# Patient Record
Sex: Male | Born: 1955 | Race: White | Hispanic: No | Smoking: Former smoker
Health system: Southern US, Community
[De-identification: ages and names within clinical notes are randomized; demographics above are authoritative.]

## PROBLEM LIST (undated history)

## (undated) DIAGNOSIS — I10 Essential (primary) hypertension: Secondary | ICD-10-CM

## (undated) DIAGNOSIS — E785 Hyperlipidemia, unspecified: Secondary | ICD-10-CM

---

## 2006-07-19 ENCOUNTER — Encounter: Payer: Self-pay | Admitting: General Practice

## 2006-07-20 ENCOUNTER — Encounter: Payer: Self-pay | Admitting: General Practice

## 2006-08-20 ENCOUNTER — Encounter: Payer: Self-pay | Admitting: General Practice

## 2006-09-19 ENCOUNTER — Encounter: Payer: Self-pay | Admitting: General Practice

## 2019-02-18 ENCOUNTER — Other Ambulatory Visit: Payer: Self-pay

## 2019-02-18 DIAGNOSIS — Z20822 Contact with and (suspected) exposure to covid-19: Secondary | ICD-10-CM

## 2019-02-21 LAB — NOVEL CORONAVIRUS, NAA: SARS-CoV-2, NAA: DETECTED — AB

## 2019-02-22 ENCOUNTER — Encounter: Payer: Self-pay | Admitting: Emergency Medicine

## 2019-02-22 ENCOUNTER — Emergency Department
Admission: EM | Admit: 2019-02-22 | Discharge: 2019-02-22 | Disposition: A | Discharge status: Other Acute Inpatient Hospital | Payer: Medicaid Other | Attending: Emergency Medicine | Admitting: Emergency Medicine

## 2019-02-22 ENCOUNTER — Emergency Department: Payer: Medicaid Other

## 2019-02-22 ENCOUNTER — Other Ambulatory Visit: Payer: Self-pay

## 2019-02-22 ENCOUNTER — Encounter (HOSPITAL_COMMUNITY): Payer: Self-pay | Admitting: Family Medicine

## 2019-02-22 ENCOUNTER — Inpatient Hospital Stay (HOSPITAL_COMMUNITY)
Admission: AD | Admit: 2019-02-22 | Discharge: 2019-03-22 | DRG: 207 | Disposition: E | Payer: Medicaid Other | Source: Other Acute Inpatient Hospital | Attending: Pulmonary Disease | Admitting: Pulmonary Disease

## 2019-02-22 DIAGNOSIS — R042 Hemoptysis: Secondary | ICD-10-CM | POA: Diagnosis not present

## 2019-02-22 DIAGNOSIS — Z6830 Body mass index (BMI) 30.0-30.9, adult: Secondary | ICD-10-CM

## 2019-02-22 DIAGNOSIS — Z23 Encounter for immunization: Secondary | ICD-10-CM

## 2019-02-22 DIAGNOSIS — D696 Thrombocytopenia, unspecified: Secondary | ICD-10-CM | POA: Diagnosis not present

## 2019-02-22 DIAGNOSIS — J159 Unspecified bacterial pneumonia: Secondary | ICD-10-CM | POA: Diagnosis not present

## 2019-02-22 DIAGNOSIS — D649 Anemia, unspecified: Secondary | ICD-10-CM | POA: Diagnosis present

## 2019-02-22 DIAGNOSIS — Z01818 Encounter for other preprocedural examination: Secondary | ICD-10-CM

## 2019-02-22 DIAGNOSIS — Z66 Do not resuscitate: Secondary | ICD-10-CM | POA: Diagnosis not present

## 2019-02-22 DIAGNOSIS — J1281 Pneumonia due to SARS-associated coronavirus: Secondary | ICD-10-CM | POA: Diagnosis not present

## 2019-02-22 DIAGNOSIS — J1289 Other viral pneumonia: Secondary | ICD-10-CM | POA: Diagnosis present

## 2019-02-22 DIAGNOSIS — I10 Essential (primary) hypertension: Secondary | ICD-10-CM | POA: Diagnosis present

## 2019-02-22 DIAGNOSIS — I493 Ventricular premature depolarization: Secondary | ICD-10-CM | POA: Diagnosis not present

## 2019-02-22 DIAGNOSIS — R040 Epistaxis: Secondary | ICD-10-CM | POA: Diagnosis not present

## 2019-02-22 DIAGNOSIS — Z515 Encounter for palliative care: Secondary | ICD-10-CM | POA: Diagnosis not present

## 2019-02-22 DIAGNOSIS — U071 COVID-19: Secondary | ICD-10-CM | POA: Insufficient documentation

## 2019-02-22 DIAGNOSIS — I82441 Acute embolism and thrombosis of right tibial vein: Secondary | ICD-10-CM | POA: Diagnosis present

## 2019-02-22 DIAGNOSIS — R0602 Shortness of breath: Secondary | ICD-10-CM | POA: Diagnosis present

## 2019-02-22 DIAGNOSIS — J9601 Acute respiratory failure with hypoxia: Secondary | ICD-10-CM | POA: Diagnosis present

## 2019-02-22 DIAGNOSIS — R7989 Other specified abnormal findings of blood chemistry: Secondary | ICD-10-CM | POA: Diagnosis not present

## 2019-02-22 DIAGNOSIS — J1282 Pneumonia due to coronavirus disease 2019: Secondary | ICD-10-CM | POA: Diagnosis present

## 2019-02-22 DIAGNOSIS — E669 Obesity, unspecified: Secondary | ICD-10-CM | POA: Diagnosis present

## 2019-02-22 DIAGNOSIS — E1165 Type 2 diabetes mellitus with hyperglycemia: Secondary | ICD-10-CM | POA: Diagnosis present

## 2019-02-22 DIAGNOSIS — R9431 Abnormal electrocardiogram [ECG] [EKG]: Secondary | ICD-10-CM | POA: Diagnosis not present

## 2019-02-22 DIAGNOSIS — E874 Mixed disorder of acid-base balance: Secondary | ICD-10-CM | POA: Diagnosis present

## 2019-02-22 DIAGNOSIS — Z79899 Other long term (current) drug therapy: Secondary | ICD-10-CM

## 2019-02-22 DIAGNOSIS — J80 Acute respiratory distress syndrome: Secondary | ICD-10-CM

## 2019-02-22 DIAGNOSIS — J982 Interstitial emphysema: Secondary | ICD-10-CM | POA: Diagnosis not present

## 2019-02-22 DIAGNOSIS — R34 Anuria and oliguria: Secondary | ICD-10-CM | POA: Diagnosis not present

## 2019-02-22 DIAGNOSIS — E785 Hyperlipidemia, unspecified: Secondary | ICD-10-CM | POA: Diagnosis present

## 2019-02-22 DIAGNOSIS — D751 Secondary polycythemia: Secondary | ICD-10-CM | POA: Diagnosis not present

## 2019-02-22 DIAGNOSIS — N179 Acute kidney failure, unspecified: Secondary | ICD-10-CM | POA: Diagnosis not present

## 2019-02-22 DIAGNOSIS — J939 Pneumothorax, unspecified: Secondary | ICD-10-CM

## 2019-02-22 DIAGNOSIS — E871 Hypo-osmolality and hyponatremia: Secondary | ICD-10-CM | POA: Diagnosis present

## 2019-02-22 DIAGNOSIS — Z452 Encounter for adjustment and management of vascular access device: Secondary | ICD-10-CM

## 2019-02-22 DIAGNOSIS — Z9689 Presence of other specified functional implants: Secondary | ICD-10-CM

## 2019-02-22 DIAGNOSIS — R579 Shock, unspecified: Secondary | ICD-10-CM | POA: Diagnosis not present

## 2019-02-22 DIAGNOSIS — R0902 Hypoxemia: Secondary | ICD-10-CM

## 2019-02-22 HISTORY — DX: Essential (primary) hypertension: I10

## 2019-02-22 HISTORY — DX: Hyperlipidemia, unspecified: E78.5

## 2019-02-22 LAB — C-REACTIVE PROTEIN: CRP: 14.7 mg/dL — ABNORMAL HIGH (ref <1.0)

## 2019-02-22 LAB — CBC WITH DIFFERENTIAL/PLATELET
Abs Immature Granulocytes: 0.13 10*3/uL — ABNORMAL HIGH (ref 0.00–0.07)
Basophils Absolute: 0 10*3/uL (ref 0.0–0.1)
Basophils Relative: 0 %
Eosinophils Absolute: 0 10*3/uL (ref 0.0–0.5)
Eosinophils Relative: 0 %
HCT: 45 % (ref 39.0–52.0)
Hemoglobin: 16.1 g/dL (ref 13.0–17.0)
Immature Granulocytes: 1 %
Lymphocytes Relative: 8 %
Lymphs Abs: 0.7 10*3/uL (ref 0.7–4.0)
MCH: 29.2 pg (ref 26.0–34.0)
MCHC: 35.8 g/dL (ref 30.0–36.0)
MCV: 81.5 fL (ref 80.0–100.0)
Monocytes Absolute: 0.2 10*3/uL (ref 0.1–1.0)
Monocytes Relative: 2 %
Neutro Abs: 8.2 10*3/uL — ABNORMAL HIGH (ref 1.7–7.7)
Neutrophils Relative %: 89 %
Platelets: 213 10*3/uL (ref 150–400)
RBC: 5.52 MIL/uL (ref 4.22–5.81)
RDW: 12.5 % (ref 11.5–15.5)
Smear Review: NORMAL
WBC: 9.2 10*3/uL (ref 4.0–10.5)
nRBC: 0 % (ref 0.0–0.2)

## 2019-02-22 LAB — URINALYSIS, COMPLETE (UACMP) WITH MICROSCOPIC
Bacteria, UA: NONE SEEN
Bilirubin Urine: NEGATIVE
Glucose, UA: NEGATIVE mg/dL
Hgb urine dipstick: NEGATIVE
Ketones, ur: 20 mg/dL — AB
Leukocytes,Ua: NEGATIVE
Nitrite: NEGATIVE
Protein, ur: 100 mg/dL — AB
Specific Gravity, Urine: 1.029 (ref 1.005–1.030)
Squamous Epithelial / HPF: NONE SEEN (ref 0–5)
pH: 5 (ref 5.0–8.0)

## 2019-02-22 LAB — COMPREHENSIVE METABOLIC PANEL
ALT: 39 U/L (ref 0–44)
AST: 74 U/L — ABNORMAL HIGH (ref 15–41)
Albumin: 2.9 g/dL — ABNORMAL LOW (ref 3.5–5.0)
Alkaline Phosphatase: 97 U/L (ref 38–126)
Anion gap: 16 — ABNORMAL HIGH (ref 5–15)
BUN: 19 mg/dL (ref 8–23)
CO2: 21 mmol/L — ABNORMAL LOW (ref 22–32)
Calcium: 8 mg/dL — ABNORMAL LOW (ref 8.9–10.3)
Chloride: 85 mmol/L — ABNORMAL LOW (ref 98–111)
Creatinine, Ser: 0.87 mg/dL (ref 0.61–1.24)
GFR calc Af Amer: >60 mL/min (ref >60)
GFR calc non Af Amer: >60 mL/min (ref >60)
Glucose, Bld: 140 mg/dL — ABNORMAL HIGH (ref 70–99)
Potassium: 4 mmol/L (ref 3.5–5.1)
Sodium: 122 mmol/L — ABNORMAL LOW (ref 135–145)
Total Bilirubin: 1.1 mg/dL (ref 0.3–1.2)
Total Protein: 7.3 g/dL (ref 6.5–8.1)

## 2019-02-22 LAB — OSMOLALITY, URINE: Osmolality, Ur: 877 mOsm/kg (ref 300–900)

## 2019-02-22 LAB — PROCALCITONIN: Procalcitonin: 0.28 ng/mL

## 2019-02-22 LAB — SODIUM, URINE, RANDOM: Sodium, Ur: <10 mmol/L

## 2019-02-22 LAB — FIBRIN DERIVATIVES D-DIMER (ARMC ONLY): Fibrin derivatives D-dimer (ARMC): 4705.69 ng/mL (FEU) — ABNORMAL HIGH (ref 0.00–499.00)

## 2019-02-22 LAB — LACTIC ACID, PLASMA: Lactic Acid, Venous: 1.9 mmol/L (ref 0.5–1.9)

## 2019-02-22 LAB — TROPONIN I (HIGH SENSITIVITY)
Troponin I (High Sensitivity): 18 ng/L — ABNORMAL HIGH (ref <18)
Troponin I (High Sensitivity): 21 ng/L — ABNORMAL HIGH (ref <18)

## 2019-02-22 LAB — BRAIN NATRIURETIC PEPTIDE: B Natriuretic Peptide: 53 pg/mL (ref 0.0–100.0)

## 2019-02-22 LAB — FERRITIN: Ferritin: 1419 ng/mL — ABNORMAL HIGH (ref 24–336)

## 2019-02-22 MED ORDER — SODIUM CHLORIDE 0.9% FLUSH
3.0000 mL | Freq: Two times a day (BID) | INTRAVENOUS | Status: DC
  Administered 2019-02-22 – 2019-03-16 (×43): 3 mL via INTRAVENOUS

## 2019-02-22 MED ORDER — SODIUM CHLORIDE 0.9 % IV SOLN: 200.0000 mg | Freq: Once | INTRAVENOUS | Status: DC

## 2019-02-22 MED ORDER — ALPRAZOLAM 0.5 MG PO TABS
0.5000 mg | ORAL_TABLET | Freq: Every evening | ORAL | Status: DC | PRN
  Administered 2019-02-23: 0.5 mg via ORAL
  Filled 2019-02-22: qty 1

## 2019-02-22 MED ORDER — DEXAMETHASONE SODIUM PHOSPHATE 10 MG/ML IJ SOLN
6.0000 mg | INTRAMUSCULAR | Status: DC
  Administered 2019-02-23 – 2019-03-05 (×11): 6 mg via INTRAVENOUS
  Filled 2019-02-22 (×10): qty 1

## 2019-02-22 MED ORDER — ENOXAPARIN SODIUM 40 MG/0.4ML ~~LOC~~ SOLN: 40.0000 mg | Freq: Two times a day (BID) | SUBCUTANEOUS | Status: DC

## 2019-02-22 MED ORDER — LABETALOL HCL 5 MG/ML IV SOLN: 10.0000 mg | INTRAVENOUS | Status: DC | PRN

## 2019-02-22 MED ORDER — ACETAMINOPHEN 325 MG PO TABS
650.0000 mg | ORAL_TABLET | Freq: Four times a day (QID) | ORAL | Status: DC | PRN
  Administered 2019-02-28 – 2019-03-06 (×5): 650 mg via ORAL
  Filled 2019-02-22 (×5): qty 2

## 2019-02-22 MED ORDER — SODIUM CHLORIDE 0.9 % IV SOLN
100.0000 mg | Freq: Every day | INTRAVENOUS | Status: AC
  Administered 2019-02-23 – 2019-02-26 (×4): 100 mg via INTRAVENOUS
  Filled 2019-02-22 (×5): qty 20

## 2019-02-22 MED ORDER — ONDANSETRON HCL 4 MG PO TABS: 4.0000 mg | ORAL_TABLET | Freq: Four times a day (QID) | ORAL | Status: DC | PRN

## 2019-02-22 MED ORDER — SODIUM CHLORIDE 0.9 % IV SOLN
100.0000 mg | Freq: Every day | INTRAVENOUS | Status: DC
  Filled 2019-02-22: qty 20

## 2019-02-22 MED ORDER — ROSUVASTATIN CALCIUM 20 MG PO TABS
20.0000 mg | ORAL_TABLET | Freq: Every day | ORAL | Status: DC
  Administered 2019-02-23 – 2019-03-07 (×13): 20 mg via ORAL
  Filled 2019-02-22: qty 1
  Filled 2019-02-22: qty 4
  Filled 2019-02-22: qty 1
  Filled 2019-02-22: qty 4
  Filled 2019-02-22: qty 1
  Filled 2019-02-22: qty 4
  Filled 2019-02-22 (×4): qty 1
  Filled 2019-02-22 (×5): qty 4

## 2019-02-22 MED ORDER — SODIUM CHLORIDE 0.9 % IV SOLN
2.0000 g | INTRAVENOUS | Status: AC
  Administered 2019-02-22 – 2019-02-26 (×5): 2 g via INTRAVENOUS
  Filled 2019-02-22 (×5): qty 20

## 2019-02-22 MED ORDER — ENOXAPARIN SODIUM 40 MG/0.4ML ~~LOC~~ SOLN: 40.0000 mg | SUBCUTANEOUS | Status: DC

## 2019-02-22 MED ORDER — AZITHROMYCIN 250 MG PO TABS
500.0000 mg | ORAL_TABLET | Freq: Every day | ORAL | Status: DC
  Administered 2019-02-22: 500 mg via ORAL
  Filled 2019-02-22: qty 2

## 2019-02-22 MED ORDER — SODIUM CHLORIDE 0.9% FLUSH
3.0000 mL | Freq: Two times a day (BID) | INTRAVENOUS | Status: DC
  Administered 2019-02-22 – 2019-02-23 (×2): 3 mL via INTRAVENOUS

## 2019-02-22 MED ORDER — ONDANSETRON HCL 4 MG/2ML IJ SOLN: 4.0000 mg | Freq: Four times a day (QID) | INTRAMUSCULAR | Status: DC | PRN

## 2019-02-22 MED ORDER — DEXAMETHASONE SODIUM PHOSPHATE 10 MG/ML IJ SOLN
10.0000 mg | Freq: Once | INTRAMUSCULAR | Status: AC
  Administered 2019-02-22: 10 mg via INTRAVENOUS
  Filled 2019-02-22: qty 1

## 2019-02-22 MED ORDER — ENOXAPARIN SODIUM 40 MG/0.4ML ~~LOC~~ SOLN
40.0000 mg | SUBCUTANEOUS | Status: DC
  Administered 2019-02-22: 40 mg via SUBCUTANEOUS
  Filled 2019-02-22: qty 0.4

## 2019-02-22 MED ORDER — DOXYCYCLINE HYCLATE 100 MG PO TABS
100.0000 mg | ORAL_TABLET | Freq: Two times a day (BID) | ORAL | Status: DC
  Administered 2019-02-23 (×2): 100 mg via ORAL
  Filled 2019-02-22 (×3): qty 1

## 2019-02-22 MED ORDER — SODIUM CHLORIDE 0.9 % IV SOLN: 250.0000 mL | INTRAVENOUS | Status: DC | PRN

## 2019-02-22 MED ORDER — SODIUM CHLORIDE 0.9 % IV SOLN: 100.0000 mg | Freq: Every day | INTRAVENOUS | Status: DC

## 2019-02-22 MED ORDER — SODIUM CHLORIDE 0.9 % IV SOLN
200.0000 mg | Freq: Once | INTRAVENOUS | Status: AC
  Administered 2019-02-22: 200 mg via INTRAVENOUS
  Filled 2019-02-22: qty 40

## 2019-02-22 MED ORDER — HYDROCODONE-ACETAMINOPHEN 5-325 MG PO TABS
1.0000 | ORAL_TABLET | ORAL | Status: DC | PRN
  Administered 2019-03-06: 1 via ORAL
  Filled 2019-02-22: qty 1

## 2019-02-22 MED ORDER — SODIUM CHLORIDE 0.9% FLUSH: 3.0000 mL | INTRAVENOUS | Status: DC | PRN

## 2019-02-22 NOTE — ED Notes (Addendum)
Pt placed back on non rebreather at 15L due to oxygen sat decreasing to 79% on high flow. Pt oxygen sat increased to 90% on non rebreather

## 2019-02-22 NOTE — Progress Notes (Signed)
Anticoagulation monitoring(Lovenox):  63yo  M ordered Lovenox 40 mg Q24h  Filed Weights   Mar 11, 2019 1224  Weight: 190 lb (86.2 kg)   BMI   Lab Results  Component Value Date   CREATININE 0.87 Mar 11, 2019   Estimated Creatinine Clearance: 86.6 mL/min (by C-G formula based on SCr of 0.87 mg/dL). Hemoglobin & Hematocrit     Component Value Date/Time   HGB 16.1 March 11, 2019 1234   HCT 45.0 03-11-2019 1234     PerCovid-19 Protocol for Patient with estCrcl > 30 ml/min and Fibrin D-Dimer>[address] [facility]) and discussion with ED MD, will transition to Lovenox 40 mg Q12h.     Chinita Greenland PharmD Clinical Pharmacist 11-Mar-2019

## 2019-02-22 NOTE — ED Triage Notes (Signed)
Pt in via POV, reports worsening shortness of breath, pt is Covid positive.  Room air saturation 67% in triage.  Pt placed on 6L nasal cannula, roomed at this time.  EDP, [place] notified and to bedside.

## 2019-02-22 NOTE — Treatment Plan (Addendum)
[facility] to [place] 63 yo with hx HTN and HLD who presented to ED with SOB.  COVID + as of 11/30 with subsequent worsening over the past few days.  Found to have O2 sats in the 60's on RA on arrival.  Per EDP, looks more comfortable on 6 L at this time.  Vitals notable for tachypnea to 24.  Normal HR.  Afebrile.  BP on the high side.  Labs notable for hyponatremia.  Mildly elevated AST.  CBC wnl.  Mild AGMA.  Procalcitonin pending.  D dimer elevated to 4705.69 (upper limit normal 499).  Troponin mildly elevated, delta pending.  S/p steroids from EDP.  Requested remdesivir and dose of prophylactic lovenox. If patient stable for CT PE protocol, requested this as well.  Regarding hyponatremia, pt appears euvolemic per EDP.  Will order urine studies and serum osm.  Also requested lactate. Can consider gentle bolus and repeat sodium based on this and exam with mild AGMA.  Accepted to [place].    Addendum: Pt apparently desatted to 39's on 6 L [place].  Placed on NRB and now apparently in the low 90's.  He's been accepted to [facility] progressive bed at [facility].  Given worsening status and unclear timeline for transfer, I've asked one of my colleagues at [facility] to see and evaluate Mr. Eberwein for additional therapies (convalescent plasma +/- actemra) while he awaits transfer.

## 2019-02-22 NOTE — ED Notes (Signed)
Pt placed on nonrebreather.  

## 2019-02-22 NOTE — ED Notes (Signed)
Pt placed on high flow by respiratory at this time

## 2019-02-22 NOTE — Progress Notes (Signed)
Remdesivir - Pharmacy Brief Note   O:  ALT: 39 CXR: Diffuse interstitial and airspace opacity throughout both lungs. SpO2: 67% on room air   A/P:  Remdesivir 200 mg IVPB once followed by 100 mg IVPB daily x 4 days.   .me 03/04/2019 2:19 PM

## 2019-02-22 NOTE — Plan of Care (Signed)
[facility] PROGRESS NOTE  Patient: Zariah Jost GUR:[id number]   PCP: Patient, No Pcp Per DOB: 03-20-1956   DOA: 02/21/2019   DOS: 02/21/2019    Subjective: Requested by Dr. Florene Glen to see the patient while waiting for transfer to [place]. Patient continues to have shortness of breath. Denies any chest pain. No nausea or vomiting.  Objective:  Vitals:   03/08/2019 1615 03/01/2019 1627  BP:    Pulse: 95 95  Resp:    Temp:    SpO2: 90% 91%    Bilateral crackles. S1-S2 present.  Assessment and plan: COVID-19 infection. Acute hypoxic respiratory failure. Patient is on remdesivir as well as received 10 mg of Decadron. Discussed with patient regarding potential future fitment option including Actemra as well as convalescent plasma. Provided patient the consent form as well. Patient currently wants to think about the medicines as well as plasma and unsure about his decision. Informed him that he should discuss with his family and next of kin regarding his preferences so that in the event of acute worsening appropriate decisions are made. All questions answered.  Author: Berle Mull, MD [facility] 03/07/2019 4:34 PM   If 7PM-7AM, please contact night-coverage at [email or web address]

## 2019-02-22 NOTE — H&P (Signed)
History and Physical    Alexander Erickson [id number] DOB: Dec 15, 1955 DOA: 02/21/2019  PCP: Patient, No Pcp Per   Patient coming from: Home   Chief Complaint: SOB   HPI: Alexander Erickson is a 63 y.o. male with medical history significant for hypertension and hyperlipidemia, presenting to the emergency department with several days of cough and progressive shortness of breath.  Patient reports he been in his usual state of health until approximately 1 week ago when he developed cough and some mild dyspnea.  He went on to develop intermittent fevers and chills, and has had progression in his shortness of breath that prompted presentation to the ED today.  Patient's cough has been productive of thick sputum.  He denies any chest pain and denies any swelling or tenderness in the lower extremities.     [facility] ED Course: Upon arrival to the ED, patient is found to be afebrile, saturating in the upper 60s on room air, tachypneic in the low 30s, and with stable blood pressure.  EKG features a sinus rhythm with PVCs and QTc interval of 536 ms.  Chemistry panel is notable for a sodium of 122 and albumin 2.9, as well as AST 74.  CBC is unremarkable.  Lactic acid is normal.  High-sensitivity troponin is slightly elevated and flat.  Urinalysis notable for proteinuria and ketonuria.  Procalcitonin was 0.28, COVID-19 positive, urine osm 877, and urine sodium undetectably low.  Patient was treated with Decadron and remdesivir in the emergency department and started on supplemental oxygen.  Review of Systems:  All other systems reviewed and apart from HPI, are negative.  Past Medical History:  Diagnosis Date  . Hyperlipidemia   . Hypertension     History reviewed. No pertinent surgical history.   has no history on file for tobacco, alcohol, and drug.  No Known Allergies  History reviewed. No pertinent family history.   Prior to Admission medications   Medication Sig Start Date End  Date Taking? Authorizing Provider  ALPRAZolam Prudy Feeler(XANAX) 0.5 MG tablet Take 0.5 mg by mouth at bedtime as needed for anxiety.    [provider]  rosuvastatin (CRESTOR) 20 MG tablet Take 20 mg by mouth daily.    [provider]    Physical Exam: Vitals:   02/28/2019 1906 03/20/2019 1945 02/19/2019 2000  BP: (!) 161/80 (!) 150/83 (!) 167/90  Pulse: 91 90 91  Resp: (!) 21 (!) 25 (!) 22  Temp: 99.6 F (37.6 C)  98.5 F (36.9 C)  TempSrc: Oral  Oral  SpO2: (!) 89% (!) 87% 91%  Weight:   85 kg  Height:   5' 5.5" (1.664 m)    Constitutional: NAD, calm  Eyes: PERTLA, lids and conjunctivae normal ENMT: Mucous membranes are moist. Posterior pharynx clear of any exudate or lesions.   Neck: normal, supple, no masses, no thyromegaly Respiratory: Tachypneic. Speaking full sentences. No pallor or cyanosis.  Cardiovascular: S1 & S2 heard, regular rate and rhythm. No extremity edema.   Abdomen: No distension, no tenderness, soft. Bowel sounds active.  Musculoskeletal: no clubbing / cyanosis. No joint deformity upper and lower extremities.    Skin: no significant rashes, lesions, ulcers. Warm, dry, well-perfused. Neurologic: No gross facial asymmetry. Sensation intact. Moving all extremities.  Psychiatric: Alert and oriented to person, place, and situation. Pleasant, cooperative.     Labs on Admission: I have personally reviewed following labs and imaging studies  CBC: Recent Labs  Lab 03/03/2019 1234  WBC 9.2  NEUTROABS 8.2*  HGB 16.1  HCT 45.0  MCV 81.5  PLT 213   Basic Metabolic Panel: Recent Labs  Lab 03/20/2019 1234  NA 122*  K 4.0  CL 85*  CO2 21*  GLUCOSE 140*  BUN 19  CREATININE 0.87  CALCIUM 8.0*   GFR: Estimated Creatinine Clearance: 86.9 mL/min (by C-G formula based on SCr of 0.87 mg/dL). Liver Function Tests: Recent Labs  Lab 02/21/2019 1234  AST 74*  ALT 39  ALKPHOS 97  BILITOT 1.1  PROT 7.3  ALBUMIN 2.9*   No results for input(s): LIPASE,  AMYLASE in the last 168 hours. No results for input(s): AMMONIA in the last 168 hours. Coagulation Profile: No results for input(s): INR, PROTIME in the last 168 hours. Cardiac Enzymes: No results for input(s): CKTOTAL, CKMB, CKMBINDEX, TROPONINI in the last 168 hours. BNP (last 3 results) No results for input(s): PROBNP in the last 8760 hours. HbA1C: No results for input(s): HGBA1C in the last 72 hours. CBG: No results for input(s): GLUCAP in the last 168 hours. Lipid Profile: No results for input(s): CHOL, HDL, LDLCALC, TRIG, CHOLHDL, LDLDIRECT in the last 72 hours. Thyroid Function Tests: No results for input(s): TSH, T4TOTAL, FREET4, T3FREE, THYROIDAB in the last 72 hours. Anemia Panel: Recent Labs    03/16/2019 1234  FERRITIN 1,419*   Urine analysis:    Component Value Date/Time   COLORURINE YELLOW (A) 03/16/2019 1436   APPEARANCEUR CLEAR (A) 03/01/2019 1436   LABSPEC 1.029 02/21/2019 1436   PHURINE 5.0 03/13/2019 1436   GLUCOSEU NEGATIVE 03/18/2019 1436   HGBUR NEGATIVE 03/21/2019 1436   BILIRUBINUR NEGATIVE 03/07/2019 1436   KETONESUR 20 (A) 03/14/2019 1436   PROTEINUR 100 (A) 03/02/2019 1436   NITRITE NEGATIVE 03/02/2019 1436   LEUKOCYTESUR NEGATIVE 03/04/2019 1436   Sepsis Labs: @LABRCNTIP (procalcitonin:4,lacticidven:4) ) Recent Results (from the past 240 hour(s))  Novel Coronavirus, NAA ([facility])     Status: Abnormal   Collection Time: 02/18/19  1:26 PM   Specimen: Nasopharyngeal(NP) swabs in vial transport medium   NASOPHARYNGE  TESTING  Result Value Ref Range Status   SARS-CoV-2, NAA Detected (A) Not Detected Final    Comment: This nucleic acid amplification test was developed and its performance characteristics determined by 02/20/19. Nucleic acid amplification tests include PCR and TMA. This test has not been FDA cleared or approved. This test has been authorized by FDA under an Emergency Use Authorization (EUA). This test is only authorized  for the duration of time the declaration that circumstances exist justifying the authorization of the emergency use of in vitro diagnostic tests for detection of SARS-CoV-2 virus and/or diagnosis of COVID-19 infection under section 564(b)(1) of the Act, 21 U.S.C. [employer]) (1), unless the authorization is terminated or revoked sooner. When diagnostic testing is negative, the possibility of a false negative result should be considered in the context of a patient's recent exposures and the presence of clinical signs and symptoms consistent with COVID-19. An individual without symptoms of COVID-19 and who is not shedding SARS-CoV-2 virus would  expect to have a negative (not detected) result in this assay.      Radiological Exams on Admission: Dg Chest Portable 1 View  Result Date: 02/23/2019 CLINICAL DATA:  Shortness of breath, COVID positive EXAM: PORTABLE CHEST 1 VIEW COMPARISON:  None. FINDINGS: Cardiomediastinal contours are normal. There is diffuse interstitial and airspace opacity throughout both the right and left chest. No signs of dense consolidation or evidence of pleural effusion. No acute bone finding.  IMPRESSION: Diffuse interstitial and airspace opacity throughout both lungs. Findings which could be seen in viral or atypical pneumonia, also with COVID-19 infection. Asymmetric pulmonary edema could have similar appearance. Electronically Signed   By: Zetta Bills M.D.   On: 03/07/2019 13:01    EKG: Independently reviewed. Sinus rhythm, PVC's, LAD, QTc 536 ms.   Assessment/Plan   1. COVID-19 pneumonia; acute hypoxic respiratory failure  - Presents with 1 week of cough and SOB, found to have O2 saturation 67% on rm air with diffuse opacities bilaterally on CXR, was started on supplemental O2 and treated with dexamethasone and remdesivir in ED  - Continue supplemental O2, continue Decadron and remdesivir, start antibiotics and trend procalcitonin given productive cough and  elevated pct   2. 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 Prolonged QT interval  - QTc is 536 ms on admission  - Continue cardiac monitoring, minimize QT-prolonging medications, replace potassium to 4 and mag to 2, repeat EKG in am    DVT prophylaxis: Lovenox  Code Status: Full, confirmed with patient  Family Communication: Discussed with patient  Consults called: None  Admission status: Inpatient     Vianne Bulls, MD [facility] Pager [phone]  If 7PM-7AM, please contact night-coverage [email or web address] Password [email or web address]  03/16/2019, 9:00 PM

## 2019-02-22 NOTE — ED Provider Notes (Signed)
[facility] Emergency Department Provider Note   ____________________________________________   First MD Initiated Contact with Patient 02-28-19 1227     (approximate)  I have reviewed the triage vital signs and the nursing notes.   HISTORY  Chief Complaint Respiratory Distress and Covid    HPI Alexander Erickson is a 63 y.o. male with past medical history of hypertension and hyperlipidemia who presents to the ED complaining of shortness of breath.  Patient states he initially had some cough and difficulty breathing about a week ago, subsequently tested positive for COVID-19 5 days prior.  He states he has been having intermittent fever since then and his breathing has seemed to gradually get worse.  He describes the shortness of breath as severe earlier today, prompting him to seek care in the ED.  He complains of a productive cough, but denies any chest pain.  He has not noticed any pain or swelling in his lower extremities.  He has not been taking any medications for COVID-19.        Past Medical History:  Diagnosis Date  . Hyperlipidemia   . Hypertension     There are no active problems to display for this patient.   History reviewed. No pertinent surgical history.  Prior to Admission medications   Medication Sig Start Date End Date Taking? Authorizing Provider  ALPRAZolam Duanne Moron) 0.5 MG tablet Take 0.5 mg by mouth at bedtime as needed for anxiety.   Yes [provider]  rosuvastatin (CRESTOR) 20 MG tablet Take 20 mg by mouth daily.   Yes [provider]    Allergies Patient has no known allergies.  No family history on file.  Social History Social History   Tobacco Use  . Smoking status: Not on file  Substance Use Topics  . Alcohol use: Not on file  . Drug use: Not on file    Review of Systems  Constitutional: Positive for fever/chills Eyes: No visual changes. ENT: No sore throat. Cardiovascular: Denies chest  pain. Respiratory: Positive for cough and shortness of breath. Gastrointestinal: No abdominal pain.  No nausea, no vomiting.  No diarrhea.  No constipation. Genitourinary: Negative for dysuria. Musculoskeletal: Negative for back pain. Skin: Negative for rash. Neurological: Negative for headaches, focal weakness or numbness.  ____________________________________________   PHYSICAL EXAM:  VITAL SIGNS: ED Triage Vitals  Enc Vitals Group     BP 2019-02-28 1214 (!) 148/64     Pulse Rate 02-28-2019 1214 100     Resp 02-28-2019 1214 (!) 28     Temp 28-Feb-2019 1214 98.3 F (36.8 C)     Temp Source 28-Feb-2019 1214 Oral     SpO2 2019/02/28 1214 (!) 68 %     Weight 2019/02/28 1224 190 lb (86.2 kg)     Height 02-28-2019 1224 5\' 5"  (1.651 m)     Head Circumference --      Peak Flow --      Pain Score 28-Feb-2019 1223 0     Pain Loc --      Pain Edu? --      Excl. in [place]? --     Constitutional: Alert and oriented. Eyes: Conjunctivae are normal. Head: Atraumatic. Nose: No congestion/rhinnorhea. Mouth/Throat: Mucous membranes are moist. Neck: Normal ROM Cardiovascular: Normal rate, regular rhythm. Grossly normal heart sounds. Respiratory: Tachypneic, moderate respiratory distress.  No retractions.  Diffuse crackles noted. Gastrointestinal: Soft and nontender. No distention. Genitourinary: deferred Musculoskeletal: No lower extremity tenderness nor edema. Neurologic:  Normal speech  and language. No gross focal neurologic deficits are appreciated. Skin:  Skin is warm, dry and intact. No rash noted. Psychiatric: Mood and affect are normal. Speech and behavior are normal.  ____________________________________________   LABS (all labs ordered are listed, but only abnormal results are displayed)  Labs Reviewed  CBC WITH DIFFERENTIAL/PLATELET - Abnormal; Notable for the following components:      Result Value   Neutro Abs 8.2 (*)    Abs Immature Granulocytes 0.13 (*)    All other components within  normal limits  COMPREHENSIVE METABOLIC PANEL - Abnormal; Notable for the following components:   Sodium 122 (*)    Chloride 85 (*)    CO2 21 (*)    Glucose, Bld 140 (*)    Calcium 8.0 (*)    Albumin 2.9 (*)    AST 74 (*)    Anion gap 16 (*)    All other components within normal limits  FIBRIN DERIVATIVES D-DIMER ([facility] ONLY) - Abnormal; Notable for the following components:   Fibrin derivatives D-dimer [facility]) 4,705.69 (*)    All other components within normal limits  FERRITIN - Abnormal; Notable for the following components:   Ferritin 1,419 (*)    All other components within normal limits  URINALYSIS, COMPLETE (UACMP) WITH MICROSCOPIC - Abnormal; Notable for the following components:   Color, Urine YELLOW (*)    APPearance CLEAR (*)    Ketones, ur 20 (*)    Protein, ur 100 (*)    All other components within normal limits  TROPONIN I (HIGH SENSITIVITY) - Abnormal; Notable for the following components:   Troponin I (High Sensitivity) 18 (*)    All other components within normal limits  BRAIN NATRIURETIC PEPTIDE  SODIUM, URINE, RANDOM  C-REACTIVE PROTEIN  OSMOLALITY, URINE  LACTIC ACID, PLASMA  OSMOLALITY  LACTIC ACID, PLASMA  PROCALCITONIN  TROPONIN I (HIGH SENSITIVITY)   ____________________________________________  EKG  ED ECG REPORT I, Chesley Noon, the attending physician, personally viewed and interpreted this ECG.   Date: 03/05/2019  EKG Time: 12:08  Rate: 100  Rhythm: sinus tachycardia  Axis: LAD  Intervals:Prolonged QTc  ST&T Change: PAC, No ST/T changes   PROCEDURES  Procedure(s) performed (including Critical Care):  .Critical Care Performed by: Chesley Noon, MD Authorized by: Chesley Noon, MD   Critical care provider statement:    Critical care time (minutes):  45   Critical care time was exclusive of:  Separately billable procedures and treating other patients and teaching time   Critical care was necessary to treat or prevent imminent  or life-threatening deterioration of the following conditions:  Respiratory failure   Critical care was time spent personally by me on the following activities:  Discussions with consultants, evaluation of patient's response to treatment, examination of patient, ordering and performing treatments and interventions, ordering and review of laboratory studies, ordering and review of radiographic studies, pulse oximetry, re-evaluation of patient's condition, obtaining history from patient or surrogate and review of old charts   I assumed direction of critical care for this patient from another provider in my specialty: no       ____________________________________________   INITIAL IMPRESSION / ASSESSMENT AND PLAN / ED COURSE       63 year old male with history of hypertension and hyperlipidemia presents to the ED with worsening shortness of breath in the setting of positive COVID-19 testing 5 days prior.  He arrives in some respiratory distress, placed on 6 L nasal cannula after room air sats noted to be  in the 60s.  Given time to rest on the stretcher, he had gradual improvement in his work of breathing and is maintaining O2 sats greater than 90% on 6 L nasal cannula.  EKG shows sinus tachycardia with PAC, no evidence of ischemia.  Chest x-ray appears consistent with COVID-19 pneumonia, low suspicion for bacterial illness but will screen procalcitonin.  [facility] of COVID-[facility], was given dose of Decadron given hypoxia.  Chest x-ray shows multifocal pneumonia consistent with COVID-19.  Labs otherwise significant for hyponatremia with no obvious cause, patient does appear euvolemic at this time.  He did have an increase in his oxygen requirement after standing up to urinate, noted to be hypoxic to 80% on 6 L nasal cannula.  He was transitioned to nonrebreather with eventual improvement in his O2 sats to 92%.  We will place him on high flow  nasal cannula for now but I suspect he will be stable for [facility].  Case was [facility] Low[facility]ell [facility] who [facility].  We considered performing CTA to assess for PE, however given his respiratory status we will hold off for now and start prophylactic dose Lovenox.      ____________________________________________   FINAL CLINICAL IMPRESSION(S) / ED DIAGNOSES  Final diagnoses:  Pneumonia due to COVID-19 virus  Acute respiratory failure with hypoxia [facility](HCC)     ED Discharge Orders    None       Note:  This document was prepared using Dragon voice recognition software and may include unintentional dictation errors.   Chesley NoonJessup, Rosalene Wardrop, MD 02/27/2019 [phone]

## 2019-02-22 NOTE — ED Notes (Signed)
Report given to [facility] 

## 2019-02-23 ENCOUNTER — Encounter (HOSPITAL_COMMUNITY): Payer: Self-pay

## 2019-02-23 LAB — PHOSPHORUS: Phosphorus: 3 mg/dL (ref 2.5–4.6)

## 2019-02-23 LAB — CBC WITH DIFFERENTIAL/PLATELET
Abs Immature Granulocytes: 0.12 10*3/uL — ABNORMAL HIGH (ref 0.00–0.07)
Basophils Absolute: 0 10*3/uL (ref 0.0–0.1)
Basophils Relative: 0 %
Eosinophils Absolute: 0 10*3/uL (ref 0.0–0.5)
Eosinophils Relative: 0 %
HCT: 42.4 % (ref 39.0–52.0)
Hemoglobin: 14.8 g/dL (ref 13.0–17.0)
Immature Granulocytes: 2 %
Lymphocytes Relative: 10 %
Lymphs Abs: 0.7 10*3/uL (ref 0.7–4.0)
MCH: 29.5 pg (ref 26.0–34.0)
MCHC: 34.9 g/dL (ref 30.0–36.0)
MCV: 84.6 fL (ref 80.0–100.0)
Monocytes Absolute: 0.2 10*3/uL (ref 0.1–1.0)
Monocytes Relative: 3 %
Neutro Abs: 6.5 10*3/uL (ref 1.7–7.7)
Neutrophils Relative %: 85 %
Platelets: 209 10*3/uL (ref 150–400)
RBC: 5.01 MIL/uL (ref 4.22–5.81)
RDW: 12.5 % (ref 11.5–15.5)
WBC: 7.5 10*3/uL (ref 4.0–10.5)
nRBC: 0 % (ref 0.0–0.2)

## 2019-02-23 LAB — EXPECTORATED SPUTUM ASSESSMENT W GRAM STAIN, RFLX TO RESP C

## 2019-02-23 LAB — COMPREHENSIVE METABOLIC PANEL
ALT: 41 U/L (ref 0–44)
AST: 66 U/L — ABNORMAL HIGH (ref 15–41)
Albumin: 2.4 g/dL — ABNORMAL LOW (ref 3.5–5.0)
Alkaline Phosphatase: 109 U/L (ref 38–126)
Anion gap: 16 — ABNORMAL HIGH (ref 5–15)
BUN: 20 mg/dL (ref 8–23)
CO2: 23 mmol/L (ref 22–32)
Calcium: 8.1 mg/dL — ABNORMAL LOW (ref 8.9–10.3)
Chloride: 86 mmol/L — ABNORMAL LOW (ref 98–111)
Creatinine, Ser: 0.77 mg/dL (ref 0.61–1.24)
GFR calc Af Amer: >60 mL/min (ref >60)
GFR calc non Af Amer: >60 mL/min (ref >60)
Glucose, Bld: 122 mg/dL — ABNORMAL HIGH (ref 70–99)
Potassium: 3.9 mmol/L (ref 3.5–5.1)
Sodium: 125 mmol/L — ABNORMAL LOW (ref 135–145)
Total Bilirubin: 0.8 mg/dL (ref 0.3–1.2)
Total Protein: 6.7 g/dL (ref 6.5–8.1)

## 2019-02-23 LAB — C-REACTIVE PROTEIN: CRP: 17 mg/dL — ABNORMAL HIGH (ref <1.0)

## 2019-02-23 LAB — CORTISOL: Cortisol, Plasma: 7.3 ug/dL

## 2019-02-23 LAB — FERRITIN: Ferritin: 951 ng/mL — ABNORMAL HIGH (ref 24–336)

## 2019-02-23 LAB — MAGNESIUM: Magnesium: 2.1 mg/dL (ref 1.7–2.4)

## 2019-02-23 LAB — PROCALCITONIN: Procalcitonin: 0.28 ng/mL

## 2019-02-23 LAB — ABO/RH: ABO/RH(D): O POS

## 2019-02-23 LAB — D-DIMER, QUANTITATIVE: D-Dimer, Quant: >20 ug/mL-FEU — ABNORMAL HIGH (ref 0.00–0.50)

## 2019-02-23 LAB — HIV ANTIBODY (ROUTINE TESTING W REFLEX): HIV Screen 4th Generation wRfx: NONREACTIVE

## 2019-02-23 LAB — TSH: TSH: 0.579 u[IU]/mL (ref 0.350–4.500)

## 2019-02-23 MED ORDER — INFLUENZA VAC SPLIT QUAD 0.5 ML IM SUSY
0.5000 mL | PREFILLED_SYRINGE | INTRAMUSCULAR | Status: DC | PRN
  Filled 2019-02-23: qty 0.5

## 2019-02-23 MED ORDER — SALINE SPRAY 0.65 % NA SOLN
1.0000 | NASAL | Status: DC | PRN
  Administered 2019-02-23 – 2019-03-07 (×2): 1 via NASAL
  Filled 2019-02-23 (×2): qty 44

## 2019-02-23 MED ORDER — FUROSEMIDE 10 MG/ML IJ SOLN
60.0000 mg | Freq: Once | INTRAMUSCULAR | Status: AC
  Administered 2019-02-23: 60 mg via INTRAVENOUS
  Filled 2019-02-23: qty 6

## 2019-02-23 MED ORDER — INFLUENZA VAC SPLIT QUAD 0.5 ML IM SUSY: 0.5000 mL | PREFILLED_SYRINGE | INTRAMUSCULAR | Status: DC

## 2019-02-23 MED ORDER — ENOXAPARIN SODIUM 100 MG/ML ~~LOC~~ SOLN
1.0000 mg/kg | Freq: Two times a day (BID) | SUBCUTANEOUS | Status: DC
  Administered 2019-02-23 – 2019-02-27 (×8): 85 mg via SUBCUTANEOUS
  Filled 2019-02-23 (×12): qty 1

## 2019-02-23 MED ORDER — METOPROLOL TARTRATE 5 MG/5ML IV SOLN
5.0000 mg | Freq: Once | INTRAVENOUS | Status: AC
  Administered 2019-02-23: 5 mg via INTRAVENOUS
  Filled 2019-02-23: qty 5

## 2019-02-23 MED ORDER — METOPROLOL TARTRATE 25 MG PO TABS
12.5000 mg | ORAL_TABLET | Freq: Two times a day (BID) | ORAL | Status: DC
  Administered 2019-02-23 – 2019-02-28 (×10): 12.5 mg via ORAL
  Administered 2019-02-28: 25 mg via ORAL
  Administered 2019-03-01 – 2019-03-10 (×17): 12.5 mg via ORAL
  Filled 2019-02-23 (×29): qty 1

## 2019-02-23 NOTE — Progress Notes (Signed)
ANTICOAGULATION CONSULT NOTE - Initial Consult  Pharmacy Consult for Lovenox Indication: Rule out VTE  Allergies  Allergen Reactions  . Sulfur   . Thimerosal     Patient Measurements: Height: 5' 5.5" (166.4 cm) Weight: 187 lb 6.3 oz (85 kg) IBW/kg (Calculated) : 62.65  Vital Signs: Temp: 99.1 F (37.3 C) (12/05 1253) Temp Source: Oral (12/05 1253) BP: 148/76 (12/05 1253) Pulse Rate: 97 (12/05 1253)  Labs: Recent Labs    03/06/2019 1234 03/09/2019 1437 02/23/19 0307  HGB 16.1  --  14.8  HCT 45.0  --  42.4  PLT 213  --  209  CREATININE 0.87  --  0.77  TROPONINIHS 18* 21*  --     Estimated Creatinine Clearance: 94.5 mL/min (by C-G formula based on SCr of 0.77 mg/dL).   Medical History: Past Medical History:  Diagnosis Date  . Hyperlipidemia   . Hypertension     Medications:  Scheduled:  . dexamethasone (DECADRON) injection  6 mg Intravenous Q24H  . enoxaparin (LOVENOX) injection  1 mg/kg Subcutaneous Q12H  . furosemide  60 mg Intravenous Once  . metoprolol tartrate  5 mg Intravenous Once  . metoprolol tartrate  12.5 mg Oral BID  . rosuvastatin  20 mg Oral q1800  . sodium chloride flush  3 mL Intravenous Q12H   Infusions:  . cefTRIAXone (ROCEPHIN)  IV Stopped (03/06/2019 2230)  . remdesivir 100 mg in NS 100 mL 100 mg (02/23/19 0940)   PRN: acetaminophen, ALPRAZolam, HYDROcodone-acetaminophen, influenza vac split quadrivalent PF, labetalol, sodium chloride  Assessment: 63 yo male with COVID-19 pneumonia.  He has been receiving Lovenox 40mg  q24h for prophylaxis.  Today, pharmacy is consulted to change to full-dose given clinically suspected PE/DVT w/ markedly elevated d-dimer and other concerning clinical signs.  Weight 85kg SCr 0.77, CrCl ~95 ml/min Hgb 14.8, Plts 209 No bleeding reported  Goal of Therapy:  Anti-Xa level 0.6-1 units/ml 4hrs after LMWH dose given Monitor platelets by anticoagulation protocol: Yes   Plan:  Lovenox 85mg  (1mg /kg) SQ  q12h Monitor CBC, signs/symtoms of bleeding  Peggyann Juba, PharmD, BCPS Pharmacy: [phone] 02/23/2019,5:01 PM

## 2019-02-23 NOTE — Progress Notes (Addendum)
Alexander Erickson  ZOX:[id number] DOB: 06/28/1955 DOA: 03/11/2019 PCP: Patient, No Pcp Per    Brief Narrative:  63 year old with a history of HTN and HLD who presented to the ED with a week of progressive cough and shortness of breath.  He lives in the [place] area with his brother who is also been symptomatic.  In the ED he was found to have saturations in the upper 60s on room air and was tachypneic.  EKG noted multiple PVCs.  His sodium was depressed at 122 and he was confirmed to be Covid positive.  A CXR noted diffuse dense interstitial infiltrates.  Significant Events: 11/30 COVID test + 12/4 admit to [facility] via [facility] ED  COVID-19 specific Treatment: Decadron 12/4 > Remdesivir 12/4 >  Subjective: The patient appears short of breath at the time of my exam but is not in extremis.  He is able to complete conversation without pausing.  He denies chest pain.  He does report some nausea and poor appetite with poor intake.  His primary complaint is his shortness of breath.  Assessment & Plan:  COVID Pneumonia - acute hypoxic resp failure  Continue Decadron and remdesivir -I have discussed the pros and cons of Actemra use with the patient and have encouraged him that I would like to use it at this time -the patient is concerned about potential side effects and wishes to avoid its use for now -he has likewise been counseled on convalescent plasma and is not yet willing to commit to this therapy  Recent Labs  Lab 03/01/2019 1234 03/08/2019 1437 02/23/19 0307  DDIMER  --   --  >20.00*  FERRITIN 1,419*  --  951*  CRP 14.7*  --  17.0*  ALT 39  --  41  PROCALCITON  --  0.28 0.28   Markedly elevated D-dimer Will empirically treat with full dose Lovenox for now - check venous duplex lower extremities -do not feel patient is clinically stable enough to obtain CT angio at this time  Hyponatremia Appears euvolemic on exam -urine sodium less than 10 is not consistent with SIADH -follow-up TSH  -diurese and follow  Frequent cardiac ectopy -PVCs Initiate beta-blocker -likely precipitated by respiratory distress -worrisome for possibility of PE  Prolonged QTC Check EKG in a.m.  DVT prophylaxis: Full dose Lovenox Code Status: FULL CODE Family Communication:  Disposition Plan:   Consultants:  none  Antimicrobials:  None  Objective: Blood pressure (!) 162/81, pulse 93, temperature 98.3 F (36.8 C), temperature source Oral, resp. rate 19, height 5' 5.5" (1.664 m), weight 85 kg, SpO2 (!) 88 %.  Intake/Output Summary (Last 24 hours) at 02/23/2019 1035 Last data filed at 02/23/2019 0500 Gross per 24 hour  Intake 460 ml  Output -  Net 460 ml   Filed Weights   03/16/2019 2000  Weight: 85 kg    Examination: General: Mild respiratory distress but not in extremis Lungs: Coarse crackles diffusely Cardiovascular: Regular rate and rhythm without murmur gallop or rub normal S1 and S2 Abdomen: Nontender, nondistended, soft, bowel sounds positive, no rebound, no ascites, no appreciable mass Extremities: No significant cyanosis, clubbing, or edema bilateral lower extremities  CBC: Recent Labs  Lab 02/27/2019 1234 02/23/19 0307  WBC 9.2 7.5  NEUTROABS 8.2* 6.5  HGB 16.1 14.8  HCT 45.0 42.4  MCV 81.5 84.6  PLT 213 811   Basic Metabolic Panel: Recent Labs  Lab 03/02/2019 1234 02/23/19 0307  NA 122* 125*  K 4.0 3.9  CL  85* 86*  CO2 21* 23  GLUCOSE 140* 122*  BUN 19 20  CREATININE 0.87 0.77  CALCIUM 8.0* 8.1*  MG  --  2.1  PHOS  --  3.0   GFR: Estimated Creatinine Clearance: 94.5 mL/min (by C-G formula based on SCr of 0.77 mg/dL).  Liver Function Tests: Recent Labs  Lab 03-03-2019 1234 02/23/19 0307  AST 74* 66*  ALT 39 41  ALKPHOS 97 109  BILITOT 1.1 0.8  PROT 7.3 6.7  ALBUMIN 2.9* 2.4*    Recent Results (from the past 240 hour(s))  Novel Coronavirus, NAA ([facility])     Status: Abnormal   Collection Time: 02/18/19  1:26 PM   Specimen: Nasopharyngeal(NP)  swabs in vial transport medium   NASOPHARYNGE  TESTING  Result Value Ref Range Status   SARS-CoV-2, NAA Detected (A) Not Detected Final    Comment: This nucleic acid amplification test was developed and its performance characteristics determined by [employer]. Nucleic acid amplification tests include PCR and TMA. This test has not been FDA cleared or approved. This test has been authorized by FDA under an Emergency Use Authorization (EUA). This test is only authorized for the duration of time the declaration that circumstances exist justifying the authorization of the emergency use of in vitro diagnostic tests for detection of SARS-CoV-2 virus and/or diagnosis of COVID-19 infection under section 564(b)(1) of the Act, 21 U.S.C. 315VVO-1(Y) (1), unless the authorization is terminated or revoked sooner. When diagnostic testing is negative, the possibility of a false negative result should be considered in the context of a patient's recent exposures and the presence of clinical signs and symptoms consistent with COVID-19. An individual without symptoms of COVID-19 and who is not shedding SARS-CoV-2 virus would  expect to have a negative (not detected) result in this assay.   Culture, sputum-assessment     Status: None   Collection Time: 02/23/19  5:00 AM   Specimen: Sputum  Result Value Ref Range Status   Specimen Description SPUTUM  Final   Special Requests NONE  Final   Sputum evaluation   Final    THIS SPECIMEN IS ACCEPTABLE FOR SPUTUM CULTURE Performed at [facility], [address], [place], [place] [address]    Report Status 02/23/2019 FINAL  Final     Scheduled Meds: . dexamethasone (DECADRON) injection  6 mg Intravenous Q24H  . doxycycline  100 mg Oral Q12H  . enoxaparin (LOVENOX) injection  40 mg Subcutaneous Q24H  . [START ON 02/24/2019] influenza vac split quadrivalent PF  0.5 mL Intramuscular Tomorrow-1000  . rosuvastatin  20 mg Oral q1800   . sodium chloride flush  3 mL Intravenous Q12H  . sodium chloride flush  3 mL Intravenous Q12H   Continuous Infusions: . sodium chloride    . cefTRIAXone (ROCEPHIN)  IV Stopped (03-Mar-2019 2230)  . remdesivir 100 mg in NS 100 mL 100 mg (02/23/19 0940)     LOS: 1 day   Lonia Blood, MD [facility] Office  [phone] Pager - Text Page per Amion  If 7PM-7AM, please contact night-coverage per Amion 02/23/2019, 10:35 AM

## 2019-02-24 ENCOUNTER — Inpatient Hospital Stay (HOSPITAL_COMMUNITY): Payer: Medicaid Other

## 2019-02-24 ENCOUNTER — Encounter (HOSPITAL_COMMUNITY): Payer: Self-pay

## 2019-02-24 DIAGNOSIS — R7989 Other specified abnormal findings of blood chemistry: Secondary | ICD-10-CM

## 2019-02-24 DIAGNOSIS — U071 COVID-19: Secondary | ICD-10-CM

## 2019-02-24 LAB — CBC WITH DIFFERENTIAL/PLATELET
Abs Immature Granulocytes: 0.12 10*3/uL — ABNORMAL HIGH (ref 0.00–0.07)
Basophils Absolute: 0 10*3/uL (ref 0.0–0.1)
Basophils Relative: 0 %
Eosinophils Absolute: 0 10*3/uL (ref 0.0–0.5)
Eosinophils Relative: 0 %
HCT: 46 % (ref 39.0–52.0)
Hemoglobin: 16 g/dL (ref 13.0–17.0)
Immature Granulocytes: 1 %
Lymphocytes Relative: 7 %
Lymphs Abs: 0.7 10*3/uL (ref 0.7–4.0)
MCH: 29.3 pg (ref 26.0–34.0)
MCHC: 34.8 g/dL (ref 30.0–36.0)
MCV: 84.2 fL (ref 80.0–100.0)
Monocytes Absolute: 0.2 10*3/uL (ref 0.1–1.0)
Monocytes Relative: 2 %
Neutro Abs: 8 10*3/uL — ABNORMAL HIGH (ref 1.7–7.7)
Neutrophils Relative %: 90 %
Platelets: 194 10*3/uL (ref 150–400)
RBC: 5.46 MIL/uL (ref 4.22–5.81)
RDW: 12.8 % (ref 11.5–15.5)
WBC: 9 10*3/uL (ref 4.0–10.5)
nRBC: 0 % (ref 0.0–0.2)

## 2019-02-24 LAB — PROCALCITONIN: Procalcitonin: 0.37 ng/mL

## 2019-02-24 LAB — MAGNESIUM: Magnesium: 2.2 mg/dL (ref 1.7–2.4)

## 2019-02-24 LAB — COMPREHENSIVE METABOLIC PANEL
ALT: 51 U/L — ABNORMAL HIGH (ref 0–44)
AST: 55 U/L — ABNORMAL HIGH (ref 15–41)
Albumin: 2.7 g/dL — ABNORMAL LOW (ref 3.5–5.0)
Alkaline Phosphatase: 154 U/L — ABNORMAL HIGH (ref 38–126)
Anion gap: 19 — ABNORMAL HIGH (ref 5–15)
BUN: 27 mg/dL — ABNORMAL HIGH (ref 8–23)
CO2: 25 mmol/L (ref 22–32)
Calcium: 8.4 mg/dL — ABNORMAL LOW (ref 8.9–10.3)
Chloride: 85 mmol/L — ABNORMAL LOW (ref 98–111)
Creatinine, Ser: 0.9 mg/dL (ref 0.61–1.24)
GFR calc Af Amer: >60 mL/min (ref >60)
GFR calc non Af Amer: >60 mL/min (ref >60)
Glucose, Bld: 122 mg/dL — ABNORMAL HIGH (ref 70–99)
Potassium: 4 mmol/L (ref 3.5–5.1)
Sodium: 129 mmol/L — ABNORMAL LOW (ref 135–145)
Total Bilirubin: 0.6 mg/dL (ref 0.3–1.2)
Total Protein: 7.2 g/dL (ref 6.5–8.1)

## 2019-02-24 LAB — FERRITIN: Ferritin: 1237 ng/mL — ABNORMAL HIGH (ref 24–336)

## 2019-02-24 LAB — PHOSPHORUS: Phosphorus: 3.3 mg/dL (ref 2.5–4.6)

## 2019-02-24 LAB — C-REACTIVE PROTEIN: CRP: 10.1 mg/dL — ABNORMAL HIGH (ref <1.0)

## 2019-02-24 LAB — D-DIMER, QUANTITATIVE: D-Dimer, Quant: >20 ug/mL-FEU — ABNORMAL HIGH (ref 0.00–0.50)

## 2019-02-24 MED ORDER — TOCILIZUMAB 400 MG/20ML IV SOLN
8.0000 mg/kg | Freq: Once | INTRAVENOUS | Status: AC
  Administered 2019-02-24: 680 mg via INTRAVENOUS
  Filled 2019-02-24: qty 34

## 2019-02-24 MED ORDER — ALPRAZOLAM 0.25 MG PO TABS
0.5000 mg | ORAL_TABLET | Freq: Three times a day (TID) | ORAL | Status: DC | PRN
  Administered 2019-02-24 – 2019-03-08 (×16): 0.5 mg via ORAL
  Filled 2019-02-24 (×17): qty 1

## 2019-02-24 MED ORDER — FUROSEMIDE 10 MG/ML IJ SOLN
60.0000 mg | Freq: Two times a day (BID) | INTRAMUSCULAR | Status: DC
  Administered 2019-02-24 – 2019-02-27 (×7): 60 mg via INTRAVENOUS
  Filled 2019-02-24 (×7): qty 6

## 2019-02-24 MED ORDER — PHENOL 1.4 % MT LIQD
1.0000 | OROMUCOSAL | Status: DC | PRN
  Administered 2019-02-24: 1 via OROMUCOSAL
  Filled 2019-02-24: qty 177

## 2019-02-24 NOTE — Progress Notes (Addendum)
Alexander Erickson  [id number]:[id number] DOB: January 10, 1956 DOA: 25-Feb-2019 PCP: Patient, No Pcp Per    Brief Narrative:  63 year old with a history of HTN and HLD who presented to the ED with a week of progressive cough and shortness of breath.  He lives in the [place] area with his brother who has also been symptomatic.  In the ED he was found to have saturations in the upper 60s on room air and was tachypneic.  EKG noted multiple PVCs.  His sodium was low at 122 and he was confirmed to be Covid positive.  A CXR noted diffuse dense interstitial infiltrates.  Significant Events: 11/30 COVID test + 12/4 admit to [facility] via [facility] ED  COVID-19 specific Treatment: Decadron 12/4 > Remdesivir 12/4 > Actemra 12/6  Subjective: The patient is now requiring high flow nasal cannula and nonrebreather mask to keep saturations in the upper 80s.  He is alert and oriented and also somewhat anxious.  He denies chest pain nausea or vomiting.  Assessment & Plan:  COVID Pneumonia - acute hypoxic resp failure  Continue Decadron and remdesivir -after lengthy discussion again today the patient has decided to proceed with Actemra -he remains at high risk for further decline in the next step will be transferring him to the ICU for heated high flow nasal cannula if he does not stabilize -we will continue to prone him as long as he can tolerate  Recent Labs  Lab 25-Feb-2019 1234 02/25/19 1437 02/23/19 0307 02/24/19 0219  DDIMER  --   --  >20.00* >20.00*  FERRITIN 1,419*  --  951* 1,237*  CRP 14.7*  --  17.0* 10.1*  ALT 39  --  41 51*  PROCALCITON  --  0.28 0.28 0.37   LE DVT - suspected PE  cont full dose Lovenox - venous duplex lower extremities confirmed DVT of the right leg - do not feel patient is clinically stable enough to obtain CT angio at this time  Possible bacterial pneumonia Procalcitonin is marginal -clinically the patient does not have features strongly suggestive of bacterial pneumonia -I am  continuing antibiotic therapy at the present time because he is severely ill and cannot afford to miss an opportunity to treat a bacterial infection however my index of suspicion is quite low and given the severity of his Covid induced respiratory distress I do not feel this should prohibit the use of Actemra in a desperate attempt to slow his decline  Hyponatremia urine sodium less than 10 is not consistent with SIADH -improving with diuresis -follow trend  Frequent cardiac ectopy -PVCs Initiated beta-blocker -likely precipitated by respiratory distress - worrisome for possibility of PE  Prolonged QTC  DVT prophylaxis: Full dose Lovenox Code Status: FULL CODE Family Communication:  Disposition Plan: Progressive care  Consultants:  none  Antimicrobials:  Azithromycin 12/4 Ceftriaxone 12/4 >  Objective: Blood pressure 139/61, pulse 90, temperature 97.9 F (36.6 C), temperature source Axillary, resp. rate 20, height 5' 5.5" (1.664 m), weight 85 kg, SpO2 (!) 88 %.  Intake/Output Summary (Last 24 hours) at 02/24/2019 0918 Last data filed at 02/24/2019 0600 Gross per 24 hour  Intake 340 ml  Output 900 ml  Net -560 ml   Filed Weights   2019-02-25 2000 02/24/19 0532  Weight: 85 kg 85 kg    Examination: General: Mild respiratory distress -pausing midsentence to breathe Lungs: Coarse crackles diffusely -no wheezing Cardiovascular: RRR with frequent ectopic beats Abdomen: NT/ND, soft, BS positive Extremities: No C/C/E B LE  CBC: Recent Labs  Lab 25-Mar-2018 1234 02/23/19 0307 02/24/19 0219  WBC 9.2 7.5 9.0  NEUTROABS 8.2* 6.5 8.0*  HGB 16.1 14.8 16.0  HCT 45.0 42.4 46.0  MCV 81.5 84.6 84.2  PLT 213 209 194   Basic Metabolic Panel: Recent Labs  Lab 25-Mar-2018 1234 02/23/19 0307 02/24/19 0219  NA 122* 125* 129*  K 4.0 3.9 4.0  CL 85* 86* 85*  CO2 21* 23 25  GLUCOSE 140* 122* 122*  BUN 19 20 27*  CREATININE 0.87 0.77 0.90  CALCIUM 8.0* 8.1* 8.4*  MG  --  2.1 2.2   PHOS  --  3.0 3.3   GFR: Estimated Creatinine Clearance: 84 mL/min (by C-G formula based on SCr of 0.9 mg/dL).  Liver Function Tests: Recent Labs  Lab 25-Mar-2018 1234 02/23/19 0307 02/24/19 0219  AST 74* 66* 55*  ALT 39 41 51*  ALKPHOS 97 109 154*  BILITOT 1.1 0.8 0.6  PROT 7.3 6.7 7.2  ALBUMIN 2.9* 2.4* 2.7*    Recent Results (from the past 240 hour(s))  Novel Coronavirus, NAA ([facility])     Status: Abnormal   Collection Time: 02/18/19  1:26 PM   Specimen: Nasopharyngeal(NP) swabs in vial transport medium   NASOPHARYNGE  TESTING  Result Value Ref Range Status   SARS-CoV-2, NAA Detected (A) Not Detected Final    Comment: This nucleic acid amplification test was developed and its performance characteristics determined by [facility] [employer] [facility]. Nucleic acid amplification tests include PCR and TMA. This test has not been FDA cleared or approved. This test has been authorized by FDA under an Emergency Use Authorization (EUA). This test is only authorized for the duration of time the declaration that circumstances exist justifying the authorization of the emergency use of in vitro diagnostic tests for detection of SARS-CoV-2 virus and/or diagnosis of COVID-19 infection under section 564(b)(1) of the Act, 21 U.S.C. 454UJW-1(X360bbb-3(b) (1), unless the authorization is terminated or revoked sooner. When diagnostic testing is negative, the possibility of a false negative result should be considered in the context of a patient's recent exposures and the presence of clinical signs and symptoms consistent with COVID-19. An individual without symptoms of COVID-19 and who is not shedding SARS-CoV-2 virus would  expect to have a negative (not detected) result in this assay.   Culture, sputum-assessment     Status: None   Collection Time: 02/23/19  5:00 AM   Specimen: Sputum  Result Value Ref Range Status   Specimen Description SPUTUM  Final   Special Requests NONE  Final   Sputum evaluation    Final    THIS SPECIMEN IS ACCEPTABLE FOR SPUTUM CULTURE Performed at [facility], [address] [place], [place] [address]    Report Status 02/23/2019 FINAL  Final  Culture, respiratory     Status: None (Preliminary result)   Collection Time: 02/23/19  5:00 AM   Specimen: SPU  Result Value Ref Range Status   Specimen Description   Final    SPUTUM Performed at [facility], [address] [place], [place] [address]    Special Requests   Final    NONE Reflexed from [id number] Performed at [facility], [address] [place], [place] [address]    Gram Stain   Final    RARE WBC PRESENT, PREDOMINANTLY PMN FEW GRAM POSITIVE COCCI IN CLUSTERS FEW GRAM NEGATIVE RODS FEW GRAM POSITIVE RODS Performed at [facility], [address] [place], [place] [address]  Culture PENDING  Incomplete   Report Status PENDING  Incomplete     Scheduled Meds: . dexamethasone (DECADRON) injection  6 mg Intravenous Q24H  . enoxaparin (LOVENOX) injection  1 mg/kg Subcutaneous Q12H  . metoprolol tartrate  12.5 mg Oral BID  . rosuvastatin  20 mg Oral q1800  . sodium chloride flush  3 mL Intravenous Q12H   Continuous Infusions: . cefTRIAXone (ROCEPHIN)  IV 2 g (02/23/19 2129)  . remdesivir 100 mg in NS 100 mL Stopped (02/23/19 1200)     LOS: 2 days   Lonia Blood, MD [facility] Office  [phone] Pager - Text Page per Amion  If 7PM-7AM, please contact night-coverage per Amion 02/24/2019, 9:18 AM

## 2019-02-24 NOTE — Progress Notes (Signed)
   02/24/19 0321  Provider Notification  Provider Name/Title Dr. Mitzi Hansen  Date Provider Notified 02/24/19  Time Provider Notified 0321  Notification Type Page  Notification Reason Requested by patient/family  Response See new orders  Date of Provider Response 02/24/19   Patient appears to be very fidgety.  He is hyperfixated on his oxygen.  Does not feel he is getting enough.  His levels are in the low 90s.  He states he just wants to sleep and he admits to feeling anxious.  Dr. Myna Hidalgo made aware and additional orders received and implemented for xanax.  Will continue to monitor patient.  Earleen Reaper RN

## 2019-02-24 NOTE — Progress Notes (Signed)
Contacted son discussed todays care. Questions answered, concerns addressed.   02/24/19 1800  Family/Significant Other Communication  Family/Significant Other Update Called

## 2019-02-24 NOTE — Progress Notes (Signed)
Bilateral lower extremity venous duplex has been completed. Preliminary results can be found in CV Proc through chart review.  Results were given to the patient's nurse, Shawnette.  02/24/19 8:59 AM Alexander Erickson RVT

## 2019-02-24 NOTE — Progress Notes (Signed)
[place] for Lovenox Indication: Rule out VTE  Allergies  Allergen Reactions  . Sulfur   . Thimerosal     Patient Measurements: Height: 5' 5.5" (166.4 cm) Weight: 187 lb 6.3 oz (85 kg) IBW/kg (Calculated) : 62.65  Vital Signs: Temp: 97.9 F (36.6 C) (12/06 0532) Temp Source: Axillary (12/06 0532) BP: 139/61 (12/06 0600) Pulse Rate: 90 (12/06 0600)  Labs: Recent Labs    2019/03/23 1234 03-23-19 1437 02/23/19 0307 02/24/19 0219  HGB 16.1  --  14.8 16.0  HCT 45.0  --  42.4 46.0  PLT 213  --  209 194  CREATININE 0.87  --  0.77 0.90  TROPONINIHS 18* 21*  --   --     Estimated Creatinine Clearance: 84 mL/min (by C-G formula based on SCr of 0.9 mg/dL).   Medical History: Past Medical History:  Diagnosis Date  . Hyperlipidemia   . Hypertension     Medications:  Scheduled:  . dexamethasone (DECADRON) injection  6 mg Intravenous Q24H  . enoxaparin (LOVENOX) injection  1 mg/kg Subcutaneous Q12H  . metoprolol tartrate  12.5 mg Oral BID  . rosuvastatin  20 mg Oral q1800  . sodium chloride flush  3 mL Intravenous Q12H   Infusions:  . cefTRIAXone (ROCEPHIN)  IV 2 g (02/23/19 2129)  . remdesivir 100 mg in NS 100 mL Stopped (02/23/19 1200)   PRN: acetaminophen, ALPRAZolam, HYDROcodone-acetaminophen, influenza vac split quadrivalent PF, labetalol, sodium chloride  Assessment: 63 yo male with COVID-19 pneumonia.  He has been receiving Lovenox 40mg  q24h for prophylaxis.  Pharmacy was consulted to change to full-dose given clinically suspected PE/DVT w/ markedly elevated d-dimer and other concerning clinical signs.Patient is undergoing LE doppler today.    Renal function is stable. H/H are within normal limits and platelets 209>>194.   No bleeding noted.   Goal of Therapy:  Anti-Xa level 0.6-1 units/ml 4hrs after LMWH dose given Monitor platelets by anticoagulation protocol: Yes   Plan:  Lovenox 85mg  (1mg /kg) SQ q12h Monitor  CBC, signs/symtoms of bleeding  Jimmy Footman, PharmD, BCPS, BCIDP Infectious Diseases Clinical Pharmacist Phone: [phone] 02/24/2019,8:46 AM

## 2019-02-24 NOTE — Progress Notes (Signed)
Late entry: Around 2330 patient very anxious about oxygen and desating. Xanax given and O2 15Lhfnc and 15LNRB applied and encouraged patient to relax and take good breaths. O2 sats stable in high 80s-90s. MD notified. Patient now asleep and resting quietly. No new orders.

## 2019-02-25 ENCOUNTER — Inpatient Hospital Stay (HOSPITAL_COMMUNITY): Payer: Medicaid Other

## 2019-02-25 LAB — COMPREHENSIVE METABOLIC PANEL
ALT: 37 U/L (ref 0–44)
AST: 46 U/L — ABNORMAL HIGH (ref 15–41)
Albumin: 2.3 g/dL — ABNORMAL LOW (ref 3.5–5.0)
Alkaline Phosphatase: 175 U/L — ABNORMAL HIGH (ref 38–126)
Anion gap: 16 — ABNORMAL HIGH (ref 5–15)
BUN: 30 mg/dL — ABNORMAL HIGH (ref 8–23)
CO2: 27 mmol/L (ref 22–32)
Calcium: 8.2 mg/dL — ABNORMAL LOW (ref 8.9–10.3)
Chloride: 87 mmol/L — ABNORMAL LOW (ref 98–111)
Creatinine, Ser: 0.93 mg/dL (ref 0.61–1.24)
GFR calc Af Amer: >60 mL/min (ref >60)
GFR calc non Af Amer: >60 mL/min (ref >60)
Glucose, Bld: 135 mg/dL — ABNORMAL HIGH (ref 70–99)
Potassium: 3.9 mmol/L (ref 3.5–5.1)
Sodium: 130 mmol/L — ABNORMAL LOW (ref 135–145)
Total Bilirubin: 0.6 mg/dL (ref 0.3–1.2)
Total Protein: 6.8 g/dL (ref 6.5–8.1)

## 2019-02-25 LAB — CBC WITH DIFFERENTIAL/PLATELET
Abs Immature Granulocytes: 0.08 10*3/uL — ABNORMAL HIGH (ref 0.00–0.07)
Basophils Absolute: 0 10*3/uL (ref 0.0–0.1)
Basophils Relative: 0 %
Eosinophils Absolute: 0 10*3/uL (ref 0.0–0.5)
Eosinophils Relative: 0 %
HCT: 46.6 % (ref 39.0–52.0)
Hemoglobin: 16.2 g/dL (ref 13.0–17.0)
Immature Granulocytes: 1 %
Lymphocytes Relative: 9 %
Lymphs Abs: 0.6 10*3/uL — ABNORMAL LOW (ref 0.7–4.0)
MCH: 29.7 pg (ref 26.0–34.0)
MCHC: 34.8 g/dL (ref 30.0–36.0)
MCV: 85.5 fL (ref 80.0–100.0)
Monocytes Absolute: 0.1 10*3/uL (ref 0.1–1.0)
Monocytes Relative: 2 %
Neutro Abs: 5.4 10*3/uL (ref 1.7–7.7)
Neutrophils Relative %: 88 %
Platelets: 149 10*3/uL — ABNORMAL LOW (ref 150–400)
RBC: 5.45 MIL/uL (ref 4.22–5.81)
RDW: 12.6 % (ref 11.5–15.5)
WBC: 6.1 10*3/uL (ref 4.0–10.5)
nRBC: 0 % (ref 0.0–0.2)

## 2019-02-25 LAB — MAGNESIUM: Magnesium: 2.5 mg/dL — ABNORMAL HIGH (ref 1.7–2.4)

## 2019-02-25 LAB — PHOSPHORUS: Phosphorus: 3.7 mg/dL (ref 2.5–4.6)

## 2019-02-25 LAB — CULTURE, RESPIRATORY W GRAM STAIN: Culture: NORMAL

## 2019-02-25 LAB — C-REACTIVE PROTEIN: CRP: 15.4 mg/dL — ABNORMAL HIGH (ref <1.0)

## 2019-02-25 LAB — FERRITIN: Ferritin: 810 ng/mL — ABNORMAL HIGH (ref 24–336)

## 2019-02-25 LAB — D-DIMER, QUANTITATIVE: D-Dimer, Quant: >20 ug/mL-FEU — ABNORMAL HIGH (ref 0.00–0.50)

## 2019-02-25 MED ORDER — VITAMIN D 25 MCG (1000 UNIT) PO TABS
1000.0000 [IU] | ORAL_TABLET | Freq: Every day | ORAL | Status: DC
  Administered 2019-02-25 – 2019-03-08 (×12): 1000 [IU] via ORAL
  Filled 2019-02-25 (×14): qty 1

## 2019-02-25 MED ORDER — ASCORBIC ACID 500 MG PO TABS
500.0000 mg | ORAL_TABLET | Freq: Every day | ORAL | Status: DC
  Administered 2019-02-25 – 2019-03-08 (×12): 500 mg via ORAL
  Filled 2019-02-25 (×11): qty 1

## 2019-02-25 MED ORDER — LIP MEDEX EX OINT
1.0000 "application " | TOPICAL_OINTMENT | CUTANEOUS | Status: DC | PRN
  Filled 2019-02-25: qty 7

## 2019-02-25 MED ORDER — ZINC SULFATE 220 (50 ZN) MG PO CAPS
220.0000 mg | ORAL_CAPSULE | Freq: Every day | ORAL | Status: DC
  Administered 2019-02-25 – 2019-03-08 (×12): 220 mg via ORAL
  Filled 2019-02-25 (×12): qty 1

## 2019-02-25 NOTE — Progress Notes (Signed)
Patient placed in prone position for his nap.

## 2019-02-25 NOTE — Progress Notes (Signed)
Talked with patients son today.

## 2019-02-25 NOTE — Progress Notes (Signed)
Patient when asleep pulls off oxygen and desats to 68%. Slow to recover. Sats low 80s. IS used. 1250 x 10 but during use desat to 72%. 15L HFNC and 15L NRB . O2 sat 83%- Pleth changed. After use of nasal spray O2 sats increased to 87-88% 1276ml urine out of condom cath.Alert and oriented x 4.Denies chest pain just shortness of breath during desat and while talking.Dr. Myna Hidalgo notified via page. Awaiting response.

## 2019-02-25 NOTE — Progress Notes (Signed)
Patient had a 5 beat run of vtach nonsustained. Patient is awake and asymptomatic.RT recently also came for assessment for sustained low oxygen levels in 80-85%

## 2019-02-25 NOTE — Progress Notes (Signed)
Patient noted to pull off O2 HFNC and NRB multiple times with desaturations as low as 68%. Patient asked to tape to his face. Foam dressing applied to secure oxygen to face bil so patient could sleep. Patient O2 returned to Sat of 91 % after reapplied.

## 2019-02-25 NOTE — Progress Notes (Addendum)
Alexander Erickson  OEU:[id number] DOB: 08-09-1955 DOA: Mar 12, 2019 PCP: Patient, No Pcp Per    Brief Narrative:  63 year old with a history of HTN and HLD who presented to the ED with a week of progressive cough and shortness of breath.  He lives in the [place] area with his brother who has also been symptomatic.  In the ED he was found to have saturations in the upper 60s on room air and was tachypneic.  EKG noted multiple PVCs.  His sodium was low at 122 and he was confirmed to be Covid positive.  A CXR noted diffuse dense interstitial infiltrates.  Significant Events: 11/30 COVID test + 12/4 admit to [facility] via [facility] ED  COVID-19 specific Treatment: Decadron 12/4 > Remdesivir 12/4 > Actemra 12/6  Subjective: Still requiring high level oxygen support but actually appears much more stable today with less respiratory distress.  Able to carry on a conversation much more easily.  Is alert and oriented.  Denies chest pain nausea vomiting or abdominal pain.  States he feels much better overall.  I have personally reviewed his follow-up chest x-ray from today.  Bilateral pulmonary infiltrates persist but there is perhaps somewhat improved aeration on the right.  Assessment & Plan:  COVID Pneumonia - acute hypoxic resp failure  Continue Decadron and remdesivir - dosed w/ Actemra - he remains at high risk for further decline - the next step will be transferring him to the ICU for heated high flow nasal cannula if he declines - we will continue to prone him as long as he can tolerate  Recent Labs  Lab 2019-03-12 1234 03/12/2019 1437 02/23/19 0307 02/24/19 0219 02/25/19 0258  DDIMER  --   --  >20.00* >20.00* >20.00*  FERRITIN 1,419*  --  951* 1,237* 810*  CRP 14.7*  --  17.0* 10.1* 15.4*  ALT 39  --  41 51* 37  PROCALCITON  --  0.28 0.28 0.37  --    Confirmed R LE DVT - suspected PE  cont full dose Lovenox - venous duplex lower extremities confirmed DVT of the right leg - do not feel  patient is clinically stable enough to obtain CT angio at this time  Possible bacterial pneumonia Procalcitonin elevation has been marginal -clinically the patient does not have features strongly suggestive of bacterial pneumonia -I am continuing antibiotic therapy at the present time because he is severely ill and cannot afford to miss an opportunity to treat a bacterial infection however my index of suspicion is quite low and given the severity of his Covid induced respiratory distress I do not feel this should prohibit the use of Actemra   Hyponatremia urine sodium less than 10 is not consistent with SIADH -improving with diuresis -continue same  Frequent cardiac ectopy -PVCs Initiated beta-blocker -likely precipitated by respiratory distress - worrisome for possibility of PE  Prolonged QTC  DVT prophylaxis: Full dose Lovenox Code Status: FULL CODE Family Communication:  Disposition Plan: Progressive care  Consultants:  none  Antimicrobials:  Azithromycin 12/4 Ceftriaxone 12/4 >  Objective: Blood pressure 132/77, pulse 82, temperature (!) 97.3 F (36.3 C), temperature source Axillary, resp. rate (!) 25, height 5' 5.5" (1.664 m), weight 85 kg, SpO2 (!) 88 %.  Intake/Output Summary (Last 24 hours) at 02/25/2019 1655 Last data filed at 02/25/2019 0900 Gross per 24 hour  Intake 120 ml  Output 2750 ml  Net -2630 ml   Filed Weights   2019/03/12 2000 02/24/19 0532  Weight: 85 kg 85  kg    Examination: General: Less distressed today Lungs: Coarse crackles diffusely without Cardiovascular: RRR with frequent ectopic beats Abdomen: NT/ND, soft, BS positive Extremities: No C/C/E bilateral lower extremities  CBC: Recent Labs  Lab 02/23/19 0307 02/24/19 0219 02/25/19 0258  WBC 7.5 9.0 6.1  NEUTROABS 6.5 8.0* 5.4  HGB 14.8 16.0 16.2  HCT 42.4 46.0 46.6  MCV 84.6 84.2 85.5  PLT 209 194 149*   Basic Metabolic Panel: Recent Labs  Lab 02/23/19 0307 02/24/19 0219 02/25/19  0258  NA 125* 129* 130*  K 3.9 4.0 3.9  CL 86* 85* 87*  CO2 23 25 27   GLUCOSE 122* 122* 135*  BUN 20 27* 30*  CREATININE 0.77 0.90 0.93  CALCIUM 8.1* 8.4* 8.2*  MG 2.1 2.2 2.5*  PHOS 3.0 3.3 3.7   GFR: Estimated Creatinine Clearance: 81.3 mL/min (by C-G formula based on SCr of 0.93 mg/dL).  Liver Function Tests: Recent Labs  Lab 11-23-2018 1234 02/23/19 0307 02/24/19 0219 02/25/19 0258  AST 74* 66* 55* 46*  ALT 39 41 51* 37  ALKPHOS 97 109 154* 175*  BILITOT 1.1 0.8 0.6 0.6  PROT 7.3 6.7 7.2 6.8  ALBUMIN 2.9* 2.4* 2.7* 2.3*    Recent Results (from the past 240 hour(s))  Novel Coronavirus, NAA ([facility])     Status: Abnormal   Collection Time: 02/18/19  1:26 PM   Specimen: Nasopharyngeal(NP) swabs in vial transport medium   NASOPHARYNGE  TESTING  Result Value Ref Range Status   SARS-CoV-2, NAA Detected (A) Not Detected Final    Comment: This nucleic acid amplification test was developed and its performance characteristics determined by [facility]. Nucleic acid amplification tests include PCR and TMA. This test has not been FDA cleared or approved. This test has been authorized by FDA under an Emergency Use Authorization (EUA). This test is only authorized for the duration of time the declaration that circumstances exist justifying the authorization of the emergency use of in vitro diagnostic tests for detection of SARS-CoV-2 virus and/or diagnosis of COVID-19 infection under section 564(b)(1) of the Act, 21 U.S.C. 161WRU-0(A360bbb-3(b) (1), unless the authorization is terminated or revoked sooner. When diagnostic testing is negative, the possibility of a false negative result should be considered in the context of a patient's recent exposures and the presence of clinical signs and symptoms consistent with COVID-19. An individual without symptoms of COVID-19 and who is not shedding SARS-CoV-2 virus would  expect to have a negative (not detected) result in this assay.    Culture, sputum-assessment     Status: None   Collection Time: 02/23/19  5:00 AM   Specimen: Sputum  Result Value Ref Range Status   Specimen Description SPUTUM  Final   Special Requests NONE  Final   Sputum evaluation   Final    THIS SPECIMEN IS ACCEPTABLE FOR SPUTUM CULTURE Performed at [facility], [address] [place], [place] [address]    Report Status 02/23/2019 FINAL  Final  Culture, respiratory     Status: None   Collection Time: 02/23/19  5:00 AM   Specimen: SPU  Result Value Ref Range Status   Specimen Description   Final    SPUTUM Performed at [facility], [address] [place], [place] [address]    Special Requests   Final    NONE Reflexed from [id number] Performed at [facility], [address] [place], [place] [address]    Gram Stain   Final  RARE WBC PRESENT, PREDOMINANTLY PMN FEW GRAM POSITIVE COCCI IN CLUSTERS FEW GRAM NEGATIVE RODS FEW GRAM POSITIVE RODS    Culture   Final    FEW Consistent with normal respiratory flora. Performed at [facility], [address], [place], [place] [address]    Report Status 02/25/2019 FINAL  Final     Scheduled Meds: . dexamethasone (DECADRON) injection  6 mg Intravenous Q24H  . enoxaparin (LOVENOX) injection  1 mg/kg Subcutaneous Q12H  . furosemide  60 mg Intravenous Q12H  . metoprolol tartrate  12.5 mg Oral BID  . rosuvastatin  20 mg Oral q1800  . sodium chloride flush  3 mL Intravenous Q12H   Continuous Infusions: . cefTRIAXone (ROCEPHIN)  IV Stopped (02/24/19 2230)  . remdesivir 100 mg in NS 100 mL Stopped (02/25/19 1100)     LOS: 3 days   Lonia Blood, MD [facility] Office  [phone] Pager - Text Page per Amion  If 7PM-7AM, please contact night-coverage per Amion 02/25/2019, 4:55 PM

## 2019-02-26 LAB — FERRITIN: Ferritin: 1144 ng/mL — ABNORMAL HIGH (ref 24–336)

## 2019-02-26 LAB — COMPREHENSIVE METABOLIC PANEL
ALT: 36 U/L (ref 0–44)
AST: 47 U/L — ABNORMAL HIGH (ref 15–41)
Albumin: 2.4 g/dL — ABNORMAL LOW (ref 3.5–5.0)
Alkaline Phosphatase: 215 U/L — ABNORMAL HIGH (ref 38–126)
Anion gap: 20 — ABNORMAL HIGH (ref 5–15)
BUN: 35 mg/dL — ABNORMAL HIGH (ref 8–23)
CO2: 26 mmol/L (ref 22–32)
Calcium: 7.9 mg/dL — ABNORMAL LOW (ref 8.9–10.3)
Chloride: 82 mmol/L — ABNORMAL LOW (ref 98–111)
Creatinine, Ser: 0.85 mg/dL (ref 0.61–1.24)
GFR calc Af Amer: >60 mL/min (ref >60)
GFR calc non Af Amer: >60 mL/min (ref >60)
Glucose, Bld: 113 mg/dL — ABNORMAL HIGH (ref 70–99)
Potassium: 4.1 mmol/L (ref 3.5–5.1)
Sodium: 128 mmol/L — ABNORMAL LOW (ref 135–145)
Total Bilirubin: 0.7 mg/dL (ref 0.3–1.2)
Total Protein: 6.6 g/dL (ref 6.5–8.1)

## 2019-02-26 LAB — CBC WITH DIFFERENTIAL/PLATELET
Abs Immature Granulocytes: 0.13 10*3/uL — ABNORMAL HIGH (ref 0.00–0.07)
Basophils Absolute: 0 10*3/uL (ref 0.0–0.1)
Basophils Relative: 1 %
Eosinophils Absolute: 0 10*3/uL (ref 0.0–0.5)
Eosinophils Relative: 0 %
HCT: 52.3 % — ABNORMAL HIGH (ref 39.0–52.0)
Hemoglobin: 17.8 g/dL — ABNORMAL HIGH (ref 13.0–17.0)
Immature Granulocytes: 2 %
Lymphocytes Relative: 8 %
Lymphs Abs: 0.7 10*3/uL (ref 0.7–4.0)
MCH: 29.6 pg (ref 26.0–34.0)
MCHC: 34 g/dL (ref 30.0–36.0)
MCV: 86.9 fL (ref 80.0–100.0)
Monocytes Absolute: 0.1 10*3/uL (ref 0.1–1.0)
Monocytes Relative: 2 %
Neutro Abs: 7.5 10*3/uL (ref 1.7–7.7)
Neutrophils Relative %: 87 %
Platelets: 178 10*3/uL (ref 150–400)
RBC: 6.02 MIL/uL — ABNORMAL HIGH (ref 4.22–5.81)
RDW: 12.7 % (ref 11.5–15.5)
WBC: 8.5 10*3/uL (ref 4.0–10.5)
nRBC: 0 % (ref 0.0–0.2)

## 2019-02-26 LAB — D-DIMER, QUANTITATIVE: D-Dimer, Quant: 13.96 ug/mL-FEU — ABNORMAL HIGH (ref 0.00–0.50)

## 2019-02-26 LAB — C-REACTIVE PROTEIN: CRP: 7.8 mg/dL — ABNORMAL HIGH (ref <1.0)

## 2019-02-26 LAB — MAGNESIUM: Magnesium: 2.5 mg/dL — ABNORMAL HIGH (ref 1.7–2.4)

## 2019-02-26 MED ORDER — ORAL CARE MOUTH RINSE
15.0000 mL | Freq: Two times a day (BID) | OROMUCOSAL | Status: DC
  Administered 2019-02-26 – 2019-03-09 (×23): 15 mL via OROMUCOSAL

## 2019-02-26 NOTE — Progress Notes (Signed)
Alexander Erickson  YSA:[id number] DOB: 1955-10-05 DOA: 15-Mar-2019 PCP: Patient, No Pcp Per    Brief Narrative:  63 year old with a history of HTN and HLD who presented to the ED with a week of progressive cough and shortness of breath.  He lives in the [place] area with his brother who has also been symptomatic.  In the ED he was found to have saturations in the upper 60s on room air and was tachypneic.  EKG noted multiple PVCs.  His sodium was low at 122 and he was confirmed to be Covid positive.  A CXR noted diffuse dense interstitial infiltrates.  Significant Events: 11/30 COVID test + 12/4 admit to [facility] via [facility] ED  COVID-19 specific Treatment: Decadron 12/4 > Remdesivir 12/4 > Actemra 12/6  Subjective: Resting comfortably in bed.  Able to complete full sentences without pausing.  Does not appear to be in distress despite still requiring high level oxygen support.  Denies chest pain nausea vomiting or abdominal pain.  Feels that he is slowly improving.  Assessment & Plan:  COVID Pneumonia - acute hypoxic resp failure  Continue Decadron and remdesivir - dosed w/ Actemra - he remains at high risk for further decline - continue to prone him as long as he can tolerate -appears to be improving somewhat  Recent Labs  Lab 03/15/19 1234 2019-03-15 1437 02/23/19 0307 02/24/19 0219 02/25/19 0258 02/26/19 0355  DDIMER  --   --  >20.00* >20.00* >20.00* 13.96*  FERRITIN 1,419*  --  951* 1,237* 810* 1,144*  CRP 14.7*  --  17.0* 10.1* 15.4* 7.8*  ALT 39  --  41 51* 37 36  PROCALCITON  --  0.28 0.28 0.37  --   --    Confirmed R LE DVT - suspected PE  cont full dose Lovenox - venous duplex lower extremities confirmed DVT of the right leg - do not feel patient is clinically stable enough to obtain CT angio at this time  Possible bacterial pneumonia Procalcitonin elevation has been marginal -clinically the patient does not have features strongly suggestive of bacterial pneumonia -I am  continuing antibiotic therapy at the present time because he is severely ill and cannot afford to miss an opportunity to treat a bacterial infection however my index of suspicion is quite low and given the severity of his Covid induced respiratory distress I do not feel this should prohibit the use of Actemra   Hyponatremia urine sodium less than 10 not consistent with SIADH -improving with diuresis -continue same  Frequent cardiac ectopy -PVCs Initiated beta-blocker -likely precipitated by respiratory distress - worrisome for possibility of PE  Prolonged QTC  DVT prophylaxis: Full dose Lovenox Code Status: FULL CODE Family Communication:  Disposition Plan: Progressive care  Consultants:  none  Antimicrobials:  Azithromycin 12/4 Ceftriaxone 12/4 >  Objective: Blood pressure 120/66, pulse 81, temperature 97.8 F (36.6 C), temperature source Axillary, resp. rate (!) 25, height 5' 5.5" (1.664 m), weight 85 kg, SpO2 90 %.  Intake/Output Summary (Last 24 hours) at 02/26/2019 0937 Last data filed at 02/25/2019 2015 Gross per 24 hour  Intake -  Output 325 ml  Net -325 ml   Filed Weights   2019/03/15 2000 02/24/19 0532  Weight: 85 kg 85 kg    Examination: General: Awake and alert -no acute distress Lungs: Coarse crackles diffusely -no wheezing Cardiovascular: RRR -no murmur Abdomen: NT/ND, soft, BS positive Extremities: No C/C/E B LE  CBC: Recent Labs  Lab 02/24/19 0219 02/25/19 0258 02/26/19  0355  WBC 9.0 6.1 8.5  NEUTROABS 8.0* 5.4 7.5  HGB 16.0 16.2 17.8*  HCT 46.0 46.6 52.3*  MCV 84.2 85.5 86.9  PLT 194 149* 178   Basic Metabolic Panel: Recent Labs  Lab 02/23/19 0307 02/24/19 0219 02/25/19 0258 02/26/19 0355  NA 125* 129* 130* 128*  K 3.9 4.0 3.9 4.1  CL 86* 85* 87* 82*  CO2 23 25 27 26   GLUCOSE 122* 122* 135* 113*  BUN 20 27* 30* 35*  CREATININE 0.77 0.90 0.93 0.85  CALCIUM 8.1* 8.4* 8.2* 7.9*  MG 2.1 2.2 2.5* 2.5*  PHOS 3.0 3.3 3.7  --    GFR:  Estimated Creatinine Clearance: 88.9 mL/min (by C-G formula based on SCr of 0.85 mg/dL).  Liver Function Tests: Recent Labs  Lab 02/23/19 0307 02/24/19 0219 02/25/19 0258 02/26/19 0355  AST 66* 55* 46* 47*  ALT 41 51* 37 36  ALKPHOS 109 154* 175* 215*  BILITOT 0.8 0.6 0.6 0.7  PROT 6.7 7.2 6.8 6.6  ALBUMIN 2.4* 2.7* 2.3* 2.4*    Recent Results (from the past 240 hour(s))  Novel Coronavirus, NAA ([facility])     Status: Abnormal   Collection Time: 02/18/19  1:26 PM   Specimen: Nasopharyngeal(NP) swabs in vial transport medium   NASOPHARYNGE  TESTING  Result Value Ref Range Status   SARS-CoV-2, NAA Detected (A) Not Detected Final    Comment: This nucleic acid amplification test was developed and its performance characteristics determined by [facility]. Nucleic acid amplification tests include PCR and TMA. This test has not been FDA cleared or approved. This test has been authorized by FDA under an Emergency Use Authorization (EUA). This test is only authorized for the duration of time the declaration that circumstances exist justifying the authorization of the emergency use of in vitro diagnostic tests for detection of SARS-CoV-2 virus and/or diagnosis of COVID-19 infection under section 564(b)(1) of the Act, 21 U.S.C. 161WRU-0(A360bbb-3(b) (1), unless the authorization is terminated or revoked sooner. When diagnostic testing is negative, the possibility of a false negative result should be considered in the context of a patient's recent exposures and the presence of clinical signs and symptoms consistent with COVID-19. An individual without symptoms of COVID-19 and who is not shedding SARS-CoV-2 virus would  expect to have a negative (not detected) result in this assay.   Culture, sputum-assessment     Status: None   Collection Time: 02/23/19  5:00 AM   Specimen: Sputum  Result Value Ref Range Status   Specimen Description SPUTUM  Final   Special Requests NONE  Final    Sputum evaluation   Final    THIS SPECIMEN IS ACCEPTABLE FOR SPUTUM CULTURE Performed at [facility], [address] [place], [place] [address]    Report Status 02/23/2019 FINAL  Final  Culture, respiratory     Status: None   Collection Time: 02/23/19  5:00 AM   Specimen: SPU  Result Value Ref Range Status   Specimen Description   Final    SPUTUM Performed at [facility], [address] [place], [place] [address]    Special Requests   Final    NONE Reflexed from [id number] Performed at [facility], [address] [place], [place] [address]    Gram Stain   Final    RARE WBC PRESENT, PREDOMINANTLY PMN FEW GRAM POSITIVE COCCI IN CLUSTERS FEW GRAM NEGATIVE RODS FEW GRAM POSITIVE RODS    Culture   Final  FEW Consistent with normal respiratory flora. Performed at [facility], [address], [place], [place] [address]    Report Status 02/25/2019 FINAL  Final     Scheduled Meds: . cholecalciferol  1,000 Units Oral Daily  . dexamethasone (DECADRON) injection  6 mg Intravenous Q24H  . enoxaparin (LOVENOX) injection  1 mg/kg Subcutaneous Q12H  . furosemide  60 mg Intravenous Q12H  . mouth rinse  15 mL Mouth Rinse BID  . metoprolol tartrate  12.5 mg Oral BID  . rosuvastatin  20 mg Oral q1800  . sodium chloride flush  3 mL Intravenous Q12H  . vitamin C  500 mg Oral Daily  . zinc sulfate  220 mg Oral Daily   Continuous Infusions: . cefTRIAXone (ROCEPHIN)  IV Stopped (02/25/19 2300)     LOS: 4 days   Lonia Blood, MD [facility] Office  [phone] Pager - Text Page per Amion  If 7PM-7AM, please contact night-coverage per Amion 02/26/2019, 9:37 AM

## 2019-02-27 ENCOUNTER — Inpatient Hospital Stay (HOSPITAL_COMMUNITY): Payer: Medicaid Other

## 2019-02-27 ENCOUNTER — Inpatient Hospital Stay (HOSPITAL_COMMUNITY): Payer: Self-pay

## 2019-02-27 DIAGNOSIS — J9601 Acute respiratory failure with hypoxia: Secondary | ICD-10-CM

## 2019-02-27 LAB — ECHOCARDIOGRAM COMPLETE
Height: 65.5 in
Weight: 2998.26 oz

## 2019-02-27 LAB — CBC WITH DIFFERENTIAL/PLATELET
Abs Immature Granulocytes: 0.17 10*3/uL — ABNORMAL HIGH (ref 0.00–0.07)
Basophils Absolute: 0 10*3/uL (ref 0.0–0.1)
Basophils Relative: 0 %
Eosinophils Absolute: 0 10*3/uL (ref 0.0–0.5)
Eosinophils Relative: 0 %
HCT: 53.9 % — ABNORMAL HIGH (ref 39.0–52.0)
Hemoglobin: 18.3 g/dL — ABNORMAL HIGH (ref 13.0–17.0)
Immature Granulocytes: 2 %
Lymphocytes Relative: 7 %
Lymphs Abs: 0.6 10*3/uL — ABNORMAL LOW (ref 0.7–4.0)
MCH: 29.4 pg (ref 26.0–34.0)
MCHC: 34 g/dL (ref 30.0–36.0)
MCV: 86.5 fL (ref 80.0–100.0)
Monocytes Absolute: 0.2 10*3/uL (ref 0.1–1.0)
Monocytes Relative: 2 %
Neutro Abs: 8.1 10*3/uL — ABNORMAL HIGH (ref 1.7–7.7)
Neutrophils Relative %: 89 %
Platelets: 194 10*3/uL (ref 150–400)
RBC: 6.23 MIL/uL — ABNORMAL HIGH (ref 4.22–5.81)
RDW: 12.6 % (ref 11.5–15.5)
WBC: 9.1 10*3/uL (ref 4.0–10.5)
nRBC: 0 % (ref 0.0–0.2)

## 2019-02-27 LAB — COMPREHENSIVE METABOLIC PANEL
ALT: 34 U/L (ref 0–44)
AST: 40 U/L (ref 15–41)
Albumin: 2.8 g/dL — ABNORMAL LOW (ref 3.5–5.0)
Alkaline Phosphatase: 239 U/L — ABNORMAL HIGH (ref 38–126)
Anion gap: 18 — ABNORMAL HIGH (ref 5–15)
BUN: 41 mg/dL — ABNORMAL HIGH (ref 8–23)
CO2: 32 mmol/L (ref 22–32)
Calcium: 8.6 mg/dL — ABNORMAL LOW (ref 8.9–10.3)
Chloride: 83 mmol/L — ABNORMAL LOW (ref 98–111)
Creatinine, Ser: 0.87 mg/dL (ref 0.61–1.24)
GFR calc Af Amer: >60 mL/min (ref >60)
GFR calc non Af Amer: >60 mL/min (ref >60)
Glucose, Bld: 114 mg/dL — ABNORMAL HIGH (ref 70–99)
Potassium: 3.8 mmol/L (ref 3.5–5.1)
Sodium: 133 mmol/L — ABNORMAL LOW (ref 135–145)
Total Bilirubin: 1 mg/dL (ref 0.3–1.2)
Total Protein: 7.6 g/dL (ref 6.5–8.1)

## 2019-02-27 LAB — D-DIMER, QUANTITATIVE: D-Dimer, Quant: 10.45 ug/mL-FEU — ABNORMAL HIGH (ref 0.00–0.50)

## 2019-02-27 LAB — HEMOGLOBIN A1C
Hgb A1c MFr Bld: 6.8 % — ABNORMAL HIGH (ref 4.8–5.6)
Mean Plasma Glucose: 148.46 mg/dL

## 2019-02-27 LAB — MAGNESIUM: Magnesium: 2.5 mg/dL — ABNORMAL HIGH (ref 1.7–2.4)

## 2019-02-27 LAB — GLUCOSE, CAPILLARY: Glucose-Capillary: 187 mg/dL — ABNORMAL HIGH (ref 70–99)

## 2019-02-27 LAB — FERRITIN: Ferritin: 1178 ng/mL — ABNORMAL HIGH (ref 24–336)

## 2019-02-27 LAB — C-REACTIVE PROTEIN: CRP: 2.7 mg/dL — ABNORMAL HIGH (ref <1.0)

## 2019-02-27 MED ORDER — POTASSIUM CHLORIDE CRYS ER 20 MEQ PO TBCR
40.0000 meq | EXTENDED_RELEASE_TABLET | Freq: Once | ORAL | Status: AC
  Administered 2019-02-27: 40 meq via ORAL
  Filled 2019-02-27: qty 2

## 2019-02-27 MED ORDER — PANTOPRAZOLE SODIUM 40 MG PO TBEC
40.0000 mg | DELAYED_RELEASE_TABLET | Freq: Every day | ORAL | Status: DC
  Administered 2019-02-27 – 2019-03-09 (×11): 40 mg via ORAL
  Filled 2019-02-27 (×11): qty 1

## 2019-02-27 MED ORDER — SODIUM CHLORIDE 0.9 % IV SOLN
100.0000 mg | Freq: Every day | INTRAVENOUS | Status: AC
  Administered 2019-02-27 – 2019-03-03 (×5): 100 mg via INTRAVENOUS
  Filled 2019-02-27 (×6): qty 20

## 2019-02-27 MED ORDER — IOHEXOL 350 MG/ML SOLN
100.0000 mL | Freq: Once | INTRAVENOUS | Status: AC | PRN
  Administered 2019-02-27: 100 mL via INTRAVENOUS

## 2019-02-27 MED ORDER — INSULIN ASPART 100 UNIT/ML ~~LOC~~ SOLN
0.0000 [IU] | Freq: Three times a day (TID) | SUBCUTANEOUS | Status: DC
  Administered 2019-02-27 – 2019-02-28 (×4): 2 [IU] via SUBCUTANEOUS
  Administered 2019-03-01: 1 [IU] via SUBCUTANEOUS
  Administered 2019-03-01 (×2): 2 [IU] via SUBCUTANEOUS
  Administered 2019-03-02: 7 [IU] via SUBCUTANEOUS
  Administered 2019-03-02: 3 [IU] via SUBCUTANEOUS
  Administered 2019-03-03: 2 [IU] via SUBCUTANEOUS
  Administered 2019-03-03: 3 [IU] via SUBCUTANEOUS
  Administered 2019-03-04: 7 [IU] via SUBCUTANEOUS
  Administered 2019-03-04: 2 [IU] via SUBCUTANEOUS
  Administered 2019-03-05: 5 [IU] via SUBCUTANEOUS
  Administered 2019-03-05: 09:00:00 2 [IU] via SUBCUTANEOUS
  Administered 2019-03-05 – 2019-03-06 (×2): 5 [IU] via SUBCUTANEOUS
  Administered 2019-03-06: 1 [IU] via SUBCUTANEOUS
  Administered 2019-03-06 – 2019-03-07 (×2): 2 [IU] via SUBCUTANEOUS
  Administered 2019-03-07: 18:00:00 3 [IU] via SUBCUTANEOUS
  Administered 2019-03-07 – 2019-03-08 (×2): 1 [IU] via SUBCUTANEOUS
  Administered 2019-03-08: 2 [IU] via SUBCUTANEOUS
  Administered 2019-03-08: 13:00:00 3 [IU] via SUBCUTANEOUS
  Administered 2019-03-09: 2 [IU] via SUBCUTANEOUS
  Administered 2019-03-09: 1 [IU] via SUBCUTANEOUS
  Administered 2019-03-09 – 2019-03-10 (×3): 2 [IU] via SUBCUTANEOUS

## 2019-02-27 MED ORDER — HEPARIN (PORCINE) 25000 UT/250ML-% IV SOLN
1200.0000 [IU]/h | INTRAVENOUS | Status: DC
  Administered 2019-02-27: 1200 [IU]/h via INTRAVENOUS
  Filled 2019-02-27: qty 250

## 2019-02-27 MED ORDER — CHLORHEXIDINE GLUCONATE CLOTH 2 % EX PADS
6.0000 | MEDICATED_PAD | Freq: Every day | CUTANEOUS | Status: DC
  Administered 2019-02-27 – 2019-03-13 (×16): 6 via TOPICAL

## 2019-02-27 NOTE — Progress Notes (Signed)
PROGRESS NOTE                                                                                                                                                                                                             Patient Demographics:    Alexander Erickson, is a 63 y.o. male, DOB - [birth year], HAL:[id number]  Outpatient Primary MD for the patient is Patient, No Pcp Per   Admit date - 03/04/2019   LOS - 5  No chief complaint on file.      Brief Narrative: Patient is a 63 y.o. male with PMHx of HTN, HLD-who presented with 1 week history of progressive cough and shortness of breath-he was found to have acute hypoxemic respiratory failure in the setting of COVID-19 pneumonia along with severe hyponatremia.   Subjective:    Alexander Erickson today remains comfortable-however he is on both 100% NRB and high flow oxygen.  He feels somewhat uncomfortable with the NRB mask.   Assessment  & Plan :   Acute Hypoxic Resp Failure due to Covid 19 Viral pneumonia and possible concurrent bacterial pneumonia: Remains very tenuous-and at risk for deterioration requiring intubation-currently stable-but with significantly high oxygen requirement requiring both 100% NRB and 15 L of O2 via HFNC.  Suspect he would benefit better from heated high flow-as he is anxious-and the NRB mask is uncomfortable.  Spoke with nursing staff-we will transfer to the ICU-where he will be monitored closely while on heated high flow, if he is stable for 24 hours-he can be moved back to the PCU tomorrow.  Given overall tenuous situation-we will extend remdesivir for 5 more days for a total of 10 days, continue with Decadron.  Fever: afebrile  O2 requirements:  SpO2: 92 % O2 Flow Rate (L/min): 15 L/min + 100% O2 via NRB FiO2 (%): 100 %   COVID-19 Labs: Recent Labs    02/25/19 0258 02/26/19 0355 02/27/19 0315  DDIMER >20.00* 13.96* 10.45*  FERRITIN 810* 1,144*  1,178*  CRP 15.4* 7.8* 2.7*       Component Value Date/Time   BNP 53.0 03/05/2019 1234    Recent Labs  Lab 03/09/2019 1437 02/23/19 0307 02/24/19 0219  PROCALCITON 0.28 0.28 0.37    Lab Results  Component Value Date   SARSCOV2NAA Detected (A) 02/18/2019     COVID-19 Medications: Decadron 12/4 >  Remdesivir 12/4 >  Actemra 12/6  Other medications: Diuretics:Euvolemic-continue Lasix to maintain negative balance Antibiotics: Completed Rocephin x5 days on 12/8-given elevated prolactin level Monitor CBGs on SSI while on steroids-does not have history of DM-we will check A1c  Prone/Incentive Spirometry: encouraged patient to lie prone for 3-4 hours at a time for a total of 16 hours a day, and to encourage incentive spirometry use 3-4/hour  DVT Prophylaxis  :  Lovenox -therapeutic dosing  Right lower extremity DVT: Remains on full dose Lovenox-given tenuous respiratory status not felt to be stable to tolerate a CT angio-check echocardiogram to assess if patient has RV strain-although clinically he does not have JVD or lower extremity edema.  Hyponatremia: Improving-continue cautious diuresis.  Dyslipidemia: Continue statin  Prolonged QTC: Seen on admitting EKG-repeat twelve-lead EKG tomorrow morning.  Obesity: Estimated body mass index is 30.71 kg/m as calculated from the following:   Height as of this encounter: 5' 5.5" (1.664 m).   Weight as of this encounter: 85 kg.    GI prophylaxis: PPI  Consults  :  None  Procedures  :  None  ABG: No results found for: PHART, PCO2ART, PO2ART, HCO3, TCO2, ACIDBASEDEF, O2SAT  Vent Settings: N/a  Condition - Extremely Guarded-very tenuous with risk for further deterioration  Family Communication  :  Son updated over the phone  Code Status :  Full Code  Diet :  Diet Order            Diet regular Room service appropriate? Yes; Fluid consistency: Thin  Diet effective now               Disposition Plan  :  Remain  hospitalized-transfer to ICU for close monitoring-and for heated high flow.  Barriers to discharge: Hypoxia requiring O2 supplementation  Antimicorbials  :    Anti-infectives (From admission, onward)   Start     Dose/Rate Route Frequency Ordered Stop   02/23/19 1000  remdesivir 100 mg in sodium chloride 0.9 % 100 mL IVPB  Status:  Discontinued     100 mg 200 mL/hr over 30 Minutes Intravenous Daily 03/19/2019 2057 03/13/2019 2102   02/23/19 1000  remdesivir 100 mg in sodium chloride 0.9 % 100 mL IVPB     100 mg 200 mL/hr over 30 Minutes Intravenous Daily 02/21/2019 2104 02/26/19 0915   02/20/2019 2315  doxycycline (VIBRA-TABS) tablet 100 mg  Status:  Discontinued     100 mg Oral Every 12 hours 03/10/2019 2306 02/23/19 1657   02/19/2019 2100  remdesivir 200 mg in sodium chloride 0.9% 250 mL IVPB  Status:  Discontinued     200 mg 580 mL/hr over 30 Minutes Intravenous Once 02/25/2019 2057 03/09/2019 2102   02/21/2019 2100  cefTRIAXone (ROCEPHIN) 2 g in sodium chloride 0.9 % 100 mL IVPB     2 g 200 mL/hr over 30 Minutes Intravenous Every 24 hours 03/14/2019 2058 02/26/19 2215   03/16/2019 2100  azithromycin (ZITHROMAX) tablet 500 mg  Status:  Discontinued     500 mg Oral Daily 03/14/2019 2058 03/15/2019 2306      Inpatient Medications  Scheduled Meds:  cholecalciferol  1,000 Units Oral Daily   dexamethasone (DECADRON) injection  6 mg Intravenous Q24H   enoxaparin (LOVENOX) injection  1 mg/kg Subcutaneous Q12H   furosemide  60 mg Intravenous Q12H   mouth rinse  15 mL Mouth Rinse BID   metoprolol tartrate  12.5 mg Oral BID   rosuvastatin  20 mg Oral q1800   sodium  chloride flush  3 mL Intravenous Q12H   vitamin C  500 mg Oral Daily   zinc sulfate  220 mg Oral Daily   Continuous Infusions: PRN Meds:.acetaminophen, ALPRAZolam, HYDROcodone-acetaminophen, influenza vac split quadrivalent PF, labetalol, lip balm, phenol, sodium chloride   Time Spent in minutes 45  The patient is critically ill with  multiple organ system failure and requires high complexity decision making for assessment and support, frequent evaluation and titration of therapies, advanced monitoring, review of radiographic studies and interpretation of complex data.   See all Orders from today for further details   Oren Binet M.D on 02/27/2019 at 12:44 PM  To page go to [email or web address] - use universal password  [facility] -  Office  [phone]    Objective:   Vitals:   02/27/19 0500 02/27/19 0736 02/27/19 0807 02/27/19 1121  BP: 124/64 111/71  109/71  Pulse:  94 95 (!) 102  Resp: (!) 28 (!) 23 (!) 29 (!) 24  Temp: (!) 97.4 F (36.3 C) 98.5 F (36.9 C)  97.6 F (36.4 C)  TempSrc: Axillary Axillary  Axillary  SpO2:  90% (!) 88% 92%  Weight:      Height:        Wt Readings from Last 3 Encounters:  02/24/19 85 kg  03-19-2019 86.2 kg     Intake/Output Summary (Last 24 hours) at 02/27/2019 1244 Last data filed at 02/27/2019 0300 Gross per 24 hour  Intake 500 ml  Output 2000 ml  Net -1500 ml     Physical Exam Gen Exam:Alert awake-not in any distress HEENT:atraumatic, normocephalic Chest: B/L clear to auscultation anteriorly CVS:S1S2 regular Abdomen:soft non tender, non distended Extremities:no edema Neurology: Non focal Skin: no rash   Data Review:    CBC Recent Labs  Lab 02/23/19 0307 02/24/19 0219 02/25/19 0258 02/26/19 0355 02/27/19 0315  WBC 7.5 9.0 6.1 8.5 9.1  HGB 14.8 16.0 16.2 17.8* 18.3*  HCT 42.4 46.0 46.6 52.3* 53.9*  PLT 209 194 149* 178 194  MCV 84.6 84.2 85.5 86.9 86.5  MCH 29.5 29.3 29.7 29.6 29.4  MCHC 34.9 34.8 34.8 34.0 34.0  RDW 12.5 12.8 12.6 12.7 12.6  LYMPHSABS 0.7 0.7 0.6* 0.7 0.6*  MONOABS 0.2 0.2 0.1 0.1 0.2  EOSABS 0.0 0.0 0.0 0.0 0.0  BASOSABS 0.0 0.0 0.0 0.0 0.0    Chemistries  Recent Labs  Lab 02/23/19 0307 02/24/19 0219 02/25/19 0258 02/26/19 0355 02/27/19 0315  NA 125* 129* 130* 128* 133*  K 3.9 4.0 3.9 4.1 3.8  CL 86* 85*  87* 82* 83*  CO2 23 25 27 26  32  GLUCOSE 122* 122* 135* 113* 114*  BUN 20 27* 30* 35* 41*  CREATININE 0.77 0.90 0.93 0.85 0.87  CALCIUM 8.1* 8.4* 8.2* 7.9* 8.6*  MG 2.1 2.2 2.5* 2.5* 2.5*  AST 66* 55* 46* 47* 40  ALT 41 51* 37 36 34  ALKPHOS 109 154* 175* 215* 239*  BILITOT 0.8 0.6 0.6 0.7 1.0   ------------------------------------------------------------------------------------------------------------------ No results for input(s): CHOL, HDL, LDLCALC, TRIG, CHOLHDL, LDLDIRECT in the last 72 hours.  No results found for: HGBA1C ------------------------------------------------------------------------------------------------------------------ No results for input(s): TSH, T4TOTAL, T3FREE, THYROIDAB in the last 72 hours.  Invalid input(s): FREET3 ------------------------------------------------------------------------------------------------------------------ Recent Labs    02/26/19 0355 02/27/19 0315  FERRITIN 1,144* 1,178*    Coagulation profile No results for input(s): INR, PROTIME in the last 168 hours.  Recent Labs    02/26/19 0355 02/27/19 0315  DDIMER 13.96* 10.45*  Cardiac Enzymes No results for input(s): CKMB, TROPONINI, MYOGLOBIN in the last 168 hours.  Invalid input(s): CK ------------------------------------------------------------------------------------------------------------------    Component Value Date/Time   BNP 53.0 02/19/2019 1234    Micro Results Recent Results (from the past 240 hour(s))  Novel Coronavirus, NAA ([facility])     Status: Abnormal   Collection Time: 02/18/19  1:26 PM   Specimen: Nasopharyngeal(NP) swabs in vial transport medium   NASOPHARYNGE  TESTING  Result Value Ref Range Status   SARS-CoV-2, NAA Detected (A) Not Detected Final    Comment: This nucleic acid amplification test was developed and its performance characteristics determined by [employer]. Nucleic acid amplification tests include PCR and TMA. This test  has not been FDA cleared or approved. This test has been authorized by FDA under an Emergency Use Authorization (EUA). This test is only authorized for the duration of time the declaration that circumstances exist justifying the authorization of the emergency use of in vitro diagnostic tests for detection of SARS-CoV-2 virus and/or diagnosis of COVID-19 infection under section 564(b)(1) of the Act, 21 U.S.C. 981XBJ-4(N) (1), unless the authorization is terminated or revoked sooner. When diagnostic testing is negative, the possibility of a false negative result should be considered in the context of a patient's recent exposures and the presence of clinical signs and symptoms consistent with COVID-19. An individual without symptoms of COVID-19 and who is not shedding SARS-CoV-2 virus would  expect to have a negative (not detected) result in this assay.   Culture, sputum-assessment     Status: None   Collection Time: 02/23/19  5:00 AM   Specimen: Sputum  Result Value Ref Range Status   Specimen Description SPUTUM  Final   Special Requests NONE  Final   Sputum evaluation   Final    THIS SPECIMEN IS ACCEPTABLE FOR SPUTUM CULTURE Performed at [facility], [address] [place], [place] [address]    Report Status 02/23/2019 FINAL  Final  Culture, respiratory     Status: None   Collection Time: 02/23/19  5:00 AM   Specimen: SPU  Result Value Ref Range Status   Specimen Description   Final    SPUTUM Performed at [facility], [address], [place], [place] [address]    Special Requests   Final    NONE Reflexed from [id number] Performed at [facility], [address], [place], [place] [address]    Gram Stain   Final    RARE WBC PRESENT, PREDOMINANTLY PMN FEW GRAM POSITIVE COCCI IN CLUSTERS FEW GRAM NEGATIVE RODS FEW GRAM POSITIVE RODS    Culture   Final    FEW Consistent with normal respiratory flora. Performed at [facility], [address], [place], [place] [address]    Report Status 02/25/2019 FINAL  Final    Radiology Reports Dg Chest Port 1 View  Result Date: 02/25/2019 CLINICAL DATA:  63 year old male COVID-19 pneumonia. EXAM: PORTABLE CHEST 1 VIEW COMPARISON:  03/18/2019 and earlier. FINDINGS: Portable AP semi upright view at 0648 hours. Lower lung volumes. Stable cardiac size and mediastinal contours. Visualized tracheal air column is within normal limits. Coarse and interstitial bilateral pulmonary opacity. Some improved ventilation at the right lung base. Most confluent opacity now remains at the left lung base. No superimposed pneumothorax or effusion. Paucity of bowel gas in the upper abdomen. No acute osseous abnormality identified. IMPRESSION: 1. Continued bilateral pneumonia with improved ventilation at the right lung base since 03/11/2019. 2.  No new cardiopulmonary abnormality. Electronically Signed   By: Odessa FlemingH  Hall M.D.   On: 02/25/2019 07:57   Dg Chest Portable 1 View  Result Date: 03/20/2019 CLINICAL DATA:  Shortness of breath, COVID positive EXAM: PORTABLE CHEST 1 VIEW COMPARISON:  None. FINDINGS: Cardiomediastinal contours are normal. There is diffuse interstitial and airspace opacity throughout both the right and left chest. No signs of dense consolidation or evidence of pleural effusion. No acute bone finding. IMPRESSION: Diffuse interstitial and airspace opacity throughout both lungs. Findings which could be seen in viral or atypical pneumonia, also with COVID-19 infection. Asymmetric pulmonary edema could have similar appearance. Electronically Signed   By: Donzetta KohutGeoffrey  Wile M.D.   On: 03/16/2019 13:01   Vas [place]as Lower Extremity Venous (dvt)  Result Date: 02/24/2019  Lower Venous Study Indications: Elevated Ddimer.  Risk Factors: COVID 19 positive. Comparison Study: No prior studies. Performing Technologist: Chanda BusingGregory Collins RVT  Examination Guidelines: A complete evaluation includes B-mode  imaging, spectral Doppler, color Doppler, and power Doppler as needed of all accessible portions of each vessel. Bilateral testing is considered an integral part of a complete examination. Limited examinations for reoccurring indications may be performed as noted.  +---------+---------------+---------+-----------+----------+--------------+  RIGHT     Compressibility Phasicity Spontaneity Properties Thrombus Aging  +---------+---------------+---------+-----------+----------+--------------+  CFV       Full            Yes       Yes                                    +---------+---------------+---------+-----------+----------+--------------+  SFJ       Full                                                             +---------+---------------+---------+-----------+----------+--------------+  FV Prox   Full                                                             +---------+---------------+---------+-----------+----------+--------------+  FV Mid    Full                                                             +---------+---------------+---------+-----------+----------+--------------+  FV Distal Full                                                             +---------+---------------+---------+-----------+----------+--------------+  PFV       Full                                                             +---------+---------------+---------+-----------+----------+--------------+  POP       Full            Yes       Yes                                    +---------+---------------+---------+-----------+----------+--------------+  PTV       Partial                                          Acute           +---------+---------------+---------+-----------+----------+--------------+  PERO      Full                                                             +---------+---------------+---------+-----------+----------+--------------+   +---------+---------------+---------+-----------+----------+--------------+  LEFT       Compressibility Phasicity Spontaneity Properties Thrombus Aging  +---------+---------------+---------+-----------+----------+--------------+  CFV       Full            Yes       Yes                                    +---------+---------------+---------+-----------+----------+--------------+  SFJ       Full                                                             +---------+---------------+---------+-----------+----------+--------------+  FV Prox   Full                                                             +---------+---------------+---------+-----------+----------+--------------+  FV Mid    Full                                                             +---------+---------------+---------+-----------+----------+--------------+  FV Distal Full                                                             +---------+---------------+---------+-----------+----------+--------------+  PFV       Full                                                             +---------+---------------+---------+-----------+----------+--------------+  POP       Full            Yes       Yes                                    +---------+---------------+---------+-----------+----------+--------------+  PTV       Full                                                             +---------+---------------+---------+-----------+----------+--------------+  PERO      Full                                                             +---------+---------------+---------+-----------+----------+--------------+     Summary: Right: Findings consistent with acute deep vein thrombosis involving the right posterior tibial veins. No cystic structure found in the popliteal fossa. Left: There is no evidence of deep vein thrombosis in the lower extremity. No cystic structure found in the popliteal fossa.  *See table(s) above for measurements and observations. Electronically signed by Waverly Ferrari MD on 02/24/2019 at 3:55:02 PM.    Final

## 2019-02-27 NOTE — Progress Notes (Addendum)
Informed by cardiology that patient appears to have a thrombus in the proximal IVC.  Patient does have a DVT in the right lower extremity.  Patient is hypoxic from Covid pneumonitis.  Given severe hypoxemia-concern that IVC clot may dislodge-which potentially could be fatal.  Spoke with vascular surgery-Dr. Norville Haggard recommended switching anticoagulation to heparin infusion, keeping patient n.p.o. post midnight-and getting a dedicated CT venogram of the chest, abdomen and pelvis to see how extensive patient's IVC clot is.  Depending on CT venogram results-vascular surgery will provide further recommendations.  I have updated patient's son regarding above developments.

## 2019-02-27 NOTE — Progress Notes (Signed)
  Echocardiogram 2D Echocardiogram has been performed.  Alexander Erickson 02/27/2019, 4:38 PM

## 2019-02-27 NOTE — Progress Notes (Signed)
[place] for Lovenox Indication: VTE, suspected PE  Allergies  Allergen Reactions  . Neomycin Other (See Comments) and Swelling    TOPICAL FORM ONLY.  . Other Other (See Comments)    Soy bean oil causes bronchial congestion, phlegm.  Animal dander.  Seasonal and environmental.  . Sulfur   . Thimerosal     Patient Measurements: Height: 5' 5.5" (166.4 cm) Weight: 187 lb 6.3 oz (85 kg) IBW/kg (Calculated) : 62.65  Vital Signs: Temp: 98.5 F (36.9 C) (12/09 0736) Temp Source: Axillary (12/09 0736) BP: 111/71 (12/09 0736) Pulse Rate: 95 (12/09 0807)  Labs: Recent Labs    02/25/19 0258 02/26/19 0355 02/27/19 0315  HGB 16.2 17.8* 18.3*  HCT 46.6 52.3* 53.9*  PLT 149* 178 194  CREATININE 0.93 0.85 0.87    Estimated Creatinine Clearance: 86.9 mL/min (by C-G formula based on SCr of 0.87 mg/dL).   Medical History: Past Medical History:  Diagnosis Date  . Hyperlipidemia   . Hypertension     Medications:  Scheduled:  . cholecalciferol  1,000 Units Oral Daily  . dexamethasone (DECADRON) injection  6 mg Intravenous Q24H  . enoxaparin (LOVENOX) injection  1 mg/kg Subcutaneous Q12H  . furosemide  60 mg Intravenous Q12H  . mouth rinse  15 mL Mouth Rinse BID  . metoprolol tartrate  12.5 mg Oral BID  . rosuvastatin  20 mg Oral q1800  . sodium chloride flush  3 mL Intravenous Q12H  . vitamin C  500 mg Oral Daily  . zinc sulfate  220 mg Oral Daily   Infusions:   PRN: acetaminophen, ALPRAZolam, HYDROcodone-acetaminophen, influenza vac split quadrivalent PF, labetalol, lip balm, phenol, sodium chloride  Assessment: 63 yo male with COVID-19 pneumonia.  He has been receiving Lovenox 40mg  q24h for prophylaxis.  Pharmacy was consulted to change to full-dose given clinically suspected PE/DVT w/ markedly elevated d-dimer and other concerning clinical signs. LE doppler confirmed DVT. Patient is not deemed to be clinically stable enough for  CTA  Renal function is stable. H/H are elevated and platelets wnl  No bleeding noted.   Goal of Therapy:  Anti-Xa level 0.6-1 units/ml 4hrs after LMWH dose given Monitor platelets by anticoagulation protocol: Yes   Plan:  Continue lovenox 85mg  (1mg /kg) SQ q12h Monitor CBC, signs/symtoms of bleeding  Ulice Dash, PharmD, BCPS Clinical Pharmacist  02/27/2019,8:51 AM

## 2019-02-27 NOTE — Progress Notes (Signed)
Spoke with pt wife, all questions answered.

## 2019-02-27 NOTE — CV Procedure (Signed)
2D echo attempted but patient was not in room.

## 2019-02-27 NOTE — Plan of Care (Signed)
  Problem: Education: Goal: Knowledge of risk factors and measures for prevention of condition will improve Outcome: Progressing   Problem: Coping: Goal: Psychosocial and spiritual needs will be supported Outcome: Progressing   Problem: Respiratory: Goal: Will maintain a patent airway Outcome: Progressing Goal: Complications related to the disease process, condition or treatment will be avoided or minimized Outcome: Progressing   Problem: Clinical Measurements: Goal: Ability to maintain clinical measurements within normal limits will improve Outcome: Progressing Goal: Will remain free from infection Outcome: Progressing Goal: Diagnostic test results will improve Outcome: Progressing Goal: Respiratory complications will improve Outcome: Progressing Goal: Cardiovascular complication will be avoided Outcome: Progressing   Problem: Activity: Goal: Risk for activity intolerance will decrease Outcome: Progressing   Problem: Nutrition: Goal: Adequate nutrition will be maintained Outcome: Progressing   Problem: Elimination: Goal: Will not experience complications related to bowel motility Outcome: Progressing Goal: Will not experience complications related to urinary retention Outcome: Progressing   Problem: Pain Managment: Goal: General experience of comfort will improve Outcome: Progressing   Problem: Safety: Goal: Ability to remain free from injury will improve Outcome: Progressing   Problem: Skin Integrity: Goal: Risk for impaired skin integrity will decrease Outcome: Progressing   Problem: Safety: Goal: Ability to remain free from injury will improve Outcome: Progressing

## 2019-02-27 NOTE — Progress Notes (Signed)
Spoke with son, Jeneen Rinks.  Updated him on Patient condition and plan to transfer to unit for closer observation.

## 2019-02-27 NOTE — Progress Notes (Signed)
[place] for Lovenox>heparin Indication: IVC thrombus  Allergies  Allergen Reactions  . Neomycin Other (See Comments) and Swelling    TOPICAL FORM ONLY.  . Other Other (See Comments)    Soy bean oil causes bronchial congestion, phlegm.  Animal dander.  Seasonal and environmental.  . Sulfur   . Thimerosal     Patient Measurements: Height: 5' 5.5" (166.4 cm) Weight: 187 lb 6.3 oz (85 kg) IBW/kg (Calculated) : 62.65  Vital Signs: Temp: 97.1 F (36.2 C) (12/09 1600) Temp Source: Axillary (12/09 1600) BP: 111/73 (12/09 1800) Pulse Rate: 95 (12/09 1800)  Labs: Recent Labs    02/25/19 0258 02/26/19 0355 02/27/19 0315  HGB 16.2 17.8* 18.3*  HCT 46.6 52.3* 53.9*  PLT 149* 178 194  CREATININE 0.93 0.85 0.87    Estimated Creatinine Clearance: 86.9 mL/min (by C-G formula based on SCr of 0.87 mg/dL).   Medical History: Past Medical History:  Diagnosis Date  . Hyperlipidemia   . Hypertension     Medications:  Scheduled:  . Chlorhexidine Gluconate Cloth  6 each Topical Daily  . cholecalciferol  1,000 Units Oral Daily  . dexamethasone (DECADRON) injection  6 mg Intravenous Q24H  . enoxaparin (LOVENOX) injection  1 mg/kg Subcutaneous Q12H  . insulin aspart  0-9 Units Subcutaneous TID WC  . mouth rinse  15 mL Mouth Rinse BID  . metoprolol tartrate  12.5 mg Oral BID  . pantoprazole  40 mg Oral Daily  . rosuvastatin  20 mg Oral q1800  . sodium chloride flush  3 mL Intravenous Q12H  . vitamin C  500 mg Oral Daily  . zinc sulfate  220 mg Oral Daily   Infusions:  . remdesivir 100 mg in NS 100 mL Stopped (02/27/19 1815)   PRN: acetaminophen, ALPRAZolam, HYDROcodone-acetaminophen, influenza vac split quadrivalent PF, labetalol, lip balm, phenol, sodium chloride  Assessment: 63 yo male with COVID-19 pneumonia.  He has been receiving Lovenox 40mg  q24h for prophylaxis.  Pharmacy was consulted to change to full-dose given clinically  suspected PE/DVT w/ markedly elevated d-dimer and other concerning clinical signs. LE doppler confirmed DVT. Patient is not deemed to be clinically stable enough for CTA  Renal function is stable. H/H are elevated and platelets wnl  No bleeding noted.   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 Pt may need procedure so Lovenox will changed to IV heparin. He got his full dose of lovenox at 1200 today. We will start heparin around 2300 tonight.  Goal of Therapy:  Heparin level 0.3-0.7 Monitor platelets by anticoagulation protocol: Yes   Plan:  Dc lovenox Heparin at 1200 units/hr F/u with heparin level in AM Daily HL and CBC  Onnie Boer, PharmD, BCIDP, AAHIVP, CPP Infectious Disease Pharmacist 02/27/2019 7:18 PM

## 2019-02-28 ENCOUNTER — Other Ambulatory Visit: Payer: Self-pay

## 2019-02-28 LAB — MRSA PCR SCREENING: MRSA by PCR: NEGATIVE

## 2019-02-28 LAB — CBC
HCT: 57 % — ABNORMAL HIGH (ref 39.0–52.0)
Hemoglobin: 19.6 g/dL — ABNORMAL HIGH (ref 13.0–17.0)
MCH: 29.8 pg (ref 26.0–34.0)
MCHC: 34.4 g/dL (ref 30.0–36.0)
MCV: 86.8 fL (ref 80.0–100.0)
Platelets: 180 10*3/uL (ref 150–400)
RBC: 6.57 MIL/uL — ABNORMAL HIGH (ref 4.22–5.81)
RDW: 13 % (ref 11.5–15.5)
WBC: 12.7 10*3/uL — ABNORMAL HIGH (ref 4.0–10.5)
nRBC: 0 % (ref 0.0–0.2)

## 2019-02-28 LAB — COMPREHENSIVE METABOLIC PANEL
ALT: 28 U/L (ref 0–44)
AST: 38 U/L (ref 15–41)
Albumin: 2.8 g/dL — ABNORMAL LOW (ref 3.5–5.0)
Alkaline Phosphatase: 233 U/L — ABNORMAL HIGH (ref 38–126)
Anion gap: 16 — ABNORMAL HIGH (ref 5–15)
BUN: 49 mg/dL — ABNORMAL HIGH (ref 8–23)
CO2: 28 mmol/L (ref 22–32)
Calcium: 8.7 mg/dL — ABNORMAL LOW (ref 8.9–10.3)
Chloride: 91 mmol/L — ABNORMAL LOW (ref 98–111)
Creatinine, Ser: 0.94 mg/dL (ref 0.61–1.24)
GFR calc Af Amer: >60 mL/min (ref >60)
GFR calc non Af Amer: >60 mL/min (ref >60)
Glucose, Bld: 125 mg/dL — ABNORMAL HIGH (ref 70–99)
Potassium: 4.2 mmol/L (ref 3.5–5.1)
Sodium: 135 mmol/L (ref 135–145)
Total Bilirubin: 0.8 mg/dL (ref 0.3–1.2)
Total Protein: 7.3 g/dL (ref 6.5–8.1)

## 2019-02-28 LAB — C-REACTIVE PROTEIN: CRP: 1 mg/dL — ABNORMAL HIGH (ref <1.0)

## 2019-02-28 LAB — GLUCOSE, CAPILLARY
Glucose-Capillary: 161 mg/dL — ABNORMAL HIGH (ref 70–99)
Glucose-Capillary: 197 mg/dL — ABNORMAL HIGH (ref 70–99)
Glucose-Capillary: 194 mg/dL — ABNORMAL HIGH (ref 70–99)
Glucose-Capillary: 175 mg/dL — ABNORMAL HIGH (ref 70–99)

## 2019-02-28 LAB — HEPARIN LEVEL (UNFRACTIONATED): Heparin Unfractionated: 1.2 IU/mL — ABNORMAL HIGH (ref 0.30–0.70)

## 2019-02-28 LAB — D-DIMER, QUANTITATIVE: D-Dimer, Quant: 7.92 ug/mL-FEU — ABNORMAL HIGH (ref 0.00–0.50)

## 2019-02-28 LAB — MAGNESIUM: Magnesium: 2.6 mg/dL — ABNORMAL HIGH (ref 1.7–2.4)

## 2019-02-28 LAB — FERRITIN: Ferritin: 1371 ng/mL — ABNORMAL HIGH (ref 24–336)

## 2019-02-28 MED ORDER — ENOXAPARIN SODIUM 100 MG/ML ~~LOC~~ SOLN
1.0000 mg/kg | Freq: Two times a day (BID) | SUBCUTANEOUS | Status: DC
  Administered 2019-02-28: 100 mg via SUBCUTANEOUS
  Administered 2019-02-28 – 2019-03-04 (×8): 85 mg via SUBCUTANEOUS
  Filled 2019-02-28 (×12): qty 1

## 2019-02-28 NOTE — Plan of Care (Signed)
Discussed with patient plan of care for the evening, pain management and the importance of deep breathing with some teach back displayed.  Talked to patient's son Jeneen Rinks and answered all his questions at this time.

## 2019-02-28 NOTE — Progress Notes (Signed)
Pt transported from [facility] to 1C-165 on non-rebreather with RN. "Upon arrival to new room pt placed back on heated high flow with NRB over top. Pt tolerated well. RT will continue to monitor.

## 2019-02-28 NOTE — Progress Notes (Signed)
CT venogram of the chest abdomen and pelvis showed no evidence of IVC thrombus-spoke with Dr. Paula Compton continued anticoagulation-no indication for thrombolytics/vascular procedure.

## 2019-02-28 NOTE — Progress Notes (Signed)
ANTICOAGULATION CONSULT NOTE  Pharmacy Consult for Lovenox>heparin Indication: IVC thrombus   Patient Measurements: Height: 5' 5.5" (166.4 cm) Weight: 187 lb 6.3 oz (85 kg) IBW/kg (Calculated) : 62.65  Vital Signs: Temp: 97.8 F (36.6 C) (12/10 0841) Temp Source: Oral (12/10 0841) BP: 113/69 (12/10 1100) Pulse Rate: 95 (12/10 1100)  Labs: Recent Labs    02/26/19 0355 02/27/19 0315 02/28/19 0355 02/28/19 0550  HGB 17.8* 18.3* 19.6*  --   HCT 52.3* 53.9* 57.0*  --   PLT 178 194 180  --   HEPARINUNFRC  --   --   --  1.20*  CREATININE 0.85 0.87 0.94  --     Estimated Creatinine Clearance: 80.4 mL/min (by C-G formula based on SCr of 0.94 mg/dL).   Medical History: Past Medical History:  Diagnosis Date  . Hyperlipidemia   . Hypertension     Medications:  Scheduled:  . Chlorhexidine Gluconate Cloth  6 each Topical Daily  . cholecalciferol  1,000 Units Oral Daily  . dexamethasone (DECADRON) injection  6 mg Intravenous Q24H  . enoxaparin (LOVENOX) injection  1 mg/kg Subcutaneous Q12H  . insulin aspart  0-9 Units Subcutaneous TID WC  . mouth rinse  15 mL Mouth Rinse BID  . metoprolol tartrate  12.5 mg Oral BID  . pantoprazole  40 mg Oral Daily  . rosuvastatin  20 mg Oral q1800  . sodium chloride flush  3 mL Intravenous Q12H  . vitamin C  500 mg Oral Daily  . zinc sulfate  220 mg Oral Daily   Infusions:  . remdesivir 100 mg in NS 100 mL 100 mg (02/28/19 0937)   PRN: acetaminophen, ALPRAZolam, HYDROcodone-acetaminophen, influenza vac split quadrivalent PF, labetalol, lip balm, phenol, sodium chloride  Assessment: 63 yo male with COVID-19 pneumonia.  He has been receiving Lovenox 40mg  q24h for prophylaxis.  Pharmacy was consulted to change to full-dose given clinically suspected PE/DVT w/ markedly elevated d-dimer and other concerning clinical signs. LE doppler confirmed DVT. Patient is not deemed to be clinically stable enough for CTA  Pt was noted to have IVC  thrombus on ECHO. It was unclear yesterday if patient was going to require a procedure, so patient was transitioned to heparin gtt. Now IVC thrombus was ruled out so patient will be put back on Lovenox.  Goal of Therapy:  Heparin level 0.3-0.7 Monitor platelets by anticoagulation protocol: Yes   Plan:  -Stop heparin  -Begin Lovenox 85 mg [place] 1 hour after heparin has been off    Harvel Quale 02/28/2019 11:55 AM

## 2019-02-28 NOTE — Progress Notes (Signed)
Patient's son Jeneen Rinks) was called and updated about his farther's current status. Patient was allowed to [facility] with son. All question were answered.

## 2019-02-28 NOTE — Progress Notes (Signed)
PROGRESS NOTE  Alexander Erickson  ZOX:[id number] DOB: May 25, 1955 DOA: 03/07/19 PCP: Patient, No Pcp Per   Brief Narrative: Alexander Erickson is a 63 y.o. male with a history of HTN and HLD who presented with 1 week of progressive cough and dyspnea found to be hypoxic with covid-19 (initial positive 11/30), bilateral infiltrates on CXR, severely elevated inflammatory markers and d-dimer >20 as well as severe hyponatremia. He was admitted to [facility], started on steroids and remdesivir. RLE DVT was discovered for which therapeutic anticoagulation was given. Patient not stable for CTA chest to r/o PE, so echocardiogram performed which did not show RV strain, but suggested IVC clot. Vascular surgery was consulted and recommended CT venogram which did not show any clots. Having required 15L NRB since admission, he was transferred to ICU 12/9 to receive HHF and has stabilized. He reports subjective improvement and will be transferred to PCU to continue HHF.   Assessment & Plan: Principal Problem:   Pneumonia due to COVID-19 virus Active Problems:   Acute respiratory failure with hypoxia (HCC)   Hyponatremia   Prolonged QT interval  Acute Hypoxic Resp Failure due to Covid 19 Viral pneumonia and possible concurrent bacterial pneumonia:  - Remains at risk for decompensation requiring intubation, but has stabilized on heated high flow which we will continue. He has been stable x24 hours and does not have demonstrable IVC clot so is at less risk of imminent death. We will transfer downstairs to continue HHF, currently at 40L and 100% FiO2.  - Continue remdesivir (10 days duration extended due to tenuous clinical situation) - Continue steroids - Received tocilizumab 12/6.  - Completed 5 days ceftriaxone. Leukocytosis difficult to interpret in setting of decreasing CRP, concurrent steroids, and known thrombus. Will continue monitoring. - Continue trending inflammatory markers, showing sustained improvement. -  Therapeutic anticoagulation as below - Encouraged to prone as much as possible, continue IS, FV.  - Keep euvolemic. Will hold diuresis today as he has no evidence of peripheral volume overload. LV systolic function hyperdynamic by echo.  Right lower extremity DVT: With initial concern for IVC clot based on echo, not found on subsequent CTV. DVT not terribly symptomatic. - Transition back to lovenox therapeutic dosing.   Hyponatremia: Improving. Na normalized 12/10.  - Continue monitoring.  T2DM: Suggested by HbA1c 6.8%. With hyperglycemia.  - Continue AC monitoring and SSI to optimize control  Dyslipidemia:  - Continue statin  Prolonged QT interval: QTc is this AM.  - Continue telemetry monitoring, avoid provocative agents.   Obesity: Estimated body mass index is 30.71 kg/m as calculated from the following:   Height as of this encounter: 5' 5.5" (1.664 m).   Weight as of this encounter: 85 kg.    DVT prophylaxis: Lovenox Code Status: Full Family Communication: None at bedside Disposition Plan: Transfer to PCU on [facility]  Consultants:   PCCM curbside  Vascular surgery, Dr. Randie Heinz by phone  Procedures:   Echocardiogram   Antimicrobials:  Azithromycin, doxycycline x1  Ceftriaxone x5 days  Remdesivir planned 10 day course   Subjective: Feels stable, improving dyspnea at rest over past 24 hours. Very thirsty, says it's easier to suction sputum from cough when he's more hydrated. Sleeping on sides, found it difficult to prone last night due to left shoulder discomfort (chronic s/p rotator cuff tear and surgery). No chest pain, denies leg swelling or pain.   Objective: Vitals:   02/28/19 0400 02/28/19 0500 02/28/19 0600 02/28/19 0700  BP: 126/90 122/83 129/73 132/79  Pulse: 92 92 97 97  Resp: (!) 22 (!) 23 (!) 22 16  Temp: 97.9 F (36.6 C)     TempSrc: Axillary     SpO2: 93% 93% 92% 92%  Weight:      Height:        Intake/Output Summary (Last 24  hours) at 02/28/2019 0826 Last data filed at 02/28/2019 0600 Gross per 24 hour  Intake 621.58 ml  Output 1525 ml  Net -903.42 ml   Filed Weights   03-15-19 2000 02/24/19 0532  Weight: 85 kg 85 kg    Gen: 63 y.o. male in no distress Pulm: Non-labored breathing HHF also has NRB, SpO2 92%. Speaking in full sentences, can recite alphabet to the letter S in one breath. Good aeration bilaterally with nondependent crackles and no wheezes.  CV: Regular rate and rhythm. No murmur, rub, or gallop. No JVD, no significant pedal edema. GI: Abdomen soft, non-tender, non-distended, with normoactive bowel sounds. No organomegaly or masses felt. Ext: Warm, no deformities Skin: No rashes, lesions or ulcers Neuro: Alert and oriented. No focal neurological deficits. Psych: Judgement and insight appear normal. Mood & affect appropriate.   Data Reviewed: I have personally reviewed following labs and imaging studies  CBC: Recent Labs  Lab 02/23/19 0307 02/24/19 0219 02/25/19 0258 02/26/19 0355 02/27/19 0315 02/28/19 0355  WBC 7.5 9.0 6.1 8.5 9.1 12.7*  NEUTROABS 6.5 8.0* 5.4 7.5 8.1*  --   HGB 14.8 16.0 16.2 17.8* 18.3* 19.6*  HCT 42.4 46.0 46.6 52.3* 53.9* 57.0*  MCV 84.6 84.2 85.5 86.9 86.5 86.8  PLT 209 194 149* 178 194 027   Basic Metabolic Panel: Recent Labs  Lab 02/23/19 0307 02/24/19 0219 02/25/19 0258 02/26/19 0355 02/27/19 0315 02/28/19 0355  NA 125* 129* 130* 128* 133* 135  K 3.9 4.0 3.9 4.1 3.8 4.2  CL 86* 85* 87* 82* 83* 91*  CO2 23 25 27 26  32 28  GLUCOSE 122* 122* 135* 113* 114* 125*  BUN 20 27* 30* 35* 41* 49*  CREATININE 0.77 0.90 0.93 0.85 0.87 0.94  CALCIUM 8.1* 8.4* 8.2* 7.9* 8.6* 8.7*  MG 2.1 2.2 2.5* 2.5* 2.5* 2.6*  PHOS 3.0 3.3 3.7  --   --   --    GFR: Estimated Creatinine Clearance: 80.4 mL/min (by C-G formula based on SCr of 0.94 mg/dL). Liver Function Tests: Recent Labs  Lab 02/24/19 0219 02/25/19 0258 02/26/19 0355 02/27/19 0315 02/28/19 0355    AST 55* 46* 47* 40 38  ALT 51* 37 36 34 28  ALKPHOS 154* 175* 215* 239* 233*  BILITOT 0.6 0.6 0.7 1.0 0.8  PROT 7.2 6.8 6.6 7.6 7.3  ALBUMIN 2.7* 2.3* 2.4* 2.8* 2.8*   No results for input(s): LIPASE, AMYLASE in the last 168 hours. No results for input(s): AMMONIA in the last 168 hours. Coagulation Profile: No results for input(s): INR, PROTIME in the last 168 hours. Cardiac Enzymes: No results for input(s): CKTOTAL, CKMB, CKMBINDEX, TROPONINI in the last 168 hours. BNP (last 3 results) No results for input(s): PROBNP in the last 8760 hours. HbA1C: Recent Labs    02/27/19 0315  HGBA1C 6.8*   CBG: Recent Labs  Lab 02/27/19 1624 02/28/19 0741  GLUCAP 187* 161*   Lipid Profile: No results for input(s): CHOL, HDL, LDLCALC, TRIG, CHOLHDL, LDLDIRECT in the last 72 hours. Thyroid Function Tests: No results for input(s): TSH, T4TOTAL, FREET4, T3FREE, THYROIDAB in the last 72 hours. Anemia Panel: Recent Labs    02/27/19 0315  02/28/19 0355  FERRITIN 1,178* 1,371*   Urine analysis:    Component Value Date/Time   COLORURINE YELLOW (A) 03/05/2019 1436   APPEARANCEUR CLEAR (A) 03/03/2019 1436   LABSPEC 1.029 02/28/2019 1436   PHURINE 5.0 03/09/2019 1436   GLUCOSEU NEGATIVE 03/16/2019 1436   HGBUR NEGATIVE 03/09/2019 1436   BILIRUBINUR NEGATIVE 03/01/2019 1436   KETONESUR 20 (A) 03/21/2019 1436   PROTEINUR 100 (A) 02/28/2019 1436   NITRITE NEGATIVE 03/10/2019 1436   LEUKOCYTESUR NEGATIVE 02/27/2019 1436   Recent Results (from the past 240 hour(s))  Novel Coronavirus, NAA ([facility])     Status: Abnormal   Collection Time: 02/18/19  1:26 PM   Specimen: Nasopharyngeal(NP) swabs in vial transport medium   NASOPHARYNGE  TESTING  Result Value Ref Range Status   SARS-CoV-2, NAA Detected (A) Not Detected Final    Comment: This nucleic acid amplification test was developed and its performance characteristics determined by [facility]. Nucleic acid amplification tests  include PCR and TMA. This test has not been FDA cleared or approved. This test has been authorized by FDA under an Emergency Use Authorization (EUA). This test is only authorized for the duration of time the declaration that circumstances exist justifying the authorization of the emergency use of in vitro diagnostic tests for detection of SARS-CoV-2 virus and/or diagnosis of COVID-19 infection under section 564(b)(1) of the Act, 21 U.S.C. 409WJX-9(J360bbb-3(b) (1), unless the authorization is terminated or revoked sooner. When diagnostic testing is negative, the possibility of a false negative result should be considered in the context of a patient's recent exposures and the presence of clinical signs and symptoms consistent with COVID-19. An individual without symptoms of COVID-19 and who is not shedding SARS-CoV-2 virus would  expect to have a negative (not detected) result in this assay.   Culture, sputum-assessment     Status: None   Collection Time: 02/23/19  5:00 AM   Specimen: Sputum  Result Value Ref Range Status   Specimen Description SPUTUM  Final   Special Requests NONE  Final   Sputum evaluation   Final    THIS SPECIMEN IS ACCEPTABLE FOR SPUTUM CULTURE Performed at [facility], [address] [place], [place] [address]    Report Status 02/23/2019 FINAL  Final  Culture, respiratory     Status: None   Collection Time: 02/23/19  5:00 AM   Specimen: SPU  Result Value Ref Range Status   Specimen Description   Final    SPUTUM Performed at [facility], [address] [place], [place] [address]    Special Requests   Final    NONE Reflexed from [id number] Performed at [facility], [address] [place], [place] [address]    Gram Stain   Final    RARE WBC PRESENT, PREDOMINANTLY PMN FEW GRAM POSITIVE COCCI IN CLUSTERS FEW GRAM NEGATIVE RODS FEW GRAM POSITIVE RODS    Culture   Final    FEW Consistent with normal  respiratory flora. Performed at [facility], [address] [place], [place] [address]    Report Status 02/25/2019 FINAL  Final  MRSA PCR Screening     Status: None   Collection Time: 02/28/19  4:31 AM   Specimen: Nasal Mucosa; Nasopharyngeal  Result Value Ref Range Status   MRSA by PCR NEGATIVE NEGATIVE Final    Comment:        The GeneXpert MRSA Assay (FDA approved for NASAL specimens only), is one component of  a comprehensive MRSA colonization surveillance program. It is not intended to diagnose MRSA infection nor to guide or monitor treatment for MRSA infections. Performed at [facility], [address], [place], [place] [address]       Radiology Studies: CT CHEST W CONTRAST  Result Date: 02/28/2019 CLINICAL DATA:  Cardiac thrombus or embolic source suspected. Concern for thrombus within the proximal IVC seen on ECHO. EXAM: CT CHEST, ABDOMEN, AND PELVIS WITH CONTRAST TECHNIQUE: Multidetector CT imaging of the chest, abdomen and pelvis was performed following the standard protocol during bolus administration of intravenous contrast. CONTRAST:  OMNIPAQUE IOHEXOL 350 MG/ML SOLN COMPARISON:  None. FINDINGS: CT CHEST FINDINGS Cardiovascular: There is no large centrally located pulmonary embolism. The heart size is normal. There is no significant pericardial effusion. Coronary artery calcifications are noted. Minimal atherosclerotic changes are noted of the thoracic aorta without evidence for a thoracic aortic aneurysm. Mediastinum/Nodes: --there are prominent mediastinal and hilar lymph nodes, likely reactive. --No axillary lymphadenopathy. --No supraclavicular lymphadenopathy. --Normal thyroid gland. --The esophagus is unremarkable Lungs/Pleura: There are diffuse bilateral ground-glass airspace opacities involving all lobes to relatively equal degree. There is no pneumothorax. No significant pleural effusion. Musculoskeletal: No chest wall abnormality. No  acute or significant osseous findings. Review of the MIP images confirms the above findings. CT ABDOMEN PELVIS FINDINGS Hepatobiliary: The liver is normal. Normal gallbladder.There is no biliary ductal dilation. Pancreas: Normal contours without ductal dilatation. No peripancreatic fluid collection. Spleen: No splenic laceration or hematoma. Adrenals/Urinary Tract: --Adrenal glands: No adrenal hemorrhage. --Right kidney/ureter: No hydronephrosis or perinephric hematoma. --Left kidney/ureter: No hydronephrosis or perinephric hematoma. --Urinary bladder: Unremarkable. Stomach/Bowel: --Stomach/Duodenum: No hiatal hernia or other gastric abnormality. Normal duodenal course and caliber. --Small bowel: No dilatation or inflammation. --Colon: No focal abnormality. --Appendix: Normal. Vascular/Lymphatic: Normal course and caliber of the major abdominal vessels. There is no evidence for a thrombus within the IVC. --No retroperitoneal lymphadenopathy. --No mesenteric lymphadenopathy. --No pelvic or inguinal lymphadenopathy. Reproductive: Unremarkable Other: No ascites or free air. 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 Musculoskeletal. No acute displaced fractures. IMPRESSION: 1. No CT evidence for IVC thrombus. 2. Diffuse bilateral ground-glass airspace opacities consistent with the patient's history of viral pneumonia. 3. Prominent mediastinal and hilar lymph nodes, likely reactive. Aortic Atherosclerosis (ICD10-I70.0). Electronically Signed   By: Katherine Mantle M.D.   On: 02/28/2019 00:45   ECHOCARDIOGRAM COMPLETE  Result Date: 02/27/2019   ECHOCARDIOGRAM REPORT   Patient Name:   MIKAIL GOOSTREE Date of Exam: 02/27/2019 Medical Rec #:  [id number]    Height:       65.5 in Accession #:    [id number]   Weight:       187.4 lb Date of Birth:  [birth year]   BSA:          1.94 m Patient Age:    63 years     BP:           109/71 mmHg Patient Gender: M            HR:           102 bpm. Exam Location:  Inpatient  Procedure: 2D Echo, Cardiac Doppler and Color Doppler Indications:    Acute respiratory failure.  History:        Patient has no prior history of Echocardiogram examinations.                 Abnormal ECG. Covid 19 positive. Hypoxia. Pneumonia.  Sonographer:    Inetta Fermo  [facility] RDCS Referring Phys: [id number] [facility]  Sonographer Comments: Technically difficult study due to poor echo windows. IMPRESSIONS  1. There is a mass like structure present in the IVC. This prevents collpase of the IVC during the examination. Due to the patient's history of covid-19 infection and LE DVT, this likely represents a thrombus. RV size and function are normal.  2. Left ventricular ejection fraction, by visual estimation, is >75%. The left ventricle has hyperdynamic function. There is mildly increased left ventricular hypertrophy.  3. Small left ventricular internal cavity size.  4. The left ventricle has no regional wall motion abnormalities.  5. Global right ventricle has normal systolic function.The right ventricular size is normal. No increase in right ventricular wall thickness.  6. Left atrial size was normal.  7. Right atrial size was normal.  8. Presence of pericardial fat pad.  9. The pericardial effusion is circumferential. 10. Trivial pericardial effusion is present. 11. The mitral valve is grossly normal. No evidence of mitral valve regurgitation. 12. The tricuspid valve is grossly normal. Tricuspid valve regurgitation is trivial. 13. The aortic valve is grossly normal. Aortic valve regurgitation is not visualized. No evidence of aortic valve sclerosis or stenosis. 14. The pulmonic valve was grossly normal. Pulmonic valve regurgitation is not visualized. 15. TR signal is inadequate for assessing pulmonary artery systolic pressure. 16. 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 17. The inferior vena cava is normal in size with <50% respiratory variability, suggesting right atrial pressure of 8 mmHg. FINDINGS  Left Ventricle: Left  ventricular ejection fraction, by visual estimation, is >75%. The left ventricle has hyperdynamic function. The left ventricle has no regional wall motion abnormalities. The left ventricular internal cavity size was the LV cavity size is small. There is mildly increased left ventricular hypertrophy. Concentric left ventricular hypertrophy. Left ventricular diastolic parameters are consistent with age-related delayed relaxation (normal). Right Ventricle: The right ventricular size is normal. No increase in right ventricular wall thickness. Global RV systolic function is has normal systolic function. Left Atrium: Left atrial size was normal in size. Right Atrium: Right atrial size was normal in size Pericardium: Trivial pericardial effusion is present. The pericardial effusion is circumferential. Presence of pericardial fat pad. Mitral Valve: The mitral valve is grossly normal. No evidence of mitral valve regurgitation. Tricuspid Valve: The tricuspid valve is grossly normal. Tricuspid valve regurgitation is trivial. Aortic Valve: The aortic valve is grossly normal. Aortic valve regurgitation is not visualized. The aortic valve is structurally normal, with no evidence of sclerosis or stenosis. Pulmonic Valve: The pulmonic valve was grossly normal. Pulmonic valve regurgitation is not visualized. Pulmonic regurgitation is not visualized. Aorta: The aortic root is normal in size and structure. Venous: The inferior vena cava is normal in size with less than 50% respiratory variability, suggesting right atrial pressure of 8 mmHg. There is mass detected in the inferior vena cava. IAS/Shunts: No atrial level shunt detected by color flow Doppler.  LEFT VENTRICLE PLAX 2D LVIDd:         2.67 cm       Diastology LVIDs:         1.94 cm       LV e' lateral:   7.29 cm/s LV PW:         1.58 cm       LV E/e' lateral: 7.6 LV IVS:        1.20 cm       LV e' medial:    5.22 cm/s LVOT  diam:     1.60 cm       LV E/e' medial:  10.6 LV SV:          15 ml LV SV Index:   7.22 LVOT Area:     2.01 cm  LV Volumes (MOD) LV area d, A2C:    12.70 cm LV area d, A4C:    17.80 cm LV area s, A2C:    8.18 cm LV area s, A4C:    9.86 cm LV major d, A2C:   4.92 cm LV major d, A4C:   6.84 cm LV major s, A2C:   4.51 cm LV major s, A4C:   5.77 cm LV vol d, MOD A2C: 27.7 ml LV vol d, MOD A4C: 40.2 ml LV vol s, MOD A2C: 12.9 ml LV vol s, MOD A4C: 14.3 ml LV SV MOD A2C:     14.8 ml LV SV MOD A4C:     40.2 ml LV SV MOD BP:      24.0 ml RIGHT VENTRICLE             IVC RV S prime:     22.40 cm/s  IVC diam: 1.53 cm TAPSE (M-mode): 1.7 cm LEFT ATRIUM             Index       RIGHT ATRIUM           Index LA diam:        2.40 cm 1.24 cm/m  RA Area:     13.30 cm LA Vol (A2C):   22.5 ml 11.63 ml/m RA Volume:   34.80 ml  17.98 ml/m LA Vol (A4C):   27.4 ml 14.16 ml/m LA Biplane Vol: 26.4 ml 13.64 ml/m  AORTIC VALVE LVOT Vmax:   133.00 cm/s LVOT Vmean:  105.000 cm/s LVOT VTI:    0.211 m  AORTA Ao Root diam: 2.90 cm MITRAL VALVE MV Area (PHT): 2.39 cm             SHUNTS MV PHT:        91.93 msec           Systemic VTI:  0.21 m MV Decel Time: 317 msec             Systemic Diam: 1.60 cm MV E velocity: 55.30 cm/s 103 cm/s MV A velocity: 89.20 cm/s 70.3 cm/s MV E/A ratio:  0.62       1.5  Lennie Odor MD Electronically signed by Lennie Odor MD Signature Date/Time: 02/27/2019/5:55:17 PM    Final    CT VENOGRAM ABD/PEL  Result Date: 02/28/2019 CLINICAL DATA:  Cardiac thrombus or embolic source suspected. Concern for thrombus within the proximal IVC seen on ECHO. EXAM: CT CHEST, ABDOMEN, AND PELVIS WITH CONTRAST TECHNIQUE: Multidetector CT imaging of the chest, abdomen and pelvis was performed following the standard protocol during bolus administration of intravenous contrast. CONTRAST:  OMNIPAQUE IOHEXOL 350 MG/ML SOLN COMPARISON:  None. FINDINGS: CT CHEST FINDINGS Cardiovascular: There is no large centrally located pulmonary embolism. The heart size is normal. There is  no significant pericardial effusion. Coronary artery calcifications are noted. Minimal atherosclerotic changes are noted of the thoracic aorta without evidence for a thoracic aortic aneurysm. Mediastinum/Nodes: --there are prominent mediastinal and hilar lymph nodes, likely reactive. --No axillary lymphadenopathy. --No supraclavicular lymphadenopathy. --Normal thyroid gland. --The esophagus is unremarkable Lungs/Pleura: There are diffuse bilateral ground-glass airspace opacities involving all lobes to relatively equal degree. There is no pneumothorax. No significant pleural  effusion. Musculoskeletal: No chest wall abnormality. No acute or significant osseous findings. Review of the MIP images confirms the above findings. CT ABDOMEN PELVIS FINDINGS Hepatobiliary: The liver is normal. Normal gallbladder.There is no biliary ductal dilation. Pancreas: Normal contours without ductal dilatation. No peripancreatic fluid collection. Spleen: No splenic laceration or hematoma. Adrenals/Urinary Tract: --Adrenal glands: No adrenal hemorrhage. --Right kidney/ureter: No hydronephrosis or perinephric hematoma. --Left kidney/ureter: No hydronephrosis or perinephric hematoma. --Urinary bladder: Unremarkable. Stomach/Bowel: --Stomach/Duodenum: No hiatal hernia or other gastric abnormality. Normal duodenal course and caliber. --Small bowel: No dilatation or inflammation. --Colon: No focal abnormality. --Appendix: Normal. Vascular/Lymphatic: Normal course and caliber of the major abdominal vessels. There is no evidence for a thrombus within the IVC. --No retroperitoneal lymphadenopathy. --No mesenteric lymphadenopathy. --No pelvic or inguinal lymphadenopathy. Reproductive: Unremarkable Other: No ascites or free air. 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 Musculoskeletal. No acute displaced fractures. IMPRESSION: 1. No CT evidence for IVC thrombus. 2. Diffuse bilateral ground-glass airspace opacities consistent with the  patient's history of viral pneumonia. 3. Prominent mediastinal and hilar lymph nodes, likely reactive. Aortic Atherosclerosis (ICD10-I70.0). Electronically Signed   By: Katherine Mantle M.D.   On: 02/28/2019 00:45    Scheduled Meds:  Chlorhexidine Gluconate Cloth  6 each Topical Daily   cholecalciferol  1,000 Units Oral Daily   dexamethasone (DECADRON) injection  6 mg Intravenous Q24H   insulin aspart  0-9 Units Subcutaneous TID WC   mouth rinse  15 mL Mouth Rinse BID   metoprolol tartrate  12.5 mg Oral BID   pantoprazole  40 mg Oral Daily   rosuvastatin  20 mg Oral q1800   sodium chloride flush  3 mL Intravenous Q12H   vitamin C  500 mg Oral Daily   zinc sulfate  220 mg Oral Daily   Continuous Infusions:  heparin 1,200 Units/hr (02/28/19 0600)   remdesivir 100 mg in NS 100 mL Stopped (02/27/19 1815)     LOS: 6 days   Time spent: 35 minutes.  Tyrone Nine, MD [facility] [email or web address] 02/28/2019, 8:26 AM

## 2019-03-01 LAB — CBC
HCT: 55.7 % — ABNORMAL HIGH (ref 39.0–52.0)
Hemoglobin: 18.9 g/dL — ABNORMAL HIGH (ref 13.0–17.0)
MCH: 29.9 pg (ref 26.0–34.0)
MCHC: 33.9 g/dL (ref 30.0–36.0)
MCV: 88.1 fL (ref 80.0–100.0)
Platelets: 171 10*3/uL (ref 150–400)
RBC: 6.32 MIL/uL — ABNORMAL HIGH (ref 4.22–5.81)
RDW: 12.8 % (ref 11.5–15.5)
WBC: 14.3 10*3/uL — ABNORMAL HIGH (ref 4.0–10.5)
nRBC: 0.1 % (ref 0.0–0.2)

## 2019-03-01 LAB — COMPREHENSIVE METABOLIC PANEL
ALT: 27 U/L (ref 0–44)
AST: 39 U/L (ref 15–41)
Albumin: 2.7 g/dL — ABNORMAL LOW (ref 3.5–5.0)
Alkaline Phosphatase: 213 U/L — ABNORMAL HIGH (ref 38–126)
Anion gap: 17 — ABNORMAL HIGH (ref 5–15)
BUN: 46 mg/dL — ABNORMAL HIGH (ref 8–23)
CO2: 29 mmol/L (ref 22–32)
Calcium: 8.7 mg/dL — ABNORMAL LOW (ref 8.9–10.3)
Chloride: 90 mmol/L — ABNORMAL LOW (ref 98–111)
Creatinine, Ser: 0.94 mg/dL (ref 0.61–1.24)
GFR calc Af Amer: >60 mL/min (ref >60)
GFR calc non Af Amer: >60 mL/min (ref >60)
Glucose, Bld: 106 mg/dL — ABNORMAL HIGH (ref 70–99)
Potassium: 4.1 mmol/L (ref 3.5–5.1)
Sodium: 136 mmol/L (ref 135–145)
Total Bilirubin: 1.2 mg/dL (ref 0.3–1.2)
Total Protein: 7.1 g/dL (ref 6.5–8.1)

## 2019-03-01 LAB — GLUCOSE, CAPILLARY
Glucose-Capillary: 200 mg/dL — ABNORMAL HIGH (ref 70–99)
Glucose-Capillary: 134 mg/dL — ABNORMAL HIGH (ref 70–99)
Glucose-Capillary: 207 mg/dL — ABNORMAL HIGH (ref 70–99)
Glucose-Capillary: 174 mg/dL — ABNORMAL HIGH (ref 70–99)

## 2019-03-01 LAB — FERRITIN: Ferritin: 1244 ng/mL — ABNORMAL HIGH (ref 24–336)

## 2019-03-01 LAB — D-DIMER, QUANTITATIVE: D-Dimer, Quant: 5.6 ug/mL-FEU — ABNORMAL HIGH (ref 0.00–0.50)

## 2019-03-01 LAB — C-REACTIVE PROTEIN: CRP: 0.8 mg/dL (ref <1.0)

## 2019-03-01 NOTE — Progress Notes (Signed)
PROGRESS NOTE                                                                                                                                                                                                             Patient Demographics:    Alexander Erickson, is a 63 y.o. male, DOB - 07/27/1955, ZOX:[id number]  Outpatient Primary MD for the patient is Patient, No Pcp Per   Admit date - 03/03/2019   LOS - 7  No chief complaint on file.      Brief Narrative: Patient is a 63 y.o. male with PMHx of HTN, HLD-who presented with 1 week history of progressive cough and shortness of breath-he was found to have acute hypoxemic respiratory failure in the setting of COVID-19 pneumonia along with severe hyponatremia.   Subjective:    Alexander Erickson remains stable-he is still on heated high flow and NRB.   Assessment  & Plan :   Acute Hypoxic Resp Failure due to Covid 19 Viral pneumonia and possible concurrent bacterial pneumonia: Remains stable but tenuous-on NRB and high flow oxygen.  Not in any distress.  Monitor closely-if he develops worsening hypoxemia and or respiratory distress-he will need to be transferred to the ICU.  Plans are to continue with steroids and remdesivir.    Fever: afebrile  O2 requirements:  SpO2: 92 % O2 Flow Rate (L/min): 15 L/min + 100% O2 via NRB FiO2 (%): 100 %   COVID-19 Labs: Recent Labs    02/27/19 0315 02/28/19 0355 03/01/19 0328  DDIMER 10.45* 7.92* 5.60*  FERRITIN 1,178* 1,371* 1,244*  CRP 2.7* 1.0* 0.8       Component Value Date/Time   BNP 53.0 03/13/2019 1234    Recent Labs  Lab 03/04/2019 1437 02/23/19 0307 02/24/19 0219  PROCALCITON 0.28 0.28 0.37    Lab Results  Component Value Date   SARSCOV2NAA Detected (A) 02/18/2019     COVID-19 Medications: Decadron 12/4 > Remdesivir 12/4 >  Actemra 12/6  Other medications: Diuretics:Euvolemic-continue Lasix to maintain  negative balance Antibiotics: Completed Rocephin x5 days on 12/8-given elevated procalcitonin level Insulin: CBG stable on SSI.  A1c 6.8-May need initiation of metformin on discharge.   Prone/Incentive Spirometry: encouraged patient to lie prone for 3-4 hours at a time for a total of 16 hours a day, and to encourage incentive spirometry use  3-4/hour  DVT Prophylaxis  :  Lovenox -therapeutic dosing  Right lower extremity DVT with possible IVC clot seen on echo: Continue full dose Lovenox.  CT venogram of the chest/abdomen and pelvis did not show any IVC clot, had discussed case with [facility]-Dr. Towanda Malkinain-who at this time recommends continuing anticoagulation without the need for thrombolytics or any other vascular procedures.  .  Hyponatremia: Improving-continue cautious diuresis.  Dyslipidemia: Continue statin  Prolonged QTC: Improved-continue to follow periodically.  Avoid QTC prolonging agents, follow potassium and magnesium  Obesity: Estimated body mass index is 30.71 kg/m as calculated from the following:   Height as of this encounter: 5' 5.5" (1.664 m).   Weight as of this encounter: 85 kg.    GI prophylaxis: PPI  Consults  :  None  Procedures  :  None  ABG: No results found for: PHART, PCO2ART, PO2ART, HCO3, TCO2, ACIDBASEDEF, O2SAT  Vent Settings: N/a  Condition - Extremely Guarded-very tenuous with risk for further deterioration  Family Communication  :  Son updated over the phone 12/11  Code Status :  Full Code  Diet :  Diet Order            Diet Carb Modified Fluid consistency: Thin; Room service appropriate? Yes  Diet effective now               Disposition Plan  :  Remain hospitalized-transfer to ICU for close monitoring-and for heated high flow.  Barriers to discharge: Hypoxia requiring O2 supplementation  Antimicorbials  :    Anti-infectives (From admission, onward)   Start     Dose/Rate Route Frequency Ordered Stop   02/27/19 1500  remdesivir 100 mg in  sodium chloride 0.9 % 100 mL IVPB    Note to Pharmacy: To complete a total of 10 days of therapy   100 mg 200 mL/hr over 30 Minutes Intravenous Daily 02/27/19 1320 03/04/19 0959   02/23/19 1000  remdesivir 100 mg in sodium chloride 0.9 % 100 mL IVPB  Status:  Discontinued     100 mg 200 mL/hr over 30 Minutes Intravenous Daily 03/16/2019 2057 02/23/2019 2102   02/23/19 1000  remdesivir 100 mg in sodium chloride 0.9 % 100 mL IVPB     100 mg 200 mL/hr over 30 Minutes Intravenous Daily 03/10/2019 2104 02/26/19 0915   03/16/2019 2315  doxycycline (VIBRA-TABS) tablet 100 mg  Status:  Discontinued     100 mg Oral Every 12 hours 03/05/2019 2306 02/23/19 1657   03/20/2019 2100  remdesivir 200 mg in sodium chloride 0.9% 250 mL IVPB  Status:  Discontinued     200 mg 580 mL/hr over 30 Minutes Intravenous Once 03/19/2019 2057 02/27/2019 2102   03/02/2019 2100  cefTRIAXone (ROCEPHIN) 2 g in sodium chloride 0.9 % 100 mL IVPB     2 g 200 mL/hr over 30 Minutes Intravenous Every 24 hours 02/28/2019 2058 02/26/19 2215   03/06/2019 2100  azithromycin (ZITHROMAX) tablet 500 mg  Status:  Discontinued     500 mg Oral Daily 02/25/2019 2058 03/03/2019 2306      Inpatient Medications  Scheduled Meds: . Chlorhexidine Gluconate Cloth  6 each Topical Daily  . cholecalciferol  1,000 Units Oral Daily  . dexamethasone (DECADRON) injection  6 mg Intravenous Q24H  . enoxaparin (LOVENOX) injection  1 mg/kg Subcutaneous Q12H  . insulin aspart  0-9 Units Subcutaneous TID WC  . mouth rinse  15 mL Mouth Rinse BID  . metoprolol tartrate  12.5 mg Oral BID  .  pantoprazole  40 mg Oral Daily  . rosuvastatin  20 mg Oral q1800  . sodium chloride flush  3 mL Intravenous Q12H  . vitamin C  500 mg Oral Daily  . zinc sulfate  220 mg Oral Daily   Continuous Infusions: . remdesivir 100 mg in NS 100 mL 100 mg (03/01/19 1209)   PRN Meds:.acetaminophen, ALPRAZolam, HYDROcodone-acetaminophen, influenza vac split quadrivalent PF, labetalol, lip balm,  phenol, sodium chloride   Time Spent in minutes 45  The patient is critically ill with multiple organ system failure and requires high complexity decision making for assessment and support, frequent evaluation and titration of therapies, advanced monitoring, review of radiographic studies and interpretation of complex data.   See all Orders from today for further details   Jeoffrey Massed M.D on 03/01/2019 at 12:27 PM  To page go to [email or web address] - use universal password  [facility] -  Office  [phone]    Objective:   Vitals:   03/01/19 0604 03/01/19 0700 03/01/19 0800 03/01/19 1146  BP:  117/74 101/86 121/84  Pulse:  83 86 100  Resp:  (!) 23 (!) 28 (!) 22  Temp:    (!) 97.5 F (36.4 C)  TempSrc:    Axillary  SpO2: 90% (!) 89% (!) 87% 94%  Weight:      Height:        Wt Readings from Last 3 Encounters:  02/24/19 85 kg  03/22/2019 86.2 kg     Intake/Output Summary (Last 24 hours) at 03/01/2019 1227 Last data filed at 03/01/2019 1022 Gross per 24 hour  Intake 692.6 ml  Output 1000 ml  Net -307.4 ml     Physical Exam Gen Exam:Alert awake-not in any distress HEENT:atraumatic, normocephalic Chest: B/L clear to auscultation anteriorly CVS:S1S2 regular Abdomen:soft non tender, non distended Extremities:no edema Neurology: Non focal Skin: no rash   Data Review:    CBC Recent Labs  Lab 02/23/19 0307 02/24/19 0219 02/25/19 0258 02/26/19 0355 02/27/19 0315 02/28/19 0355 03/01/19 0328  WBC 7.5 9.0 6.1 8.5 9.1 12.7* 14.3*  HGB 14.8 16.0 16.2 17.8* 18.3* 19.6* 18.9*  HCT 42.4 46.0 46.6 52.3* 53.9* 57.0* 55.7*  PLT 209 194 149* 178 194 180 171  MCV 84.6 84.2 85.5 86.9 86.5 86.8 88.1  MCH 29.5 29.3 29.7 29.6 29.4 29.8 29.9  MCHC 34.9 34.8 34.8 34.0 34.0 34.4 33.9  RDW 12.5 12.8 12.6 12.7 12.6 13.0 12.8  LYMPHSABS 0.7 0.7 0.6* 0.7 0.6*  --   --   MONOABS 0.2 0.2 0.1 0.1 0.2  --   --   EOSABS 0.0 0.0 0.0 0.0 0.0  --   --   BASOSABS 0.0 0.0 0.0  0.0 0.0  --   --     Chemistries  Recent Labs  Lab 02/24/19 0219 02/25/19 0258 02/26/19 0355 02/27/19 0315 02/28/19 0355 03/01/19 0328  NA 129* 130* 128* 133* 135 136  K 4.0 3.9 4.1 3.8 4.2 4.1  CL 85* 87* 82* 83* 91* 90*  CO2 32 28 29  GLUCOSE 122* 135* 113* 114* 125* 106*  BUN 27* 30* 35* 41* 49* 46*  CREATININE 0.90 0.93 0.85 0.87 0.94 0.94  CALCIUM 8.4* 8.2* 7.9* 8.6* 8.7* 8.7*  MG 2.2 2.5* 2.5* 2.5* 2.6*  --   AST 55* 46* 47* 40 38 39  ALT 51* 37 36 34 28 27  ALKPHOS 154* 175* 215* 239* 233* 213*  BILITOT 0.6 0.6 0.7 1.0 0.8 1.2   ------------------------------------------------------------------------------------------------------------------  No results for input(s): CHOL, HDL, LDLCALC, TRIG, CHOLHDL, LDLDIRECT in the last 72 hours.  Lab Results  Component Value Date   HGBA1C 6.8 (H) 02/27/2019   ------------------------------------------------------------------------------------------------------------------ No results for input(s): TSH, T4TOTAL, T3FREE, THYROIDAB in the last 72 hours.  Invalid input(s): FREET3 ------------------------------------------------------------------------------------------------------------------ Recent Labs    02/28/19 0355 03/01/19 0328  FERRITIN 1,371* 1,244*    Coagulation profile No results for input(s): INR, PROTIME in the last 168 hours.  Recent Labs    02/28/19 0355 03/01/19 0328  DDIMER 7.92* 5.60*    Cardiac Enzymes No results for input(s): CKMB, TROPONINI, MYOGLOBIN in the last 168 hours.  Invalid input(s): CK ------------------------------------------------------------------------------------------------------------------    Component Value Date/Time   BNP 53.0 2019/03/10 1234    Micro Results Recent Results (from the past 240 hour(s))  Culture, sputum-assessment     Status: None   Collection Time: 02/23/19  5:00 AM   Specimen: Sputum  Result Value Ref Range Status   Specimen Description  SPUTUM  Final   Special Requests NONE  Final   Sputum evaluation   Final    THIS SPECIMEN IS ACCEPTABLE FOR SPUTUM CULTURE Performed at [facility], [address], [place], [place] [address]    Report Status 02/23/2019 FINAL  Final  Culture, respiratory     Status: None   Collection Time: 02/23/19  5:00 AM   Specimen: SPU  Result Value Ref Range Status   Specimen Description   Final    SPUTUM Performed at [facility], [address], [place], [place] [address]    Special Requests   Final    NONE Reflexed from [id number] Performed at [facility], [address] [place], [place] [address]    Gram Stain   Final    RARE WBC PRESENT, PREDOMINANTLY PMN FEW GRAM POSITIVE COCCI IN CLUSTERS FEW GRAM NEGATIVE RODS FEW GRAM POSITIVE RODS    Culture   Final    FEW Consistent with normal respiratory flora. Performed at [facility], [address], [place], [place] [address]    Report Status 02/25/2019 FINAL  Final  MRSA PCR Screening     Status: None   Collection Time: 02/28/19  4:31 AM   Specimen: Nasal Mucosa; Nasopharyngeal  Result Value Ref Range Status   MRSA by PCR NEGATIVE NEGATIVE Final    Comment:        The GeneXpert MRSA Assay (FDA approved for NASAL specimens only), is one component of a comprehensive MRSA colonization surveillance program. It is not intended to diagnose MRSA infection nor to guide or monitor treatment for MRSA infections. Performed at [facility], [address], [place], [place] [address]     Radiology Reports CT CHEST W CONTRAST  Result Date: 02/28/2019 CLINICAL DATA:  Cardiac thrombus or embolic source suspected. Concern for thrombus within the proximal IVC seen on ECHO. EXAM: CT CHEST, ABDOMEN, AND PELVIS WITH CONTRAST TECHNIQUE: Multidetector CT imaging of the chest, abdomen and pelvis was performed following the standard protocol during bolus administration  of intravenous contrast. CONTRAST:  OMNIPAQUE IOHEXOL 350 MG/ML SOLN COMPARISON:  None. FINDINGS: CT CHEST FINDINGS Cardiovascular: There is no large centrally located pulmonary embolism. The heart size is normal. There is no significant pericardial effusion. Coronary artery calcifications are noted. Minimal atherosclerotic changes are noted of the thoracic aorta without evidence for a thoracic aortic aneurysm. Mediastinum/Nodes: --there are prominent mediastinal and hilar lymph nodes, likely reactive. --No axillary lymphadenopathy. --No  supraclavicular lymphadenopathy. --Normal thyroid gland. --The esophagus is unremarkable Lungs/Pleura: There are diffuse bilateral ground-glass airspace opacities involving all lobes to relatively equal degree. There is no pneumothorax. No significant pleural effusion. Musculoskeletal: No chest wall abnormality. No acute or significant osseous findings. Review of the MIP images confirms the above findings. CT ABDOMEN PELVIS FINDINGS Hepatobiliary: The liver is normal. Normal gallbladder.There is no biliary ductal dilation. Pancreas: Normal contours without ductal dilatation. No peripancreatic fluid collection. Spleen: No splenic laceration or hematoma. Adrenals/Urinary Tract: --Adrenal glands: No adrenal hemorrhage. --Right kidney/ureter: No hydronephrosis or perinephric hematoma. --Left kidney/ureter: No hydronephrosis or perinephric hematoma. --Urinary bladder: Unremarkable. Stomach/Bowel: --Stomach/Duodenum: No hiatal hernia or other gastric abnormality. Normal duodenal course and caliber. --Small bowel: No dilatation or inflammation. --Colon: No focal abnormality. --Appendix: Normal. Vascular/Lymphatic: Normal course and caliber of the major abdominal vessels. There is no evidence for a thrombus within the IVC. --No retroperitoneal lymphadenopathy. --No mesenteric lymphadenopathy. --No pelvic or inguinal lymphadenopathy. Reproductive: Unremarkable Other: No ascites or free  air. 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 Musculoskeletal. No acute displaced fractures. IMPRESSION: 1. No CT evidence for IVC thrombus. 2. Diffuse bilateral ground-glass airspace opacities consistent with the patient's history of viral pneumonia. 3. Prominent mediastinal and hilar lymph nodes, likely reactive. Aortic Atherosclerosis (ICD10-I70.0). Electronically Signed   By: Katherine Mantle M.D.   On: 02/28/2019 00:45   DG Chest Port 1 View  Result Date: 02/25/2019 CLINICAL DATA:  63 year old male COVID-19 pneumonia. EXAM: PORTABLE CHEST 1 VIEW COMPARISON:  03/12/19 and earlier. FINDINGS: Portable AP semi upright view at 0648 hours. Lower lung volumes. Stable cardiac size and mediastinal contours. Visualized tracheal air column is within normal limits. Coarse and interstitial bilateral pulmonary opacity. Some improved ventilation at the right lung base. Most confluent opacity now remains at the left lung base. No superimposed pneumothorax or effusion. Paucity of bowel gas in the upper abdomen. No acute osseous abnormality identified. IMPRESSION: 1. Continued bilateral pneumonia with improved ventilation at the right lung base since 03-12-19. 2. No new cardiopulmonary abnormality. Electronically Signed   By: Odessa Fleming M.D.   On: 02/25/2019 07:57   DG Chest Portable 1 View  Result Date: March 12, 2019 CLINICAL DATA:  Shortness of breath, COVID positive EXAM: PORTABLE CHEST 1 VIEW COMPARISON:  None. FINDINGS: Cardiomediastinal contours are normal. There is diffuse interstitial and airspace opacity throughout both the right and left chest. No signs of dense consolidation or evidence of pleural effusion. No acute bone finding. IMPRESSION: Diffuse interstitial and airspace opacity throughout both lungs. Findings which could be seen in viral or atypical pneumonia, also with COVID-19 infection. Asymmetric pulmonary edema could have similar appearance. Electronically Signed   By: Donzetta Kohut M.D.    On: March 12, 2019 13:01   ECHOCARDIOGRAM COMPLETE  Result Date: 02/27/2019   ECHOCARDIOGRAM REPORT   Patient Name:   DEQUAVION FOLLETTE Date of Exam: 02/27/2019 Medical Rec #:  [id number]    Height:       65.5 in Accession #:    [id number]   Weight:       187.4 lb Date of Birth:  28-Jul-1955   BSA:          1.94 m Patient Age:    63 years     BP:           109/71 mmHg Patient Gender: M            HR:           102 bpm. Exam Location:  Inpatient Procedure: 2D Echo, Cardiac Doppler and Color Doppler Indications:    Acute respiratory failure.  History:        Patient has no prior history of Echocardiogram examinations.                 Abnormal ECG. Covid 19 positive. Hypoxia. Pneumonia.  Sonographer:    Sheralyn Boatman RDCS Referring Phys: [id number] [facility]  Sonographer Comments: Technically difficult study due to poor echo windows. IMPRESSIONS  1. There is a mass like structure present in the IVC. This prevents collpase of the IVC during the examination. Due to the patient's history of covid-19 infection and LE DVT, this likely represents a thrombus. RV size and function are normal.  2. Left ventricular ejection fraction, by visual estimation, is >75%. The left ventricle has hyperdynamic function. There is mildly increased left ventricular hypertrophy.  3. Small left ventricular internal cavity size.  4. The left ventricle has no regional wall motion abnormalities.  5. Global right ventricle has normal systolic function.The right ventricular size is normal. No increase in right ventricular wall thickness.  6. Left atrial size was normal.  7. Right atrial size was normal.  8. Presence of pericardial fat pad.  9. The pericardial effusion is circumferential. 10. Trivial pericardial effusion is present. 11. The mitral valve is grossly normal. No evidence of mitral valve regurgitation. 12. The tricuspid valve is grossly normal. Tricuspid valve regurgitation is trivial. 13. The aortic valve is grossly normal. Aortic valve  regurgitation is not visualized. No evidence of aortic valve sclerosis or stenosis. 14. The pulmonic valve was grossly normal. Pulmonic valve regurgitation is not visualized. 15. TR signal is inadequate for assessing pulmonary artery systolic pressure. 16. 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 17. The inferior vena cava is normal in size with <50% respiratory variability, suggesting right atrial pressure of 8 mmHg. FINDINGS  Left Ventricle: Left ventricular ejection fraction, by visual estimation, is >75%. The left ventricle has hyperdynamic function. The left ventricle has no regional wall motion abnormalities. The left ventricular internal cavity size was the LV cavity size is small. There is mildly increased left ventricular hypertrophy. Concentric left ventricular hypertrophy. Left ventricular diastolic parameters are consistent with age-related delayed relaxation (normal). Right Ventricle: The right ventricular size is normal. No increase in right ventricular wall thickness. Global RV systolic function is has normal systolic function. Left Atrium: Left atrial size was normal in size. Right Atrium: Right atrial size was normal in size Pericardium: Trivial pericardial effusion is present. The pericardial effusion is circumferential. Presence of pericardial fat pad. Mitral Valve: The mitral valve is grossly normal. No evidence of mitral valve regurgitation. Tricuspid Valve: The tricuspid valve is grossly normal. Tricuspid valve regurgitation is trivial. Aortic Valve: The aortic valve is grossly normal. Aortic valve regurgitation is not visualized. The aortic valve is structurally normal, with no evidence of sclerosis or stenosis. Pulmonic Valve: The pulmonic valve was grossly normal. Pulmonic valve regurgitation is not visualized. Pulmonic regurgitation is not visualized. Aorta: The aortic root is normal in size and structure. Venous: The inferior vena cava is normal in size with less than 50% respiratory  variability, suggesting right atrial pressure of 8 mmHg. There is mass detected in the inferior vena cava. IAS/Shunts: No atrial level shunt detected by color flow Doppler.  LEFT VENTRICLE PLAX 2D LVIDd:         2.67 cm       Diastology LVIDs:  1.94 cm       LV e' lateral:   7.29 cm/s LV PW:         1.58 cm       LV E/e' lateral: 7.6 LV IVS:        1.20 cm       LV e' medial:    5.22 cm/s LVOT diam:     1.60 cm       LV E/e' medial:  10.6 LV SV:         15 ml LV SV Index:   7.22 LVOT Area:     2.01 cm  LV Volumes (MOD) LV area d, A2C:    12.70 cm LV area d, A4C:    17.80 cm LV area s, A2C:    8.18 cm LV area s, A4C:    9.86 cm LV major d, A2C:   4.92 cm LV major d, A4C:   6.84 cm LV major s, A2C:   4.51 cm LV major s, A4C:   5.77 cm LV vol d, MOD A2C: 27.7 ml LV vol d, MOD A4C: 40.2 ml LV vol s, MOD A2C: 12.9 ml LV vol s, MOD A4C: 14.3 ml LV SV MOD A2C:     14.8 ml LV SV MOD A4C:     40.2 ml LV SV MOD BP:      24.0 ml RIGHT VENTRICLE             IVC RV S prime:     22.40 cm/s  IVC diam: 1.53 cm TAPSE (M-mode): 1.7 cm LEFT ATRIUM             Index       RIGHT ATRIUM           Index LA diam:        2.40 cm 1.24 cm/m  RA Area:     13.30 cm LA Vol (A2C):   22.5 ml 11.63 ml/m RA Volume:   34.80 ml  17.98 ml/m LA Vol (A4C):   27.4 ml 14.16 ml/m LA Biplane Vol: 26.4 ml 13.64 ml/m  AORTIC VALVE LVOT Vmax:   133.00 cm/s LVOT Vmean:  105.000 cm/s LVOT VTI:    0.211 m  AORTA Ao Root diam: 2.90 cm MITRAL VALVE MV Area (PHT): 2.39 cm             SHUNTS MV PHT:        91.93 msec           Systemic VTI:  0.21 m MV Decel Time: 317 msec             Systemic Diam: 1.60 cm MV E velocity: 55.30 cm/s 103 cm/s MV A velocity: 89.20 cm/s 70.3 cm/s MV E/A ratio:  0.62       1.5  Lennie Odor MD Electronically signed by Lennie Odor MD Signature Date/Time: 02/27/2019/5:55:17 PM    Final    CT VENOGRAM ABD/PEL  Result Date: 02/28/2019 CLINICAL DATA:  Cardiac thrombus or embolic source suspected. Concern for thrombus  within the proximal IVC seen on ECHO. EXAM: CT CHEST, ABDOMEN, AND PELVIS WITH CONTRAST TECHNIQUE: Multidetector CT imaging of the chest, abdomen and pelvis was performed following the standard protocol during bolus administration of intravenous contrast. CONTRAST:  OMNIPAQUE IOHEXOL 350 MG/ML SOLN COMPARISON:  None. FINDINGS: CT CHEST FINDINGS Cardiovascular: There is no large centrally located pulmonary embolism. The heart size is normal. There is no significant pericardial effusion. Coronary artery calcifications are noted.  Minimal atherosclerotic changes are noted of the thoracic aorta without evidence for a thoracic aortic aneurysm. Mediastinum/Nodes: --there are prominent mediastinal and hilar lymph nodes, likely reactive. --No axillary lymphadenopathy. --No supraclavicular lymphadenopathy. --Normal thyroid gland. --The esophagus is unremarkable Lungs/Pleura: There are diffuse bilateral ground-glass airspace opacities involving all lobes to relatively equal degree. There is no pneumothorax. No significant pleural effusion. Musculoskeletal: No chest wall abnormality. No acute or significant osseous findings. Review of the MIP images confirms the above findings. CT ABDOMEN PELVIS FINDINGS Hepatobiliary: The liver is normal. Normal gallbladder.There is no biliary ductal dilation. Pancreas: Normal contours without ductal dilatation. No peripancreatic fluid collection. Spleen: No splenic laceration or hematoma. Adrenals/Urinary Tract: --Adrenal glands: No adrenal hemorrhage. --Right kidney/ureter: No hydronephrosis or perinephric hematoma. --Left kidney/ureter: No hydronephrosis or perinephric hematoma. --Urinary bladder: Unremarkable. Stomach/Bowel: --Stomach/Duodenum: No hiatal hernia or other gastric abnormality. Normal duodenal course and caliber. --Small bowel: No dilatation or inflammation. --Colon: No focal abnormality. --Appendix: Normal. Vascular/Lymphatic: Normal course and caliber of the major  abdominal vessels. There is no evidence for a thrombus within the IVC. --No retroperitoneal lymphadenopathy. --No mesenteric lymphadenopathy. --No pelvic or inguinal lymphadenopathy. Reproductive: Unremarkable Other: No ascites or free air. 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 Musculoskeletal. No acute displaced fractures. IMPRESSION: 1. No CT evidence for IVC thrombus. 2. Diffuse bilateral ground-glass airspace opacities consistent with the patient's history of viral pneumonia. 3. Prominent mediastinal and hilar lymph nodes, likely reactive. Aortic Atherosclerosis (ICD10-I70.0). Electronically Signed   By: Constance Holster M.D.   On: 02/28/2019 00:45   VAS [place] LOWER EXTREMITY VENOUS (DVT)  Result Date: 02/24/2019  Lower Venous Study Indications: Elevated Ddimer.  Risk Factors: COVID 19 positive. Comparison Study: No prior studies. Performing Technologist: Oliver Hum RVT  Examination Guidelines: A complete evaluation includes B-mode imaging, spectral Doppler, color Doppler, and power Doppler as needed of all accessible portions of each vessel. Bilateral testing is considered an integral part of a complete examination. Limited examinations for reoccurring indications may be performed as noted.  +---------+---------------+---------+-----------+----------+--------------+ RIGHT    CompressibilityPhasicitySpontaneityPropertiesThrombus Aging +---------+---------------+---------+-----------+----------+--------------+ CFV      Full           Yes      Yes                                 +---------+---------------+---------+-----------+----------+--------------+ SFJ      Full                                                        +---------+---------------+---------+-----------+----------+--------------+ FV Prox  Full                                                        +---------+---------------+---------+-----------+----------+--------------+ FV Mid   Full                                                         +---------+---------------+---------+-----------+----------+--------------+ FV DistalFull                                                        +---------+---------------+---------+-----------+----------+--------------+  PFV      Full                                                        +---------+---------------+---------+-----------+----------+--------------+ POP      Full           Yes      Yes                                 +---------+---------------+---------+-----------+----------+--------------+ PTV      Partial                                      Acute          +---------+---------------+---------+-----------+----------+--------------+ PERO     Full                                                        +---------+---------------+---------+-----------+----------+--------------+   +---------+---------------+---------+-----------+----------+--------------+ LEFT     CompressibilityPhasicitySpontaneityPropertiesThrombus Aging +---------+---------------+---------+-----------+----------+--------------+ CFV      Full           Yes      Yes                                 +---------+---------------+---------+-----------+----------+--------------+ SFJ      Full                                                        +---------+---------------+---------+-----------+----------+--------------+ FV Prox  Full                                                        +---------+---------------+---------+-----------+----------+--------------+ FV Mid   Full                                                        +---------+---------------+---------+-----------+----------+--------------+ FV DistalFull                                                        +---------+---------------+---------+-----------+----------+--------------+ PFV      Full                                                         +---------+---------------+---------+-----------+----------+--------------+  POP      Full           Yes      Yes                                 +---------+---------------+---------+-----------+----------+--------------+ PTV      Full                                                        +---------+---------------+---------+-----------+----------+--------------+ PERO     Full                                                        +---------+---------------+---------+-----------+----------+--------------+     Summary: Right: Findings consistent with acute deep vein thrombosis involving the right posterior tibial veins. No cystic structure found in the popliteal fossa. Left: There is no evidence of deep vein thrombosis in the lower extremity. No cystic structure found in the popliteal fossa.  *See table(s) above for measurements and observations. Electronically signed by Waverly Ferrari MD on 02/24/2019 at 3:55:02 PM.    Final

## 2019-03-01 NOTE — Evaluation (Signed)
Physical Therapy Evaluation Patient Details Name: Alexander Erickson MRN: [id number] DOB: 13-Dec-1955 Today's Date: 03/01/2019   History of Present Illness  Pt is a 63 y.o. male admitted 03/21/2019 with progressive cough and dyspnea, found to be hypoxic with COVID-19 (initial positive 11/30). Pt with RLE DVT, echo suggested IVC clot, CT venogram without clots; pt not stable for CTA to r/o PE. PMH includes HTN, HLD.    Clinical Impression  Pt presents with an overall decrease in functional mobility secondary to above. PTA, pt independent and lives with son, retired from work. Today, pt requiring minA for standing activity and transfer to recliner; pt limited by decreased activity tolerance and current respiratory needs, requiring frequent rest breaks due to DOE with minimal exertion.  Pt would benefit from continued acute PT services to maximize functional mobility and independence prior to d/c with HHPT services.   SpO2 86-94% on 35L HHFNC at 100% FiO2 + NRB    Follow Up Recommendations Home health PT;Supervision for mobility/OOB    Equipment Recommendations  (TBD)    Recommendations for Other Services OT consult     Precautions / Restrictions Precautions Precautions: Fall Precaution Comments: HHFNC 35L 100% FiO2 + NRB Restrictions Weight Bearing Restrictions: No      Mobility  Bed Mobility Overal bed mobility: Modified Independent             General bed mobility comments: Increased time and effort, bed flat; reliant on seated rest at EOB due to SOB  Transfers Overall transfer level: Needs assistance Equipment used: 1 person hand held assist Transfers: Sit to/from Stand Sit to Stand: Min assist         General transfer comment: MinA for HHA to stand and maintain balance, cues to stand fully upright  Ambulation/Gait Ambulation/Gait assistance: Min assist Gait Distance (Feet): 2 Feet Assistive device: 1 person hand held assist Gait Pattern/deviations: Step-to pattern      General Gait Details: Amb a few steps towards [facility] then to recliner with minA for HHA to maintain balance; limited by SOB on HHFNC+NRB  Stairs            Wheelchair Mobility    Modified Rankin (Stroke Patients Only)       Balance Overall balance assessment: Needs assistance   Sitting balance-Leahy Scale: Good       Standing balance-Leahy Scale: Poor Standing balance comment: Reliant on HHA to maintain balance; able to static stand ~30sec before needing seated rest                             Pertinent Vitals/Pain Pain Assessment: Faces Faces Pain Scale: Hurts a little bit Pain Location: Buttocks from laying in bed Pain Descriptors / Indicators: Numbness Pain Intervention(s): Repositioned    Home Living Family/patient expects to be discharged to:: Private residence Living Arrangements: Children;Other relatives Available Help at Discharge: Family;Available 24 hours/day Type of Home: House Home Access: Stairs to enter Entrance Stairs-Rails: Right Entrance Stairs-Number of Steps: 2-3 Home Layout: One level   Additional Comments: "I live with my son and my power of attorney"    Prior Function Level of Independence: Independent         Comments: Retired from work, independent. Difficult obtaining details as pt very SOB with NRB on     Hand Dominance        Extremity/Trunk Assessment   Upper Extremity Assessment Upper Extremity Assessment: Generalized weakness    Lower Extremity Assessment Lower  Extremity Assessment: Generalized weakness       Communication      Cognition Arousal/Alertness: Awake/alert Behavior During Therapy: WFL for tasks assessed/performed Overall Cognitive Status: Within Functional Limits for tasks assessed                                 General Comments: WFL for simple tasks, not formally assessed      General Comments General comments (skin integrity, edema, etc.): SpO2 86-94% on HHFNC 35L  at 100% FiO2 + NRB    Exercises Other Exercises Other Exercises: Educ on LAQ, seated marching   Assessment/Plan    PT Assessment Patient needs continued PT services  PT Problem List Decreased strength;Decreased activity tolerance;Decreased balance;Decreased mobility;Decreased knowledge of use of DME;Cardiopulmonary status limiting activity       PT Treatment Interventions DME instruction;Gait training;Stair training;Functional mobility training;Therapeutic activities;Therapeutic exercise;Balance training;Patient/family education    PT Goals (Current goals can be found in the Care Plan section)  Acute Rehab PT Goals Patient Stated Goal: Return home PT Goal Formulation: With patient Time For Goal Achievement: 03/15/19 Potential to Achieve Goals: Good    Frequency Min 3X/week   Barriers to discharge        Co-evaluation               AM-PAC PT "6 Clicks" Mobility  Outcome Measure Help needed turning from your back to your side while in a flat bed without using bedrails?: None Help needed moving from lying on your back to sitting on the side of a flat bed without using bedrails?: None Help needed moving to and from a bed to a chair (including a wheelchair)?: A Little Help needed standing up from a chair using your arms (e.g., wheelchair or bedside chair)?: A Little Help needed to walk in hospital room?: A Little Help needed climbing 3-5 steps with a railing? : A Lot 6 Click Score: 19    End of Session Equipment Utilized During Treatment: Oxygen Activity Tolerance: Patient tolerated treatment well;Treatment limited secondary to medical complications (Comment) Patient left: in chair;with call bell/phone within reach Nurse Communication: Mobility status PT Visit Diagnosis: Other abnormalities of gait and mobility (R26.89)    Time: 8502-7741 PT Time Calculation (min) (ACUTE ONLY): 24 min   Charges:   PT Evaluation $PT Eval Moderate Complexity: 1 Mod PT  Treatments $Therapeutic Activity: 8-22 mins      Mabeline Caras, PT, DPT Acute Rehabilitation Services  Pager [phone] Office [place] 03/01/2019, 3:41 PM

## 2019-03-02 ENCOUNTER — Inpatient Hospital Stay (HOSPITAL_COMMUNITY): Payer: Medicaid Other

## 2019-03-02 LAB — COMPREHENSIVE METABOLIC PANEL
ALT: 23 U/L (ref 0–44)
AST: 34 U/L (ref 15–41)
Albumin: 2.5 g/dL — ABNORMAL LOW (ref 3.5–5.0)
Alkaline Phosphatase: 180 U/L — ABNORMAL HIGH (ref 38–126)
Anion gap: 13 (ref 5–15)
BUN: 40 mg/dL — ABNORMAL HIGH (ref 8–23)
CO2: 29 mmol/L (ref 22–32)
Calcium: 8.4 mg/dL — ABNORMAL LOW (ref 8.9–10.3)
Chloride: 91 mmol/L — ABNORMAL LOW (ref 98–111)
Creatinine, Ser: 0.93 mg/dL (ref 0.61–1.24)
GFR calc Af Amer: >60 mL/min (ref >60)
GFR calc non Af Amer: >60 mL/min (ref >60)
Glucose, Bld: 110 mg/dL — ABNORMAL HIGH (ref 70–99)
Potassium: 4.4 mmol/L (ref 3.5–5.1)
Sodium: 133 mmol/L — ABNORMAL LOW (ref 135–145)
Total Bilirubin: 1.2 mg/dL (ref 0.3–1.2)
Total Protein: 6.4 g/dL — ABNORMAL LOW (ref 6.5–8.1)

## 2019-03-02 LAB — C-REACTIVE PROTEIN: CRP: 0.7 mg/dL (ref <1.0)

## 2019-03-02 LAB — CBC
HCT: 53.2 % — ABNORMAL HIGH (ref 39.0–52.0)
Hemoglobin: 17.9 g/dL — ABNORMAL HIGH (ref 13.0–17.0)
MCH: 29.8 pg (ref 26.0–34.0)
MCHC: 33.6 g/dL (ref 30.0–36.0)
MCV: 88.5 fL (ref 80.0–100.0)
Platelets: 153 10*3/uL (ref 150–400)
RBC: 6.01 MIL/uL — ABNORMAL HIGH (ref 4.22–5.81)
RDW: 12.8 % (ref 11.5–15.5)
WBC: 15.6 10*3/uL — ABNORMAL HIGH (ref 4.0–10.5)
nRBC: 0.3 % — ABNORMAL HIGH (ref 0.0–0.2)

## 2019-03-02 LAB — FERRITIN: Ferritin: 1047 ng/mL — ABNORMAL HIGH (ref 24–336)

## 2019-03-02 LAB — GLUCOSE, CAPILLARY
Glucose-Capillary: 103 mg/dL — ABNORMAL HIGH (ref 70–99)
Glucose-Capillary: 239 mg/dL — ABNORMAL HIGH (ref 70–99)
Glucose-Capillary: 312 mg/dL — ABNORMAL HIGH (ref 70–99)
Glucose-Capillary: 164 mg/dL — ABNORMAL HIGH (ref 70–99)

## 2019-03-02 LAB — D-DIMER, QUANTITATIVE: D-Dimer, Quant: 3.66 ug/mL-FEU — ABNORMAL HIGH (ref 0.00–0.50)

## 2019-03-02 LAB — MAGNESIUM: Magnesium: 2.3 mg/dL (ref 1.7–2.4)

## 2019-03-02 MED ORDER — FUROSEMIDE 10 MG/ML IJ SOLN
40.0000 mg | Freq: Every day | INTRAMUSCULAR | Status: DC
  Administered 2019-03-03 – 2019-03-08 (×6): 40 mg via INTRAVENOUS
  Filled 2019-03-02 (×8): qty 4

## 2019-03-02 MED ORDER — FUROSEMIDE 10 MG/ML IJ SOLN
40.0000 mg | Freq: Every day | INTRAMUSCULAR | Status: DC
  Administered 2019-03-02: 40 mg via INTRAVENOUS

## 2019-03-02 NOTE — Progress Notes (Addendum)
PROGRESS NOTE                                                                                                                                                                                                             Patient Demographics:    Alexander Erickson, is a 63 y.o. male, DOB - 1956/02/19, ZOX:[id number]  Outpatient Primary MD for the patient is Patient, No Pcp Per   Admit date - 03-23-2019   LOS - 8  No chief complaint on file.      Brief Narrative: Patient is a 63 y.o. male with PMHx of HTN, HLD-who presented with 1 week history of progressive cough and shortness of breath-he was found to have acute hypoxemic respiratory failure in the setting of COVID-19 pneumonia along with severe hyponatremia.   Subjective:   Appears comfortable-not in any distress-denies any chest pain-remains on heated high flow and 100% NRB.   Assessment  & Plan :   Acute Hypoxic Resp Failure due to Covid 19 Viral pneumonia and possible concurrent bacterial pneumonia: Remains unchanged over the past several days-he is stable but still very tenuous-remains on both heated high flow and 100% NRB.  Continue steroids and remdesivir.  Chest x-ray on 12/12 reviewed personally-still with significant amount of infiltrates.  Fever: afebrile  O2 requirements:  SpO2: 92 % O2 Flow Rate (L/min): 15 L/min + 100% O2 via NRB FiO2 (%): 100 %   COVID-19 Labs: Recent Labs    02/28/19 0355 03/01/19 0328 03/02/19 0316  DDIMER 7.92* 5.60* 3.66*  FERRITIN 1,371* 1,244* 1,047*  CRP 1.0* 0.8 0.7       Component Value Date/Time   BNP 53.0 March 23, 2019 1234    Recent Labs  Lab 02/24/19 0219  PROCALCITON 0.37    Lab Results  Component Value Date   SARSCOV2NAA Detected (A) 02/18/2019     COVID-19 Medications: Decadron 12/4 > Remdesivir 12/4 >  Actemra 12/6  Other medications: Diuretics:Euvolemic-continue Lasix to maintain negative  balance Antibiotics: Completed Rocephin x5 days on 12/8-given elevated procalcitonin level Insulin: CBG stable on SSI.  A1c 6.8-May need initiation of metformin on discharge.   Prone/Incentive Spirometry: encouraged patient to lie prone for 3-4 hours at a time for a total of 16 hours a day, and to encourage incentive spirometry use 3-4/hour  DVT Prophylaxis  :  Lovenox -therapeutic dosing  Right lower extremity DVT with possible IVC clot seen on echo: Continue full dose Lovenox.  CT venogram of the chest/abdomen and pelvis did not show any IVC clot, had discussed case with [facility]-Dr. Towanda Malkin at this time recommends continuing anticoagulation without the need for thrombolytics or any other vascular procedures.  .  Hyponatremia: Improving-continue cautious diuresis.  Dyslipidemia: Continue statin  Prolonged QTC: Improved-continue to follow periodically.  Avoid QTC prolonging agents, follow potassium and magnesium  Obesity: Estimated body mass index is 30.71 kg/m as calculated from the following:   Height as of this encounter: 5' 5.5" (1.664 m).   Weight as of this encounter: 85 kg.    GI prophylaxis: PPI  Consults  :  None  Procedures  :  None  ABG: No results found for: PHART, PCO2ART, PO2ART, HCO3, TCO2, ACIDBASEDEF, O2SAT  Vent Settings: N/a  Condition - Extremely Guarded-very tenuous with risk for further deterioration  Family Communication  :  Son updated over the phone 12/12  Code Status :  Full Code  Diet :  Diet Order            Diet Carb Modified Fluid consistency: Thin; Room service appropriate? Yes  Diet effective now               Disposition Plan  :  Remain hospitalized-transfer to ICU for close monitoring-and for heated high flow.  Barriers to discharge: Hypoxia requiring O2 supplementation  Antimicorbials  :    Anti-infectives (From admission, onward)   Start     Dose/Rate Route Frequency Ordered Stop   02/27/19 1500  remdesivir 100 mg in sodium  chloride 0.9 % 100 mL IVPB    Note to Pharmacy: To complete a total of 10 days of therapy   100 mg 200 mL/hr over 30 Minutes Intravenous Daily 02/27/19 1320 03/04/19 0959   02/23/19 1000  remdesivir 100 mg in sodium chloride 0.9 % 100 mL IVPB  Status:  Discontinued     100 mg 200 mL/hr over 30 Minutes Intravenous Daily 2019/03/03 2057 2019-03-03 2102   02/23/19 1000  remdesivir 100 mg in sodium chloride 0.9 % 100 mL IVPB     100 mg 200 mL/hr over 30 Minutes Intravenous Daily 2019/03/03 2104 02/26/19 0915   2019/03/03 2315  doxycycline (VIBRA-TABS) tablet 100 mg  Status:  Discontinued     100 mg Oral Every 12 hours 03-Mar-2019 2306 02/23/19 1657   03/03/2019 2100  remdesivir 200 mg in sodium chloride 0.9% 250 mL IVPB  Status:  Discontinued     200 mg 580 mL/hr over 30 Minutes Intravenous Once 2019-03-03 2057 March 03, 2019 2102   2019/03/03 2100  cefTRIAXone (ROCEPHIN) 2 g in sodium chloride 0.9 % 100 mL IVPB     2 g 200 mL/hr over 30 Minutes Intravenous Every 24 hours 2019-03-03 2058 02/26/19 2215   03-03-2019 2100  azithromycin (ZITHROMAX) tablet 500 mg  Status:  Discontinued     500 mg Oral Daily 03/03/2019 2058 2019-03-03 2306      Inpatient Medications  Scheduled Meds: . Chlorhexidine Gluconate Cloth  6 each Topical Daily  . cholecalciferol  1,000 Units Oral Daily  . dexamethasone (DECADRON) injection  6 mg Intravenous Q24H  . enoxaparin (LOVENOX) injection  1 mg/kg Subcutaneous Q12H  . insulin aspart  0-9 Units Subcutaneous TID WC  . mouth rinse  15 mL Mouth Rinse BID  . metoprolol tartrate  12.5 mg Oral BID  . pantoprazole  40 mg Oral Daily  . rosuvastatin  20 mg Oral q1800  . sodium chloride flush  3 mL Intravenous Q12H  . vitamin C  500 mg Oral Daily  . zinc sulfate  220 mg Oral Daily   Continuous Infusions: . remdesivir 100 mg in NS 100 mL 100 mg (03/02/19 1140)   PRN Meds:.acetaminophen, ALPRAZolam, HYDROcodone-acetaminophen, influenza vac split quadrivalent PF, labetalol, lip balm, phenol,  sodium chloride   Time Spent in minutes 45  The patient is critically ill with multiple organ system failure and requires high complexity decision making for assessment and support, frequent evaluation and titration of therapies, advanced monitoring, review of radiographic studies and interpretation of complex data.   See all Orders from today for further details   Jeoffrey Massed M.D on 03/02/2019 at 2:28 PM  To page go to [email or web address] - use universal password  [facility] -  Office  [phone]    Objective:   Vitals:   03/02/19 1136 03/02/19 1224 03/02/19 1300 03/02/19 1400  BP: 126/72 132/80    Pulse: (!) 108 (!) 108  95  Resp: (!) 22 (!) 24  (!) 28  Temp: (!) 97.3 F (36.3 C)     TempSrc: Axillary     SpO2: 93% 97% (!) 88% (!) 89%  Weight:      Height:        Wt Readings from Last 3 Encounters:  02/24/19 85 kg  20-Mar-2019 86.2 kg     Intake/Output Summary (Last 24 hours) at 03/02/2019 1428 Last data filed at 03/02/2019 1140 Gross per 24 hour  Intake --  Output 925 ml  Net -925 ml     Physical Exam Gen Exam:Alert awake-not in any distress HEENT:atraumatic, normocephalic Chest: B/L clear to auscultation anteriorly CVS:S1S2 regular Abdomen:soft non tender, non distended Extremities:no edema Neurology: Non focal Skin: no rash   Data Review:    CBC Recent Labs  Lab 02/24/19 0219 02/25/19 0258 02/26/19 0355 02/27/19 0315 02/28/19 0355 03/01/19 0328 03/02/19 0316  WBC 9.0 6.1 8.5 9.1 12.7* 14.3* 15.6*  HGB 16.0 16.2 17.8* 18.3* 19.6* 18.9* 17.9*  HCT 46.0 46.6 52.3* 53.9* 57.0* 55.7* 53.2*  PLT 194 149* 178 194 180 171 153  MCV 84.2 85.5 86.9 86.5 86.8 88.1 88.5  MCH 29.3 29.7 29.6 29.4 29.8 29.9 29.8  MCHC 34.8 34.8 34.0 34.0 34.4 33.9 33.6  RDW 12.8 12.6 12.7 12.6 13.0 12.8 12.8  LYMPHSABS 0.7 0.6* 0.7 0.6*  --   --   --   MONOABS 0.2 0.1 0.1 0.2  --   --   --   EOSABS 0.0 0.0 0.0 0.0  --   --   --   BASOSABS 0.0 0.0 0.0 0.0  --    --   --     Chemistries  Recent Labs  Lab 02/25/19 0258 02/26/19 0355 02/27/19 0315 02/28/19 0355 03/01/19 0328 03/02/19 0316  NA 130* 128* 133* 135 136 133*  K 3.9 4.1 3.8 4.2 4.1 4.4  CL 87* 82* 83* 91* 90* 91*  CO2 27 26 32 GLUCOSE 135* 113* 114* 125* 106* 110*  BUN 30* 35* 41* 49* 46* 40*  CREATININE 0.93 0.85 0.87 0.94 0.94 0.93  CALCIUM 8.2* 7.9* 8.6* 8.7* 8.7* 8.4*  MG 2.5* 2.5* 2.5* 2.6*  --  2.3  AST 46* 47* 40 38 39 34  ALT 37 36 34 ALKPHOS 175* 215* 239* 233* 213* 180*  BILITOT 0.6 0.7 1.0 0.8 1.2 1.2   ------------------------------------------------------------------------------------------------------------------ No  results for input(s): CHOL, HDL, LDLCALC, TRIG, CHOLHDL, LDLDIRECT in the last 72 hours.  Lab Results  Component Value Date   HGBA1C 6.8 (H) 02/27/2019   ------------------------------------------------------------------------------------------------------------------ No results for input(s): TSH, T4TOTAL, T3FREE, THYROIDAB in the last 72 hours.  Invalid input(s): FREET3 ------------------------------------------------------------------------------------------------------------------ Recent Labs    03/01/19 0328 03/02/19 0316  FERRITIN 1,244* 1,047*    Coagulation profile No results for input(s): INR, PROTIME in the last 168 hours.  Recent Labs    03/01/19 0328 03/02/19 0316  DDIMER 5.60* 3.66*    Cardiac Enzymes No results for input(s): CKMB, TROPONINI, MYOGLOBIN in the last 168 hours.  Invalid input(s): CK ------------------------------------------------------------------------------------------------------------------    Component Value Date/Time   BNP 53.0 02/28/2019 1234    Micro Results Recent Results (from the past 240 hour(s))  Culture, sputum-assessment     Status: None   Collection Time: 02/23/19  5:00 AM   Specimen: Sputum  Result Value Ref Range Status   Specimen Description SPUTUM  Final    Special Requests NONE  Final   Sputum evaluation   Final    THIS SPECIMEN IS ACCEPTABLE FOR SPUTUM CULTURE Performed at [facility], [address] [place], [place] [address]    Report Status 02/23/2019 FINAL  Final  Culture, respiratory     Status: None   Collection Time: 02/23/19  5:00 AM   Specimen: SPU  Result Value Ref Range Status   Specimen Description   Final    SPUTUM Performed at [facility], [address] [place], [place] [address]    Special Requests   Final    NONE Reflexed from [phone] Performed at [facility], [address], [place], [place] [address]    Gram Stain   Final    RARE WBC PRESENT, PREDOMINANTLY PMN FEW GRAM POSITIVE COCCI IN CLUSTERS FEW GRAM NEGATIVE RODS FEW GRAM POSITIVE RODS    Culture   Final    FEW Consistent with normal respiratory flora. Performed at [facility], [address] [place], [place] [address]    Report Status 02/25/2019 FINAL  Final  MRSA PCR Screening     Status: None   Collection Time: 02/28/19  4:31 AM   Specimen: Nasal Mucosa; Nasopharyngeal  Result Value Ref Range Status   MRSA by PCR NEGATIVE NEGATIVE Final    Comment:        The GeneXpert MRSA Assay (FDA approved for NASAL specimens only), is one component of a comprehensive MRSA colonization surveillance program. It is not intended to diagnose MRSA infection nor to guide or monitor treatment for MRSA infections. Performed at [facility], [address], [place], [place] [address]     Radiology Reports CT CHEST W CONTRAST  Result Date: 02/28/2019 CLINICAL DATA:  Cardiac thrombus or embolic source suspected. Concern for thrombus within the proximal IVC seen on ECHO. EXAM: CT CHEST, ABDOMEN, AND PELVIS WITH CONTRAST TECHNIQUE: Multidetector CT imaging of the chest, abdomen and pelvis was performed following the standard protocol during bolus administration of intravenous  contrast. CONTRAST:  OMNIPAQUE IOHEXOL 350 MG/ML SOLN COMPARISON:  None. FINDINGS: CT CHEST FINDINGS Cardiovascular: There is no large centrally located pulmonary embolism. The heart size is normal. There is no significant pericardial effusion. Coronary artery calcifications are noted. Minimal atherosclerotic changes are noted of the thoracic aorta without evidence for a thoracic aortic aneurysm. Mediastinum/Nodes: --there are prominent mediastinal and hilar lymph nodes, likely reactive. --No axillary lymphadenopathy. --No supraclavicular  lymphadenopathy. --Normal thyroid gland. --The esophagus is unremarkable Lungs/Pleura: There are diffuse bilateral ground-glass airspace opacities involving all lobes to relatively equal degree. There is no pneumothorax. No significant pleural effusion. Musculoskeletal: No chest wall abnormality. No acute or significant osseous findings. Review of the MIP images confirms the above findings. CT ABDOMEN PELVIS FINDINGS Hepatobiliary: The liver is normal. Normal gallbladder.There is no biliary ductal dilation. Pancreas: Normal contours without ductal dilatation. No peripancreatic fluid collection. Spleen: No splenic laceration or hematoma. Adrenals/Urinary Tract: --Adrenal glands: No adrenal hemorrhage. --Right kidney/ureter: No hydronephrosis or perinephric hematoma. --Left kidney/ureter: No hydronephrosis or perinephric hematoma. --Urinary bladder: Unremarkable. Stomach/Bowel: --Stomach/Duodenum: No hiatal hernia or other gastric abnormality. Normal duodenal course and caliber. --Small bowel: No dilatation or inflammation. --Colon: No focal abnormality. --Appendix: Normal. Vascular/Lymphatic: Normal course and caliber of the major abdominal vessels. There is no evidence for a thrombus within the IVC. --No retroperitoneal lymphadenopathy. --No mesenteric lymphadenopathy. --No pelvic or inguinal lymphadenopathy. Reproductive: Unremarkable Other: No ascites or free air. There are  bilateral fat containing inguinal hernias. Musculoskeletal. No acute displaced fractures. IMPRESSION: 1. No CT evidence for IVC thrombus. 2. Diffuse bilateral ground-glass airspace opacities consistent with the patient's history of viral pneumonia. 3. Prominent mediastinal and hilar lymph nodes, likely reactive. Aortic Atherosclerosis (ICD10-I70.0). Electronically Signed   By: Constance Holster M.D.   On: 02/28/2019 00:45   DG Chest Port 1 View  Result Date: 02/25/2019 CLINICAL DATA:  63 year old male COVID-19 pneumonia. EXAM: PORTABLE CHEST 1 VIEW COMPARISON:  03/12/2019 and earlier. FINDINGS: Portable AP semi upright view at 0648 hours. Lower lung volumes. Stable cardiac size and mediastinal contours. Visualized tracheal air column is within normal limits. Coarse and interstitial bilateral pulmonary opacity. Some improved ventilation at the right lung base. Most confluent opacity now remains at the left lung base. No superimposed pneumothorax or effusion. Paucity of bowel gas in the upper abdomen. No acute osseous abnormality identified. IMPRESSION: 1. Continued bilateral pneumonia with improved ventilation at the right lung base since 03/03/2019. 2. No new cardiopulmonary abnormality. Electronically Signed   By: Genevie Ann M.D.   On: 02/25/2019 07:57   DG Chest Portable 1 View  Result Date: 02/25/2019 CLINICAL DATA:  Shortness of breath, COVID positive EXAM: PORTABLE CHEST 1 VIEW COMPARISON:  None. FINDINGS: Cardiomediastinal contours are normal. There is diffuse interstitial and airspace opacity throughout both the right and left chest. No signs of dense consolidation or evidence of pleural effusion. No acute bone finding. IMPRESSION: Diffuse interstitial and airspace opacity throughout both lungs. Findings which could be seen in viral or atypical pneumonia, also with COVID-19 infection. Asymmetric pulmonary edema could have similar appearance. Electronically Signed   By: Zetta Bills M.D.   On:  03/12/2019 13:01   DG Chest Port 1V today  Result Date: 03/02/2019 CLINICAL DATA:  Shortness of breath. Follow-up for COVID-19 pneumonia. EXAM: PORTABLE CHEST 1 VIEW COMPARISON:  02/25/2019 and earlier studies. FINDINGS: Hazy bilateral airspace lung opacities are without significant change from the most recent prior exam. No new lung abnormalities. No evidence of a pleural effusion and no pneumothorax. IMPRESSION: 1. No significant change from the most recent prior study. Persistent bilateral hazy airspace lung opacities consistent with COVID-19 pneumonia, with possible superimposed ARDS. Electronically Signed   By: Lajean Manes M.D.   On: 03/02/2019 13:12   ECHOCARDIOGRAM COMPLETE  Result Date: 02/27/2019   ECHOCARDIOGRAM REPORT   Patient Name:   Alexander Erickson Date of Exam: 02/27/2019 Medical Rec #:  [id number]  Height:       65.5 in Accession #:    [id number]   Weight:       187.4 lb Date of Birth:  1955/11/17   BSA:          1.94 m Patient Age:    63 years     BP:           109/71 mmHg Patient Gender: M            HR:           102 bpm. Exam Location:  Inpatient Procedure: 2D Echo, Cardiac Doppler and Color Doppler Indications:    Acute respiratory failure.  History:        Patient has no prior history of Echocardiogram examinations.                 Abnormal ECG. Covid 19 positive. Hypoxia. Pneumonia.  Sonographer:    Sheralyn Boatman RDCS Referring Phys: [id number] [facility]  Sonographer Comments: Technically difficult study due to poor echo windows. IMPRESSIONS  1. There is a mass like structure present in the IVC. This prevents collpase of the IVC during the examination. Due to the patient's history of covid-19 infection and LE DVT, this likely represents a thrombus. RV size and function are normal.  2. Left ventricular ejection fraction, by visual estimation, is >75%. The left ventricle has hyperdynamic function. There is mildly increased left ventricular hypertrophy.  3. Small left ventricular internal  cavity size.  4. The left ventricle has no regional wall motion abnormalities.  5. Global right ventricle has normal systolic function.The right ventricular size is normal. No increase in right ventricular wall thickness.  6. Left atrial size was normal.  7. Right atrial size was normal.  8. Presence of pericardial fat pad.  9. The pericardial effusion is circumferential. 10. Trivial pericardial effusion is present. 11. The mitral valve is grossly normal. No evidence of mitral valve regurgitation. 12. The tricuspid valve is grossly normal. Tricuspid valve regurgitation is trivial. 13. The aortic valve is grossly normal. Aortic valve regurgitation is not visualized. No evidence of aortic valve sclerosis or stenosis. 14. The pulmonic valve was grossly normal. Pulmonic valve regurgitation is not visualized. 15. TR signal is inadequate for assessing pulmonary artery systolic pressure. 16. 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 17. The inferior vena cava is normal in size with <50% respiratory variability, suggesting right atrial pressure of 8 mmHg. FINDINGS  Left Ventricle: Left ventricular ejection fraction, by visual estimation, is >75%. The left ventricle has hyperdynamic function. The left ventricle has no regional wall motion abnormalities. The left ventricular internal cavity size was the LV cavity size is small. There is mildly increased left ventricular hypertrophy. Concentric left ventricular hypertrophy. Left ventricular diastolic parameters are consistent with age-related delayed relaxation (normal). Right Ventricle: The right ventricular size is normal. No increase in right ventricular wall thickness. Global RV systolic function is has normal systolic function. Left Atrium: Left atrial size was normal in size. Right Atrium: Right atrial size was normal in size Pericardium: Trivial pericardial effusion is present. The pericardial effusion is circumferential. Presence of pericardial fat pad. Mitral Valve: The  mitral valve is grossly normal. No evidence of mitral valve regurgitation. Tricuspid Valve: The tricuspid valve is grossly normal. Tricuspid valve regurgitation is trivial. Aortic Valve: The aortic valve is grossly normal. Aortic valve regurgitation is not visualized. The aortic valve is structurally normal, with no evidence of sclerosis or stenosis. Pulmonic Valve: The  pulmonic valve was grossly normal. Pulmonic valve regurgitation is not visualized. Pulmonic regurgitation is not visualized. Aorta: The aortic root is normal in size and structure. Venous: The inferior vena cava is normal in size with less than 50% respiratory variability, suggesting right atrial pressure of 8 mmHg. There is mass detected in the inferior vena cava. IAS/Shunts: No atrial level shunt detected by color flow Doppler.  LEFT VENTRICLE PLAX 2D LVIDd:         2.67 cm       Diastology LVIDs:         1.94 cm       LV e' lateral:   7.29 cm/s LV PW:         1.58 cm       LV E/e' lateral: 7.6 LV IVS:        1.20 cm       LV e' medial:    5.22 cm/s LVOT diam:     1.60 cm       LV E/e' medial:  10.6 LV SV:         15 ml LV SV Index:   7.22 LVOT Area:     2.01 cm  LV Volumes (MOD) LV area d, A2C:    12.70 cm LV area d, A4C:    17.80 cm LV area s, A2C:    8.18 cm LV area s, A4C:    9.86 cm LV major d, A2C:   4.92 cm LV major d, A4C:   6.84 cm LV major s, A2C:   4.51 cm LV major s, A4C:   5.77 cm LV vol d, MOD A2C: 27.7 ml LV vol d, MOD A4C: 40.2 ml LV vol s, MOD A2C: 12.9 ml LV vol s, MOD A4C: 14.3 ml LV SV MOD A2C:     14.8 ml LV SV MOD A4C:     40.2 ml LV SV MOD BP:      24.0 ml RIGHT VENTRICLE             IVC RV S prime:     22.40 cm/s  IVC diam: 1.53 cm TAPSE (M-mode): 1.7 cm LEFT ATRIUM             Index       RIGHT ATRIUM           Index LA diam:        2.40 cm 1.24 cm/m  RA Area:     13.30 cm LA Vol (A2C):   22.5 ml 11.63 ml/m RA Volume:   34.80 ml  17.98 ml/m LA Vol (A4C):   27.4 ml 14.16 ml/m LA Biplane Vol: 26.4 ml 13.64 ml/m   AORTIC VALVE LVOT Vmax:   133.00 cm/s LVOT Vmean:  105.000 cm/s LVOT VTI:    0.211 m  AORTA Ao Root diam: 2.90 cm MITRAL VALVE MV Area (PHT): 2.39 cm             SHUNTS MV PHT:        91.93 msec           Systemic VTI:  0.21 m MV Decel Time: 317 msec             Systemic Diam: 1.60 cm MV E velocity: 55.30 cm/s 103 cm/s MV A velocity: 89.20 cm/s 70.3 cm/s MV E/A ratio:  0.62       1.5  Lennie Odor MD Electronically signed by Lennie Odor MD Signature Date/Time: 02/27/2019/5:55:17 PM    Final  CT VENOGRAM ABD/PEL  Result Date: 02/28/2019 CLINICAL DATA:  Cardiac thrombus or embolic source suspected. Concern for thrombus within the proximal IVC seen on ECHO. EXAM: CT CHEST, ABDOMEN, AND PELVIS WITH CONTRAST TECHNIQUE: Multidetector CT imaging of the chest, abdomen and pelvis was performed following the standard protocol during bolus administration of intravenous contrast. CONTRAST:  OMNIPAQUE IOHEXOL 350 MG/ML SOLN COMPARISON:  None. FINDINGS: CT CHEST FINDINGS Cardiovascular: There is no large centrally located pulmonary embolism. The heart size is normal. There is no significant pericardial effusion. Coronary artery calcifications are noted. Minimal atherosclerotic changes are noted of the thoracic aorta without evidence for a thoracic aortic aneurysm. Mediastinum/Nodes: --there are prominent mediastinal and hilar lymph nodes, likely reactive. --No axillary lymphadenopathy. --No supraclavicular lymphadenopathy. --Normal thyroid gland. --The esophagus is unremarkable Lungs/Pleura: There are diffuse bilateral ground-glass airspace opacities involving all lobes to relatively equal degree. There is no pneumothorax. No significant pleural effusion. Musculoskeletal: No chest wall abnormality. No acute or significant osseous findings. Review of the MIP images confirms the above findings. CT ABDOMEN PELVIS FINDINGS Hepatobiliary: The liver is normal. Normal gallbladder.There is no biliary ductal dilation.  Pancreas: Normal contours without ductal dilatation. No peripancreatic fluid collection. Spleen: No splenic laceration or hematoma. Adrenals/Urinary Tract: --Adrenal glands: No adrenal hemorrhage. --Right kidney/ureter: No hydronephrosis or perinephric hematoma. --Left kidney/ureter: No hydronephrosis or perinephric hematoma. --Urinary bladder: Unremarkable. Stomach/Bowel: --Stomach/Duodenum: No hiatal hernia or other gastric abnormality. Normal duodenal course and caliber. --Small bowel: No dilatation or inflammation. --Colon: No focal abnormality. --Appendix: Normal. Vascular/Lymphatic: Normal course and caliber of the major abdominal vessels. There is no evidence for a thrombus within the IVC. --No retroperitoneal lymphadenopathy. --No mesenteric lymphadenopathy. --No pelvic or inguinal lymphadenopathy. Reproductive: Unremarkable Other: No ascites or free air. 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 Musculoskeletal. No acute displaced fractures. IMPRESSION: 1. No CT evidence for IVC thrombus. 2. Diffuse bilateral ground-glass airspace opacities consistent with the patient's history of viral pneumonia. 3. Prominent mediastinal and hilar lymph nodes, likely reactive. Aortic Atherosclerosis (ICD10-I70.0). Electronically Signed   By: Katherine Mantle M.D.   On: 02/28/2019 00:45   VAS [place] LOWER EXTREMITY VENOUS (DVT)  Result Date: 02/24/2019  Lower Venous Study Indications: Elevated Ddimer.  Risk Factors: COVID 19 positive. Comparison Study: No prior studies. Performing Technologist: Chanda Busing RVT  Examination Guidelines: A complete evaluation includes B-mode imaging, spectral Doppler, color Doppler, and power Doppler as needed of all accessible portions of each vessel. Bilateral testing is considered an integral part of a complete examination. Limited examinations for reoccurring indications may be performed as noted.  +---------+---------------+---------+-----------+----------+--------------+  RIGHT    CompressibilityPhasicitySpontaneityPropertiesThrombus Aging +---------+---------------+---------+-----------+----------+--------------+ CFV      Full           Yes      Yes                                 +---------+---------------+---------+-----------+----------+--------------+ SFJ      Full                                                        +---------+---------------+---------+-----------+----------+--------------+ FV Prox  Full                                                        +---------+---------------+---------+-----------+----------+--------------+  FV Mid   Full                                                        +---------+---------------+---------+-----------+----------+--------------+ FV DistalFull                                                        +---------+---------------+---------+-----------+----------+--------------+ PFV      Full                                                        +---------+---------------+---------+-----------+----------+--------------+ POP      Full           Yes      Yes                                 +---------+---------------+---------+-----------+----------+--------------+ PTV      Partial                                      Acute          +---------+---------------+---------+-----------+----------+--------------+ PERO     Full                                                        +---------+---------------+---------+-----------+----------+--------------+   +---------+---------------+---------+-----------+----------+--------------+ LEFT     CompressibilityPhasicitySpontaneityPropertiesThrombus Aging +---------+---------------+---------+-----------+----------+--------------+ CFV      Full           Yes      Yes                                 +---------+---------------+---------+-----------+----------+--------------+ SFJ      Full                                                         +---------+---------------+---------+-----------+----------+--------------+ FV Prox  Full                                                        +---------+---------------+---------+-----------+----------+--------------+ FV Mid   Full                                                        +---------+---------------+---------+-----------+----------+--------------+  FV DistalFull                                                        +---------+---------------+---------+-----------+----------+--------------+ PFV      Full                                                        +---------+---------------+---------+-----------+----------+--------------+ POP      Full           Yes      Yes                                 +---------+---------------+---------+-----------+----------+--------------+ PTV      Full                                                        +---------+---------------+---------+-----------+----------+--------------+ PERO     Full                                                        +---------+---------------+---------+-----------+----------+--------------+     Summary: Right: Findings consistent with acute deep vein thrombosis involving the right posterior tibial veins. No cystic structure found in the popliteal fossa. Left: There is no evidence of deep vein thrombosis in the lower extremity. No cystic structure found in the popliteal fossa.  *See table(s) above for measurements and observations. Electronically signed by Waverly Ferrarihristopher Dickson MD on 02/24/2019 at 3:55:02 PM.    Final

## 2019-03-02 NOTE — Plan of Care (Signed)
Discussed plan of care for the evening, pain management and importance of using incentive spirometry with some teach back displayed.  Patient stated he had updated his son tonight.

## 2019-03-02 NOTE — Progress Notes (Signed)
Early am O2 sats 84-88% on HFNC/NRB. Pt not moving much and becoming more dependent on staff. RN encouraged Alexander Erickson to get up and move to help his lungs. The patient was in agreement. Provider Ghimire in to see the patient this am and overheard the nurses instructions/plan of care for the patient and was also in agreement. It was discussed to get a portable chest xray to see if the patient was having lung improvement or developing decline in condition. Alexander Erickson was repositioned every 2 hours today and agreed to moving in bed to the right side which he was not agreeable to earlier. The patient stated his anxiety was making him feel worse with the breathing. Rn provided him with his as needed medication of xanax to which the patient was able to relax. His heart rate decreased from  118 beats per minute down to current 95 beats per minute. With his heart rate improved RN moved patient with a stand/pivot and sit to chair move. His bilateral lower extremities were weak and he could benefit with moving and working his muscles more. Also, it would benefit him with small movements in the chair or bed to distract him from his anxiety. After moving to the chair and performing his breathing exercises, Alexander Erickson oxygen saturation improved to 99% with his respirations declining from 25-30 to 20-24 breaths per minute. While Alexander Erickson sat in the chair I was able to massage his muscles with lotion from legs to arms and back. He tolerated the massage without issue. He is now finishing his meal and holding his oxygen at 96-99%. This RN will continue to monitor Alexander Erickson and encourage him to move. I will also pass on to night shift to encourage him to move his legs and arms more. The patients son Alexander Erickson was called earlier today and made aware of the plan so he too can encourage his dad to participate in his care.

## 2019-03-02 NOTE — Progress Notes (Signed)
OT Cancellation Note  Patient Details Name: Alexander Erickson MRN: [id number] DOB: Apr 14, 1955   Cancelled Treatment:    Reason Eval/Treat Not Completed: Other (comment) Pt recently having coughing spell after drinking milk and having it "go down the wrong way". OT to return at later time to initiate OT POC.   Zenovia Jarred, MSOT, OTR/L [place] OT/ Acute Relief OT [facility] Office: Guadalupe Guerra 03/02/2019, 4:46 PM

## 2019-03-03 LAB — COMPREHENSIVE METABOLIC PANEL
ALT: 26 U/L (ref 0–44)
AST: 36 U/L (ref 15–41)
Albumin: 2.7 g/dL — ABNORMAL LOW (ref 3.5–5.0)
Alkaline Phosphatase: 189 U/L — ABNORMAL HIGH (ref 38–126)
Anion gap: 14 (ref 5–15)
BUN: 41 mg/dL — ABNORMAL HIGH (ref 8–23)
CO2: 28 mmol/L (ref 22–32)
Calcium: 8.5 mg/dL — ABNORMAL LOW (ref 8.9–10.3)
Chloride: 94 mmol/L — ABNORMAL LOW (ref 98–111)
Creatinine, Ser: 0.88 mg/dL (ref 0.61–1.24)
GFR calc Af Amer: >60 mL/min (ref >60)
GFR calc non Af Amer: >60 mL/min (ref >60)
Glucose, Bld: 149 mg/dL — ABNORMAL HIGH (ref 70–99)
Potassium: 4.3 mmol/L (ref 3.5–5.1)
Sodium: 136 mmol/L (ref 135–145)
Total Bilirubin: 1.1 mg/dL (ref 0.3–1.2)
Total Protein: 6.6 g/dL (ref 6.5–8.1)

## 2019-03-03 LAB — GLUCOSE, CAPILLARY
Glucose-Capillary: 227 mg/dL — ABNORMAL HIGH (ref 70–99)
Glucose-Capillary: 181 mg/dL — ABNORMAL HIGH (ref 70–99)
Glucose-Capillary: 236 mg/dL — ABNORMAL HIGH (ref 70–99)

## 2019-03-03 LAB — CBC
HCT: 53 % — ABNORMAL HIGH (ref 39.0–52.0)
Hemoglobin: 18 g/dL — ABNORMAL HIGH (ref 13.0–17.0)
MCH: 29.8 pg (ref 26.0–34.0)
MCHC: 34 g/dL (ref 30.0–36.0)
MCV: 87.6 fL (ref 80.0–100.0)
Platelets: 131 10*3/uL — ABNORMAL LOW (ref 150–400)
RBC: 6.05 MIL/uL — ABNORMAL HIGH (ref 4.22–5.81)
RDW: 12.8 % (ref 11.5–15.5)
WBC: 13.2 10*3/uL — ABNORMAL HIGH (ref 4.0–10.5)
nRBC: 0.5 % — ABNORMAL HIGH (ref 0.0–0.2)

## 2019-03-03 LAB — C-REACTIVE PROTEIN: CRP: 1.9 mg/dL — ABNORMAL HIGH (ref <1.0)

## 2019-03-03 LAB — FERRITIN: Ferritin: 1111 ng/mL — ABNORMAL HIGH (ref 24–336)

## 2019-03-03 LAB — D-DIMER, QUANTITATIVE: D-Dimer, Quant: 2.48 ug/mL-FEU — ABNORMAL HIGH (ref 0.00–0.50)

## 2019-03-03 NOTE — Progress Notes (Signed)
PROGRESS NOTE                                                                                                                                                                                                             Patient Demographics:    Alexander Erickson, is a 63 y.o. male, DOB - 1955-03-31, WUJ:[id number]  Outpatient Primary MD for the patient is Patient, No Pcp Per   Admit date - 03/09/2019   LOS - 9  No chief complaint on file.      Brief Narrative: Patient is a 63 y.o. male with PMHx of HTN, HLD-who presented with 1 week history of progressive cough and shortness of breath-he was found to have acute hypoxemic respiratory failure in the setting of COVID-19 pneumonia along with severe hyponatremia.   Subjective:   Doing essentially the same-Per nursing staff-he was able to prone for a few hours yesterday-his O2 saturation during that time was almost 100%.  Still on NRB and 15 L of HFNC   Assessment  & Plan :   Acute Hypoxic Resp Failure due to Covid 19 Viral pneumonia and possible concurrent bacterial pneumonia: Remains essentially unchanged-continue supportive care with as needed 100% NRB and 15 L of HFNC.  Completed 10 days of remdesivir on 12/12, remains on Decadron.  Completed empiric Rocephin on 12/8.  Fever: afebrile  O2 requirements:  SpO2: 92 % O2 Flow Rate (L/min): 15 L/min + 100% O2 via NRB FiO2 (%): 100 %   COVID-19 Labs: Recent Labs    03/01/19 0328 03/02/19 0316 03/03/19 0050  DDIMER 5.60* 3.66* 2.48*  FERRITIN 1,244* 1,047* 1,111*  CRP 0.8 0.7 1.9*       Component Value Date/Time   BNP 53.0 03/11/2019 1234    No results for input(s): PROCALCITON in the last 168 hours.  Lab Results  Component Value Date   SARSCOV2NAA Detected (A) 02/18/2019     COVID-19 Medications: Decadron 12/4 > Remdesivir 12/4 >  Actemra 12/6  Other medications: Diuretics:Euvolemic-continue Lasix to  maintain negative balance (-6.9 L so far) Antibiotics: Completed Rocephin x5 days on 12/8-given elevated procalcitonin level Insulin: CBG stable on SSI.  A1c 6.8-May need initiation of metformin on discharge.   Prone/Incentive Spirometry: encouraged patient to lie prone for 3-4 hours at a time for a total of 16 hours a day, and to  encourage incentive spirometry use 3-4/hour  DVT Prophylaxis  :  Lovenox -therapeutic dosing  Right lower extremity DVT with possible IVC clot seen on echo: Continue full dose Lovenox.  CT chest did not show a large central pulmonary thrombus.  CT venogram of the chest/abdomen and pelvis did not show any IVC clot, had discussed case with [facility]-Dr. Towanda Malkin at this time recommends continuing anticoagulation without the need for thrombolytics or any other vascular procedures.  .  Hyponatremia: Improving-continue cautious diuresis.  Dyslipidemia: Continue statin  Prolonged QTC: Improved-continue to follow periodically.  Avoid QTC prolonging agents, follow potassium and magnesium  Obesity: Estimated body mass index is 28.01 kg/m as calculated from the following:   Height as of this encounter: 5' 5.5" (1.664 m).   Weight as of this encounter: 77.5 kg.    GI prophylaxis: PPI  Consults  :  None  Procedures  :  None  ABG: No results found for: PHART, PCO2ART, PO2ART, HCO3, TCO2, ACIDBASEDEF, O2SAT  Vent Settings: N/a  Condition - Extremely Guarded-very tenuous with risk for further deterioration  Family Communication  : Left a voicemail for son.  Code Status :  Full Code  Diet :  Diet Order            Diet Carb Modified Fluid consistency: Thin; Room service appropriate? Yes  Diet effective now               Disposition Plan  :  Remain hospitalized-transfer to ICU for close monitoring-and for heated high flow.  Barriers to discharge: Hypoxia requiring O2 supplementation  Antimicorbials  :    Anti-infectives (From admission, onward)   Start      Dose/Rate Route Frequency Ordered Stop   02/27/19 1500  remdesivir 100 mg in sodium chloride 0.9 % 100 mL IVPB    Note to Pharmacy: To complete a total of 10 days of therapy   100 mg 200 mL/hr over 30 Minutes Intravenous Daily 02/27/19 1320 03/04/19 0959   02/23/19 1000  remdesivir 100 mg in sodium chloride 0.9 % 100 mL IVPB  Status:  Discontinued     100 mg 200 mL/hr over 30 Minutes Intravenous Daily 03/03/2019 2057 03/03/2019 2102   02/23/19 1000  remdesivir 100 mg in sodium chloride 0.9 % 100 mL IVPB     100 mg 200 mL/hr over 30 Minutes Intravenous Daily 03/16/2019 2104 02/26/19 0915   02/20/2019 2315  doxycycline (VIBRA-TABS) tablet 100 mg  Status:  Discontinued     100 mg Oral Every 12 hours 02/28/2019 2306 02/23/19 1657   03/05/2019 2100  remdesivir 200 mg in sodium chloride 0.9% 250 mL IVPB  Status:  Discontinued     200 mg 580 mL/hr over 30 Minutes Intravenous Once 03/02/2019 2057 03/09/2019 2102   03/14/2019 2100  cefTRIAXone (ROCEPHIN) 2 g in sodium chloride 0.9 % 100 mL IVPB     2 g 200 mL/hr over 30 Minutes Intravenous Every 24 hours 03/07/2019 2058 02/26/19 2215   03/07/2019 2100  azithromycin (ZITHROMAX) tablet 500 mg  Status:  Discontinued     500 mg Oral Daily 03/10/2019 2058 02/19/2019 2306      Inpatient Medications  Scheduled Meds: . Chlorhexidine Gluconate Cloth  6 each Topical Daily  . cholecalciferol  1,000 Units Oral Daily  . dexamethasone (DECADRON) injection  6 mg Intravenous Q24H  . enoxaparin (LOVENOX) injection  1 mg/kg Subcutaneous Q12H  . furosemide  40 mg Intravenous Daily  . insulin aspart  0-9 Units Subcutaneous  TID WC  . mouth rinse  15 mL Mouth Rinse BID  . metoprolol tartrate  12.5 mg Oral BID  . pantoprazole  40 mg Oral Daily  . rosuvastatin  20 mg Oral q1800  . sodium chloride flush  3 mL Intravenous Q12H  . vitamin C  500 mg Oral Daily  . zinc sulfate  220 mg Oral Daily   Continuous Infusions: . remdesivir 100 mg in NS 100 mL Stopped (03/02/19 1850)   PRN  Meds:.acetaminophen, ALPRAZolam, HYDROcodone-acetaminophen, influenza vac split quadrivalent PF, labetalol, lip balm, phenol, sodium chloride   Time Spent in minutes 40   See all Orders from today for further details   Jeoffrey Massed M.D on 03/03/2019 at 11:35 AM  To page go to [email or web address] - use universal password  [facility] -  Office  [phone]    Objective:   Vitals:   03/03/19 0700 03/03/19 0816 03/03/19 0929 03/03/19 1114  BP: 104/72     Pulse: 86   (!) 106  Resp: 20   (!) 22  Temp: 98.5 F (36.9 C)     TempSrc: Axillary     SpO2: 92% 96%  96%  Weight:   77.5 kg   Height:        Wt Readings from Last 3 Encounters:  03/03/19 77.5 kg  03/03/2019 86.2 kg     Intake/Output Summary (Last 24 hours) at 03/03/2019 1135 Last data filed at 03/03/2019 0459 Gross per 24 hour  Intake 665 ml  Output 1300 ml  Net -635 ml     Physical Exam Gen Exam:Alert awake-not in any distress HEENT:atraumatic, normocephalic Chest: B/L clear to auscultation anteriorly CVS:S1S2 regular Abdomen:soft non tender, non distended Extremities:no edema Neurology: Non focal Skin: no rash   Data Review:    CBC Recent Labs  Lab 02/25/19 0258 02/26/19 0355 02/27/19 0315 02/28/19 0355 03/01/19 0328 03/02/19 0316 03/03/19 0050  WBC 6.1 8.5 9.1 12.7* 14.3* 15.6* 13.2*  HGB 16.2 17.8* 18.3* 19.6* 18.9* 17.9* 18.0*  HCT 46.6 52.3* 53.9* 57.0* 55.7* 53.2* 53.0*  PLT 149* 178 194 180 171 153 131*  MCV 85.5 86.9 86.5 86.8 88.1 88.5 87.6  MCH 29.7 29.6 29.4 29.8 29.9 29.8 29.8  MCHC 34.8 34.0 34.0 34.4 33.9 33.6 34.0  RDW 12.6 12.7 12.6 13.0 12.8 12.8 12.8  LYMPHSABS 0.6* 0.7 0.6*  --   --   --   --   MONOABS 0.1 0.1 0.2  --   --   --   --   EOSABS 0.0 0.0 0.0  --   --   --   --   BASOSABS 0.0 0.0 0.0  --   --   --   --     Chemistries  Recent Labs  Lab 02/25/19 0258 02/26/19 0355 02/27/19 0315 02/28/19 0355 03/01/19 0328 03/02/19 0316 03/03/19 0050  NA 130*  128* 133* 135 136 133* 136  K 3.9 4.1 3.8 4.2 4.1 4.4 4.3  CL 87* 82* 83* 91* 90* 91* 94*  CO2 27 26 32 28 29 29 28   GLUCOSE 135* 113* 114* 125* 106* 110* 149*  BUN 30* 35* 41* 49* 46* 40* 41*  CREATININE 0.93 0.85 0.87 0.94 0.94 0.93 0.88  CALCIUM 8.2* 7.9* 8.6* 8.7* 8.7* 8.4* 8.5*  MG 2.5* 2.5* 2.5* 2.6*  --  2.3  --   AST 46* 47* 40 38 39 34 36  ALT 37 36 34 28 27 23 26   ALKPHOS 175* 215* 239* 233* 213*  180* 189*  BILITOT 0.6 0.7 1.0 0.8 1.2 1.2 1.1   ------------------------------------------------------------------------------------------------------------------ No results for input(s): CHOL, HDL, LDLCALC, TRIG, CHOLHDL, LDLDIRECT in the last 72 hours.  Lab Results  Component Value Date   HGBA1C 6.8 (H) 02/27/2019   ------------------------------------------------------------------------------------------------------------------ No results for input(s): TSH, T4TOTAL, T3FREE, THYROIDAB in the last 72 hours.  Invalid input(s): FREET3 ------------------------------------------------------------------------------------------------------------------ Recent Labs    03/02/19 0316 03/03/19 0050  FERRITIN 1,047* 1,111*    Coagulation profile No results for input(s): INR, PROTIME in the last 168 hours.  Recent Labs    03/02/19 0316 03/03/19 0050  DDIMER 3.66* 2.48*    Cardiac Enzymes No results for input(s): CKMB, TROPONINI, MYOGLOBIN in the last 168 hours.  Invalid input(s): CK ------------------------------------------------------------------------------------------------------------------    Component Value Date/Time   BNP 53.0 2019-02-28 1234    Micro Results Recent Results (from the past 240 hour(s))  Culture, sputum-assessment     Status: None   Collection Time: 02/23/19  5:00 AM   Specimen: Sputum  Result Value Ref Range Status   Specimen Description SPUTUM  Final   Special Requests NONE  Final   Sputum evaluation   Final    THIS SPECIMEN IS ACCEPTABLE  FOR SPUTUM CULTURE Performed at [facility], [address] [place], [place] [address]    Report Status 02/23/2019 FINAL  Final  Culture, respiratory     Status: None   Collection Time: 02/23/19  5:00 AM   Specimen: SPU  Result Value Ref Range Status   Specimen Description   Final    SPUTUM Performed at [facility], [address] [place], [place] [address]    Special Requests   Final    NONE Reflexed from [id number] Performed at [facility], [address], [place], [place] [address]    Gram Stain   Final    RARE WBC PRESENT, PREDOMINANTLY PMN FEW GRAM POSITIVE COCCI IN CLUSTERS FEW GRAM NEGATIVE RODS FEW GRAM POSITIVE RODS    Culture   Final    FEW Consistent with normal respiratory flora. Performed at [facility], [address] [place], [place] [address]    Report Status 02/25/2019 FINAL  Final  MRSA PCR Screening     Status: None   Collection Time: 02/28/19  4:31 AM   Specimen: Nasal Mucosa; Nasopharyngeal  Result Value Ref Range Status   MRSA by PCR NEGATIVE NEGATIVE Final    Comment:        The GeneXpert MRSA Assay (FDA approved for NASAL specimens only), is one component of a comprehensive MRSA colonization surveillance program. It is not intended to diagnose MRSA infection nor to guide or monitor treatment for MRSA infections. Performed at [facility], [address], [place], [place] [address]     Radiology Reports CT CHEST W CONTRAST  Result Date: 02/28/2019 CLINICAL DATA:  Cardiac thrombus or embolic source suspected. Concern for thrombus within the proximal IVC seen on ECHO. EXAM: CT CHEST, ABDOMEN, AND PELVIS WITH CONTRAST TECHNIQUE: Multidetector CT imaging of the chest, abdomen and pelvis was performed following the standard protocol during bolus administration of intravenous contrast. CONTRAST:  OMNIPAQUE IOHEXOL 350 MG/ML SOLN COMPARISON:  None. FINDINGS: CT  CHEST FINDINGS Cardiovascular: There is no large centrally located pulmonary embolism. The heart size is normal. There is no significant pericardial effusion. Coronary artery calcifications are noted. Minimal atherosclerotic changes are noted of the thoracic aorta without evidence for a thoracic aortic aneurysm. Mediastinum/Nodes: --  there are prominent mediastinal and hilar lymph nodes, likely reactive. --No axillary lymphadenopathy. --No supraclavicular lymphadenopathy. --Normal thyroid gland. --The esophagus is unremarkable Lungs/Pleura: There are diffuse bilateral ground-glass airspace opacities involving all lobes to relatively equal degree. There is no pneumothorax. No significant pleural effusion. Musculoskeletal: No chest wall abnormality. No acute or significant osseous findings. Review of the MIP images confirms the above findings. CT ABDOMEN PELVIS FINDINGS Hepatobiliary: The liver is normal. Normal gallbladder.There is no biliary ductal dilation. Pancreas: Normal contours without ductal dilatation. No peripancreatic fluid collection. Spleen: No splenic laceration or hematoma. Adrenals/Urinary Tract: --Adrenal glands: No adrenal hemorrhage. --Right kidney/ureter: No hydronephrosis or perinephric hematoma. --Left kidney/ureter: No hydronephrosis or perinephric hematoma. --Urinary bladder: Unremarkable. Stomach/Bowel: --Stomach/Duodenum: No hiatal hernia or other gastric abnormality. Normal duodenal course and caliber. --Small bowel: No dilatation or inflammation. --Colon: No focal abnormality. --Appendix: Normal. Vascular/Lymphatic: Normal course and caliber of the major abdominal vessels. There is no evidence for a thrombus within the IVC. --No retroperitoneal lymphadenopathy. --No mesenteric lymphadenopathy. --No pelvic or inguinal lymphadenopathy. Reproductive: Unremarkable Other: No ascites or free air. 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 Musculoskeletal. No acute displaced fractures.  IMPRESSION: 1. No CT evidence for IVC thrombus. 2. Diffuse bilateral ground-glass airspace opacities consistent with the patient's history of viral pneumonia. 3. Prominent mediastinal and hilar lymph nodes, likely reactive. Aortic Atherosclerosis (ICD10-I70.0). Electronically Signed   By: Katherine Mantle M.D.   On: 02/28/2019 00:45   DG Chest Port 1 View  Result Date: 02/25/2019 CLINICAL DATA:  63 year old male COVID-19 pneumonia. EXAM: PORTABLE CHEST 1 VIEW COMPARISON:  03/20/2019 and earlier. FINDINGS: Portable AP semi upright view at 0648 hours. Lower lung volumes. Stable cardiac size and mediastinal contours. Visualized tracheal air column is within normal limits. Coarse and interstitial bilateral pulmonary opacity. Some improved ventilation at the right lung base. Most confluent opacity now remains at the left lung base. No superimposed pneumothorax or effusion. Paucity of bowel gas in the upper abdomen. No acute osseous abnormality identified. IMPRESSION: 1. Continued bilateral pneumonia with improved ventilation at the right lung base since 03/21/2019. 2. No new cardiopulmonary abnormality. Electronically Signed   By: Odessa Fleming M.D.   On: 02/25/2019 07:57   DG Chest Portable 1 View  Result Date: 03/20/2019 CLINICAL DATA:  Shortness of breath, COVID positive EXAM: PORTABLE CHEST 1 VIEW COMPARISON:  None. FINDINGS: Cardiomediastinal contours are normal. There is diffuse interstitial and airspace opacity throughout both the right and left chest. No signs of dense consolidation or evidence of pleural effusion. No acute bone finding. IMPRESSION: Diffuse interstitial and airspace opacity throughout both lungs. Findings which could be seen in viral or atypical pneumonia, also with COVID-19 infection. Asymmetric pulmonary edema could have similar appearance. Electronically Signed   By: Donzetta Kohut M.D.   On: 03/08/2019 13:01   DG Chest Port 1V today  Result Date: 03/02/2019 CLINICAL DATA:  Shortness  of breath. Follow-up for COVID-19 pneumonia. EXAM: PORTABLE CHEST 1 VIEW COMPARISON:  02/25/2019 and earlier studies. FINDINGS: Hazy bilateral airspace lung opacities are without significant change from the most recent prior exam. No new lung abnormalities. No evidence of a pleural effusion and no pneumothorax. IMPRESSION: 1. No significant change from the most recent prior study. Persistent bilateral hazy airspace lung opacities consistent with COVID-19 pneumonia, with possible superimposed ARDS. Electronically Signed   By: Amie Portland M.D.   On: 03/02/2019 13:12   ECHOCARDIOGRAM COMPLETE  Result Date: 02/27/2019   ECHOCARDIOGRAM REPORT   Patient Name:  Orson Gear Date of Exam: 02/27/2019 Medical Rec #:  [id number]    Height:       65.5 in Accession #:    [id number]   Weight:       187.4 lb Date of Birth:  Jul 04, 1955   BSA:          1.94 m Patient Age:    63 years     BP:           109/71 mmHg Patient Gender: M            HR:           102 bpm. Exam Location:  Inpatient Procedure: 2D Echo, Cardiac Doppler and Color Doppler Indications:    Acute respiratory failure.  History:        Patient has no prior history of Echocardiogram examinations.                 Abnormal ECG. Covid 19 positive. Hypoxia. Pneumonia.  Sonographer:    Sheralyn Boatman RDCS Referring Phys: [id number] [facility]  Sonographer Comments: Technically difficult study due to poor echo windows. IMPRESSIONS  1. There is a mass like structure present in the IVC. This prevents collpase of the IVC during the examination. Due to the patient's history of covid-19 infection and LE DVT, this likely represents a thrombus. RV size and function are normal.  2. Left ventricular ejection fraction, by visual estimation, is >75%. The left ventricle has hyperdynamic function. There is mildly increased left ventricular hypertrophy.  3. Small left ventricular internal cavity size.  4. The left ventricle has no regional wall motion abnormalities.  5. Global right  ventricle has normal systolic function.The right ventricular size is normal. No increase in right ventricular wall thickness.  6. Left atrial size was normal.  7. Right atrial size was normal.  8. Presence of pericardial fat pad.  9. The pericardial effusion is circumferential. 10. Trivial pericardial effusion is present. 11. The mitral valve is grossly normal. No evidence of mitral valve regurgitation. 12. The tricuspid valve is grossly normal. Tricuspid valve regurgitation is trivial. 13. The aortic valve is grossly normal. Aortic valve regurgitation is not visualized. No evidence of aortic valve sclerosis or stenosis. 14. The pulmonic valve was grossly normal. Pulmonic valve regurgitation is not visualized. 15. TR signal is inadequate for assessing pulmonary artery systolic pressure. 16. 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 17. The inferior vena cava is normal in size with <50% respiratory variability, suggesting right atrial pressure of 8 mmHg. FINDINGS  Left Ventricle: Left ventricular ejection fraction, by visual estimation, is >75%. The left ventricle has hyperdynamic function. The left ventricle has no regional wall motion abnormalities. The left ventricular internal cavity size was the LV cavity size is small. There is mildly increased left ventricular hypertrophy. Concentric left ventricular hypertrophy. Left ventricular diastolic parameters are consistent with age-related delayed relaxation (normal). Right Ventricle: The right ventricular size is normal. No increase in right ventricular wall thickness. Global RV systolic function is has normal systolic function. Left Atrium: Left atrial size was normal in size. Right Atrium: Right atrial size was normal in size Pericardium: Trivial pericardial effusion is present. The pericardial effusion is circumferential. Presence of pericardial fat pad. Mitral Valve: The mitral valve is grossly normal. No evidence of mitral valve regurgitation. Tricuspid Valve: The  tricuspid valve is grossly normal. Tricuspid valve regurgitation is trivial. Aortic Valve: The aortic valve is grossly normal. Aortic valve regurgitation is not visualized. The aortic  valve is structurally normal, with no evidence of sclerosis or stenosis. Pulmonic Valve: The pulmonic valve was grossly normal. Pulmonic valve regurgitation is not visualized. Pulmonic regurgitation is not visualized. Aorta: The aortic root is normal in size and structure. Venous: The inferior vena cava is normal in size with less than 50% respiratory variability, suggesting right atrial pressure of 8 mmHg. There is mass detected in the inferior vena cava. IAS/Shunts: No atrial level shunt detected by color flow Doppler.  LEFT VENTRICLE PLAX 2D LVIDd:         2.67 cm       Diastology LVIDs:         1.94 cm       LV e' lateral:   7.29 cm/s LV PW:         1.58 cm       LV E/e' lateral: 7.6 LV IVS:        1.20 cm       LV e' medial:    5.22 cm/s LVOT diam:     1.60 cm       LV E/e' medial:  10.6 LV SV:         15 ml LV SV Index:   7.22 LVOT Area:     2.01 cm  LV Volumes (MOD) LV area d, A2C:    12.70 cm LV area d, A4C:    17.80 cm LV area s, A2C:    8.18 cm LV area s, A4C:    9.86 cm LV major d, A2C:   4.92 cm LV major d, A4C:   6.84 cm LV major s, A2C:   4.51 cm LV major s, A4C:   5.77 cm LV vol d, MOD A2C: 27.7 ml LV vol d, MOD A4C: 40.2 ml LV vol s, MOD A2C: 12.9 ml LV vol s, MOD A4C: 14.3 ml LV SV MOD A2C:     14.8 ml LV SV MOD A4C:     40.2 ml LV SV MOD BP:      24.0 ml RIGHT VENTRICLE             IVC RV S prime:     22.40 cm/s  IVC diam: 1.53 cm TAPSE (M-mode): 1.7 cm LEFT ATRIUM             Index       RIGHT ATRIUM           Index LA diam:        2.40 cm 1.24 cm/m  RA Area:     13.30 cm LA Vol (A2C):   22.5 ml 11.63 ml/m RA Volume:   34.80 ml  17.98 ml/m LA Vol (A4C):   27.4 ml 14.16 ml/m LA Biplane Vol: 26.4 ml 13.64 ml/m  AORTIC VALVE LVOT Vmax:   133.00 cm/s LVOT Vmean:  105.000 cm/s LVOT VTI:    0.211 m  AORTA Ao  Root diam: 2.90 cm MITRAL VALVE MV Area (PHT): 2.39 cm             SHUNTS MV PHT:        91.93 msec           Systemic VTI:  0.21 m MV Decel Time: 317 msec             Systemic Diam: 1.60 cm MV E velocity: 55.30 cm/s 103 cm/s MV A velocity: 89.20 cm/s 70.3 cm/s MV E/A ratio:  0.62       1.5  Lennie Odor MD  Electronically signed by Eleonore Chiquito MD Signature Date/Time: 02/27/2019/5:55:17 PM    Final    CT VENOGRAM ABD/PEL  Result Date: 02/28/2019 CLINICAL DATA:  Cardiac thrombus or embolic source suspected. Concern for thrombus within the proximal IVC seen on ECHO. EXAM: CT CHEST, ABDOMEN, AND PELVIS WITH CONTRAST TECHNIQUE: Multidetector CT imaging of the chest, abdomen and pelvis was performed following the standard protocol during bolus administration of intravenous contrast. CONTRAST:  186mL OMNIPAQUE IOHEXOL 350 MG/ML SOLN COMPARISON:  None. FINDINGS: CT CHEST FINDINGS Cardiovascular: There is no large centrally located pulmonary embolism. The heart size is normal. There is no significant pericardial effusion. Coronary artery calcifications are noted. Minimal atherosclerotic changes are noted of the thoracic aorta without evidence for a thoracic aortic aneurysm. Mediastinum/Nodes: --there are prominent mediastinal and hilar lymph nodes, likely reactive. --No axillary lymphadenopathy. --No supraclavicular lymphadenopathy. --Normal thyroid gland. --The esophagus is unremarkable Lungs/Pleura: There are diffuse bilateral ground-glass airspace opacities involving all lobes to relatively equal degree. There is no pneumothorax. No significant pleural effusion. Musculoskeletal: No chest wall abnormality. No acute or significant osseous findings. Review of the MIP images confirms the above findings. CT ABDOMEN PELVIS FINDINGS Hepatobiliary: The liver is normal. Normal gallbladder.There is no biliary ductal dilation. Pancreas: Normal contours without ductal dilatation. No peripancreatic fluid collection. Spleen: No  splenic laceration or hematoma. Adrenals/Urinary Tract: --Adrenal glands: No adrenal hemorrhage. --Right kidney/ureter: No hydronephrosis or perinephric hematoma. --Left kidney/ureter: No hydronephrosis or perinephric hematoma. --Urinary bladder: Unremarkable. Stomach/Bowel: --Stomach/Duodenum: No hiatal hernia or other gastric abnormality. Normal duodenal course and caliber. --Small bowel: No dilatation or inflammation. --Colon: No focal abnormality. --Appendix: Normal. Vascular/Lymphatic: Normal course and caliber of the major abdominal vessels. There is no evidence for a thrombus within the IVC. --No retroperitoneal lymphadenopathy. --No mesenteric lymphadenopathy. --No pelvic or inguinal lymphadenopathy. Reproductive: Unremarkable Other: No ascites or free air. 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 Musculoskeletal. No acute displaced fractures. IMPRESSION: 1. No CT evidence for IVC thrombus. 2. Diffuse bilateral ground-glass airspace opacities consistent with the patient's history of viral pneumonia. 3. Prominent mediastinal and hilar lymph nodes, likely reactive. Aortic Atherosclerosis (ICD10-I70.0). Electronically Signed   By: Constance Holster M.D.   On: 02/28/2019 00:45   VAS [place] LOWER EXTREMITY VENOUS (DVT)  Result Date: 02/24/2019  Lower Venous Study Indications: Elevated Ddimer.  Risk Factors: COVID 19 positive. Comparison Study: No prior studies. Performing Technologist: Oliver Hum RVT  Examination Guidelines: A complete evaluation includes B-mode imaging, spectral Doppler, color Doppler, and power Doppler as needed of all accessible portions of each vessel. Bilateral testing is considered an integral part of a complete examination. Limited examinations for reoccurring indications may be performed as noted.  +---------+---------------+---------+-----------+----------+--------------+ RIGHT    CompressibilityPhasicitySpontaneityPropertiesThrombus Aging  +---------+---------------+---------+-----------+----------+--------------+ CFV      Full           Yes      Yes                                 +---------+---------------+---------+-----------+----------+--------------+ SFJ      Full                                                        +---------+---------------+---------+-----------+----------+--------------+ FV Prox  Full                                                        +---------+---------------+---------+-----------+----------+--------------+  FV Mid   Full                                                        +---------+---------------+---------+-----------+----------+--------------+ FV DistalFull                                                        +---------+---------------+---------+-----------+----------+--------------+ PFV      Full                                                        +---------+---------------+---------+-----------+----------+--------------+ POP      Full           Yes      Yes                                 +---------+---------------+---------+-----------+----------+--------------+ PTV      Partial                                      Acute          +---------+---------------+---------+-----------+----------+--------------+ PERO     Full                                                        +---------+---------------+---------+-----------+----------+--------------+   +---------+---------------+---------+-----------+----------+--------------+ LEFT     CompressibilityPhasicitySpontaneityPropertiesThrombus Aging +---------+---------------+---------+-----------+----------+--------------+ CFV      Full           Yes      Yes                                 +---------+---------------+---------+-----------+----------+--------------+ SFJ      Full                                                         +---------+---------------+---------+-----------+----------+--------------+ FV Prox  Full                                                        +---------+---------------+---------+-----------+----------+--------------+ FV Mid   Full                                                        +---------+---------------+---------+-----------+----------+--------------+  FV DistalFull                                                        +---------+---------------+---------+-----------+----------+--------------+ PFV      Full                                                        +---------+---------------+---------+-----------+----------+--------------+ POP      Full           Yes      Yes                                 +---------+---------------+---------+-----------+----------+--------------+ PTV      Full                                                        +---------+---------------+---------+-----------+----------+--------------+ PERO     Full                                                        +---------+---------------+---------+-----------+----------+--------------+     Summary: Right: Findings consistent with acute deep vein thrombosis involving the right posterior tibial veins. No cystic structure found in the popliteal fossa. Left: There is no evidence of deep vein thrombosis in the lower extremity. No cystic structure found in the popliteal fossa.  *See table(s) above for measurements and observations. Electronically signed by Waverly Ferrarihristopher Dickson MD on 02/24/2019 at 3:55:02 PM.    Final

## 2019-03-03 NOTE — Progress Notes (Signed)
ANTICOAGULATION CONSULT NOTE  Pharmacy Consult for Lovenox Indication: IVC thrombus   Patient Measurements: Height: 5' 5.5" (166.4 cm) Weight: 187 lb 6.3 oz (85 kg) IBW/kg (Calculated) : 62.65  Vital Signs: Temp: 98.5 F (36.9 C) (12/13 0700) Temp Source: Axillary (12/13 0700) BP: 104/72 (12/13 0700) Pulse Rate: 86 (12/13 0700)  Labs: Recent Labs    03/01/19 0328 03/02/19 0316 03/03/19 0050  HGB 18.9* 17.9* 18.0*  HCT 55.7* 53.2* 53.0*  PLT 171 153 131*  CREATININE 0.94 0.93 0.88    Estimated Creatinine Clearance: 85.9 mL/min (by C-G formula based on SCr of 0.88 mg/dL).   Medical History: Past Medical History:  Diagnosis Date  . Hyperlipidemia   . Hypertension     Medications:  Scheduled:  . Chlorhexidine Gluconate Cloth  6 each Topical Daily  . cholecalciferol  1,000 Units Oral Daily  . dexamethasone (DECADRON) injection  6 mg Intravenous Q24H  . enoxaparin (LOVENOX) injection  1 mg/kg Subcutaneous Q12H  . furosemide  40 mg Intravenous Daily  . insulin aspart  0-9 Units Subcutaneous TID WC  . mouth rinse  15 mL Mouth Rinse BID  . metoprolol tartrate  12.5 mg Oral BID  . pantoprazole  40 mg Oral Daily  . rosuvastatin  20 mg Oral q1800  . sodium chloride flush  3 mL Intravenous Q12H  . vitamin C  500 mg Oral Daily  . zinc sulfate  220 mg Oral Daily   Infusions:  . remdesivir 100 mg in NS 100 mL Stopped (03/02/19 1850)   PRN: acetaminophen, ALPRAZolam, HYDROcodone-acetaminophen, influenza vac split quadrivalent PF, labetalol, lip balm, phenol, sodium chloride  Assessment: 63 yo male with COVID-19 pneumonia. Pt was receiving enoxaparin for VTE ppx when D-dimer was elevated and dopplers confirmed DVT. Pt on therapeutic enoxaparin. Cr stable, H/H wnl, pltc down slightly, D-dimer trending down.   Goal of Therapy:  Monitor platelets by anticoagulation protocol: Yes   Plan:  -Enoxaparin 1mg /kg SQ BID -Watch pltc, d-dimer, Cr  Arrie Senate, PharmD,  BCPS Clinical Pharmacist Please check AMION for all [facility] Pharmacy numbers 03/03/2019

## 2019-03-03 NOTE — Plan of Care (Signed)
Discussed plan of care for the evening, pain management and importance of doing things for himself but pacing to take breaks in between with some teach back displayed.  Will update his son later this evening.

## 2019-03-03 NOTE — Progress Notes (Signed)
Occupational Therapy Evaluation Patient Details Name: Alexander Erickson MRN: [id number] DOB: [birth year] Today's Date: 03/03/2019    History of Present Illness Pt is a 63 y.o. male admitted 03/07/2019 with progressive cough and dyspnea, found to be hypoxic with COVID-19 (initial positive 11/30). Pt with RLE DVT, echo suggested IVC clot, CT venogram without clots; pt not stable for CTA to r/o PE. PMH includes HTN, HLD.   Clinical Impression   PTA pt lived with his brother, independent in all ADLs, IADLs, and mobility. Pt reports ambulating without an assistive device and reports 0 falls in the last 6 months. Pt does not use O2 at home and is currently on 25L HHFNC 90% FiO2 and 15L NRB. Pt currently requires setup to mod assist for self-care and functional transfer tasks. Pt tolerated sitting edge of bed ~15 min with supervision as well as able to transfer to bedside chair with min hand held assist. Pt's SpO2 dropped to low 80s with activity with pt requiring mod seated rest break for SpO2 to return to 95%. HR fluctuaged between 101 - 111 bpm. Educated pt on pursed lip breathing strategies as well as relaxation techniques to address anxiety related to shortness of breath. 3/4 DOE. Pt demonstrates decreased strength, endurance, balance, standing tolerance, and activity tolerance impacting ability to complete self-care and functional transfer tasks. Recommend skilled OT services to address above deficits in order to promote function and prevent further decline. Recommend HH OT for additional rehab following hospital discharge.    Follow Up Recommendations  Home health OT;Supervision/Assistance - 24 hour(Pt may require SNF placement, depending on progress)    Equipment Recommendations  3 in 1 bedside commode(for use in shower)    Recommendations for Other Services       Precautions / Restrictions Precautions Precautions: Fall;Other (comment) Precaution Comments: HHFNC 25L 90% FiO2 +15L  NRB Restrictions Weight Bearing Restrictions: No      Mobility Bed Mobility Overal bed mobility: Needs Assistance Bed Mobility: Supine to Sit     Supine to sit: Mod assist;HOB elevated     General bed mobility comments: Increased time and assist with trunk  Transfers Overall transfer level: Needs assistance Equipment used: 1 person hand held assist Transfers: Sit to/from [employer] Sit to Stand: Min assist Stand pivot transfers: Min assist       General transfer comment: Pt able to transfer to bedside chair with min hand held assist. Noted 0 instances of LOB, however pt unsteady on feet.    Balance Overall balance assessment: Needs assistance   Sitting balance-Leahy Scale: Good       Standing balance-Leahy Scale: Poor                             ADL either performed or assessed with clinical judgement   ADL Overall ADL's : Needs assistance/impaired Eating/Feeding: Set up;Sitting   Grooming: Minimal assistance;Sitting   Upper Body Bathing: Minimal assistance;Sitting   Lower Body Bathing: Moderate assistance;Sit to/from stand   Upper Body Dressing : Minimal assistance;Sitting   Lower Body Dressing: Moderate assistance;Sit to/from stand   Toilet Transfer: Minimal assistance;BSC   Toileting- Clothing Manipulation and Hygiene: Minimal assistance;Sit to/from stand;Moderate assistance       Functional mobility during ADLs: Minimal assistance(hand held assist)       Vision Baseline Vision/History: Wears glasses Wears Glasses: At all times       Perception     Praxis  Pertinent Vitals/Pain Pain Assessment: No/denies pain     Hand Dominance Right   Extremity/Trunk Assessment Upper Extremity Assessment Upper Extremity Assessment: Generalized weakness   Lower Extremity Assessment Lower Extremity Assessment: Defer to PT evaluation       Communication Communication Communication: No difficulties   Cognition  Arousal/Alertness: Awake/alert Behavior During Therapy: WFL for tasks assessed/performed Overall Cognitive Status: Within Functional Limits for tasks assessed                                     General Comments  Educated pt on safety strategies, energy conservation, and activity modifications. Pt's SpO2 fluctuated between 84% - 95% on HHFNC 25L 90%FiO2 + 15L NRB    Exercises Exercises: Other exercises Other Exercises Other Exercises: Pursed lip breathing throughout with mod cues on technique   Shoulder Instructions      Home Living Family/patient expects to be discharged to:: Private residence Living Arrangements: Other (Comment);Other relatives(Brother) Available Help at Discharge: Family;Available 24 hours/day Type of Home: House Home Access: Stairs to enter [employer] CorporationEntrance Stairs-Number of Steps: 2-3 Entrance Stairs-Rails: Right Home Layout: One level     Bathroom Shower/Tub: Chief Strategy OfficerTub/shower unit   Bathroom Toilet: Standard     Home Equipment: None          Prior Functioning/Environment Level of Independence: Independent        Comments: Pt independent with all ADLs, IADLs, and mobility. Pt still drives. Pt is retired. Pt does not use oxygen at home. Pt reports 0 falls in the last 6 months.        OT Problem List: Decreased strength;Decreased activity tolerance;Impaired balance (sitting and/or standing);Decreased knowledge of use of DME or AE;Cardiopulmonary status limiting activity      OT Treatment/Interventions: Self-care/ADL training;Therapeutic exercise;Neuromuscular education;Energy conservation;DME and/or AE instruction;Therapeutic activities;Patient/family education;Balance training    OT Goals(Current goals can be found in the care plan section) Acute Rehab OT Goals Patient Stated Goal: To go home Time For Goal Achievement: 03/16/2019 Potential to Achieve Goals: Good ADL Goals Pt Will Perform Grooming: with modified independence;standing Pt Will  Perform Lower Body Bathing: with modified independence;sit to/from stand Pt Will Perform Lower Body Dressing: with modified independence;sit to/from stand Pt Will Transfer to Toilet: with modified independence;ambulating Pt Will Perform Toileting - Clothing Manipulation and hygiene: with modified independence;sit to/from stand Additional ADL Goal #1: Pt to recall and demonstrate breathing exercises with 0 verbal cues on technique.  OT Frequency: Min 3X/week   Barriers to D/C:            Co-evaluation              AM-PAC OT "6 Clicks" Daily Activity     Outcome Measure Help from another person eating meals?: A Little Help from another person taking care of personal grooming?: A Little Help from another person toileting, which includes using toliet, bedpan, or urinal?: A Little Help from another person bathing (including washing, rinsing, drying)?: A Lot Help from another person to put on and taking off regular upper body clothing?: A Little Help from another person to put on and taking off regular lower body clothing?: A Lot 6 Click Score: 16   End of Session Equipment Utilized During Treatment: Oxygen Nurse Communication: Mobility status  Activity Tolerance: Patient limited by fatigue(Limited by shortness of breath and anxiety) Patient left: in chair;with call bell/phone within reach  OT Visit Diagnosis: Unsteadiness on feet (R26.81);Muscle weakness (  generalized) (M62.81)                Time: 1010-1101 OT Time Calculation (min): 51 min Charges:  OT General Charges $OT Visit: 1 Visit OT Evaluation $OT Eval Moderate Complexity: 1 Mod OT Treatments $Therapeutic Activity: 23-37 mins  Mauri Brooklyn OTR/L [phone]   Mauri Brooklyn 03/03/2019, 1:30 PM

## 2019-03-03 NOTE — Progress Notes (Signed)
Updated his son about his breathing and appetite improving as far as recovery, no further questions at this time.

## 2019-03-04 LAB — CBC
HCT: 54.6 % — ABNORMAL HIGH (ref 39.0–52.0)
Hemoglobin: 18.3 g/dL — ABNORMAL HIGH (ref 13.0–17.0)
MCH: 29.9 pg (ref 26.0–34.0)
MCHC: 33.5 g/dL (ref 30.0–36.0)
MCV: 89.1 fL (ref 80.0–100.0)
Platelets: 129 10*3/uL — ABNORMAL LOW (ref 150–400)
RBC: 6.13 MIL/uL — ABNORMAL HIGH (ref 4.22–5.81)
RDW: 13 % (ref 11.5–15.5)
WBC: 13.3 10*3/uL — ABNORMAL HIGH (ref 4.0–10.5)
nRBC: 0.2 % (ref 0.0–0.2)

## 2019-03-04 LAB — GLUCOSE, CAPILLARY
Glucose-Capillary: 112 mg/dL — ABNORMAL HIGH (ref 70–99)
Glucose-Capillary: 115 mg/dL — ABNORMAL HIGH (ref 70–99)
Glucose-Capillary: 176 mg/dL — ABNORMAL HIGH (ref 70–99)
Glucose-Capillary: 303 mg/dL — ABNORMAL HIGH (ref 70–99)
Glucose-Capillary: 218 mg/dL — ABNORMAL HIGH (ref 70–99)

## 2019-03-04 LAB — COMPREHENSIVE METABOLIC PANEL
ALT: 33 U/L (ref 0–44)
AST: 37 U/L (ref 15–41)
Albumin: 2.6 g/dL — ABNORMAL LOW (ref 3.5–5.0)
Alkaline Phosphatase: 171 U/L — ABNORMAL HIGH (ref 38–126)
Anion gap: 12 (ref 5–15)
BUN: 40 mg/dL — ABNORMAL HIGH (ref 8–23)
CO2: 34 mmol/L — ABNORMAL HIGH (ref 22–32)
Calcium: 8.6 mg/dL — ABNORMAL LOW (ref 8.9–10.3)
Chloride: 92 mmol/L — ABNORMAL LOW (ref 98–111)
Creatinine, Ser: 0.92 mg/dL (ref 0.61–1.24)
GFR calc Af Amer: >60 mL/min (ref >60)
GFR calc non Af Amer: >60 mL/min (ref >60)
Glucose, Bld: 101 mg/dL — ABNORMAL HIGH (ref 70–99)
Potassium: 4.5 mmol/L (ref 3.5–5.1)
Sodium: 138 mmol/L (ref 135–145)
Total Bilirubin: 1 mg/dL (ref 0.3–1.2)
Total Protein: 7 g/dL (ref 6.5–8.1)

## 2019-03-04 LAB — D-DIMER, QUANTITATIVE: D-Dimer, Quant: 2.19 ug/mL-FEU — ABNORMAL HIGH (ref 0.00–0.50)

## 2019-03-04 LAB — C-REACTIVE PROTEIN: CRP: 1.9 mg/dL — ABNORMAL HIGH (ref <1.0)

## 2019-03-04 LAB — FERRITIN: Ferritin: 1309 ng/mL — ABNORMAL HIGH (ref 24–336)

## 2019-03-04 MED ORDER — ENOXAPARIN SODIUM 80 MG/0.8ML ~~LOC~~ SOLN
80.0000 mg | Freq: Two times a day (BID) | SUBCUTANEOUS | Status: DC
  Administered 2019-03-04 – 2019-03-15 (×20): 80 mg via SUBCUTANEOUS
  Filled 2019-03-04 (×22): qty 0.8

## 2019-03-04 NOTE — Plan of Care (Addendum)
Pt remained in bed today and plans to self-prone this afternoon. Still on heated HFNC 30L and 100% as well as a non-rebreather. Pt required more oxygen when eating his meals and was increased to 40L and 100% to maintain O2 sat goals. Son was updated and all questions were answered.   Problem: Education: Goal: Knowledge of risk factors and measures for prevention of condition will improve Outcome: Progressing   Problem: Coping: Goal: Psychosocial and spiritual needs will be supported Outcome: Progressing   Problem: Respiratory: Goal: Will maintain a patent airway Outcome: Progressing Goal: Complications related to the disease process, condition or treatment will be avoided or minimized Outcome: Progressing   Problem: Education: Goal: Knowledge of General Education information will improve Description: Including pain rating scale, medication(s)/side effects and non-pharmacologic comfort measures Outcome: Progressing   Problem: Health Behavior/Discharge Planning: Goal: Ability to manage health-related needs will improve Outcome: Progressing   Problem: Clinical Measurements: Goal: Ability to maintain clinical measurements within normal limits will improve Outcome: Progressing Goal: Will remain free from infection Outcome: Progressing Goal: Diagnostic test results will improve Outcome: Progressing Goal: Respiratory complications will improve Outcome: Progressing Goal: Cardiovascular complication will be avoided Outcome: Progressing   Problem: Activity: Goal: Risk for activity intolerance will decrease Outcome: Progressing   Problem: Nutrition: Goal: Adequate nutrition will be maintained Outcome: Progressing   Problem: Coping: Goal: Level of anxiety will decrease Outcome: Progressing   Problem: Elimination: Goal: Will not experience complications related to bowel motility Outcome: Progressing Goal: Will not experience complications related to urinary retention Outcome:  Progressing   Problem: Pain Managment: Goal: General experience of comfort will improve Outcome: Progressing   Problem: Safety: Goal: Ability to remain free from injury will improve Outcome: Progressing   Problem: Skin Integrity: Goal: Risk for impaired skin integrity will decrease Outcome: Progressing

## 2019-03-04 NOTE — Plan of Care (Signed)
Discussed with patient plan of care for the evening, pain management and importance of resting between each activity with some teach back displayed.  Patient stated he updated his son tonight.

## 2019-03-04 NOTE — Progress Notes (Addendum)
Physical Therapy Treatment Patient Details Name: Alexander Erickson MRN: [id number] DOB: 1955/07/24 Today's Date: 03/04/2019    History of Present Illness Pt is a 63 y.o. male admitted February 25, 2019 with progressive cough and dyspnea, found to be hypoxic with COVID-19 (initial positive 11/30). Pt with RLE DVT, echo suggested IVC clot, CT venogram without clots; pt not stable for CTA to r/o PE. PMH includes HTN, HLD.   PT Comments    Pt slowly progressing with mobility. Requires minA for standing activity, limited by generalized weakness, fatigue and decreased activity tolerance. SpO2 down to 82% on 40L HHFNC at 90% FiO2 + NRB mask. Pt requires prolonged seated rest, recovering to >/88%. Anxious with mobility, especially when SOB, but remains motivated to participate. Hopeful pt can progress for safe d/c home with assist from family and [facility] services.   Note- pt with difficulty/unable to self-prone due to lines   Follow Up Recommendations  Home health PT;Supervision for mobility/OOB(pending progression)     Equipment Recommendations  (TBD)    Recommendations for Other Services       Precautions / Restrictions Precautions Precautions: Fall;Other (comment) Precaution Comments: HHFNC 40L at 90% FiO2 + 15L NRB Restrictions Weight Bearing Restrictions: No    Mobility  Bed Mobility Overal bed mobility: Needs Assistance Bed Mobility: Supine to Sit     Supine to sit: Mod assist;HOB elevated     General bed mobility comments: ModA for UE support to elevate trunk  Transfers Overall transfer level: Needs assistance Equipment used: 1 person hand held assist Transfers: Sit to/from Stand Sit to Stand: Min assist         General transfer comment: Declining use of RW, minA for HHA  Ambulation/Gait Ambulation/Gait assistance: Min Chemical engineer (Feet): 2 Feet Assistive device: 1 person hand held assist Gait Pattern/deviations: Step-to pattern;Trunk flexed Gait velocity: Decreased    General Gait Details: Steps to recliner, reliant on HHA to maintain balance, also reaching towards recliner to support other UE. SpO2 down to 82% on HHFNC+NRB, returning to 92% with prolonged seated rest and deep breathing. Pt declining further mobility wanting even more time to recover   Stairs             Wheelchair Mobility    Modified Rankin (Stroke Patients Only)       Balance Overall balance assessment: Needs assistance   Sitting balance-Leahy Scale: Good Sitting balance - Comments: Prolonged sitting EOB without UE support     Standing balance-Leahy Scale: Poor Standing balance comment: Reliant on HHA to maintain balance                            Cognition Arousal/Alertness: Awake/alert Behavior During Therapy: WFL for tasks assessed/performed;Anxious Overall Cognitive Status: Within Functional Limits for tasks assessed                                 General Comments: Aware of his anxiety with movement/breathing      Exercises      General Comments        Pertinent Vitals/Pain Pain Assessment: Faces Faces Pain Scale: Hurts a little bit Pain Location: Buttocks from laying in bed Pain Descriptors / Indicators: Discomfort Pain Intervention(s): Monitored during session;Repositioned    Home Living                      Prior Function  PT Goals (current goals can now be found in the care plan section) Progress towards PT goals: Progressing toward goals    Frequency    Min 3X/week      PT Plan Current plan remains appropriate    Co-evaluation              AM-PAC PT "6 Clicks" Mobility   Outcome Measure  Help needed turning from your back to your side while in a flat bed without using bedrails?: A Little Help needed moving from lying on your back to sitting on the side of a flat bed without using bedrails?: A Little Help needed moving to and from a bed to a chair (including a wheelchair)?:  A Little Help needed standing up from a chair using your arms (e.g., wheelchair or bedside chair)?: A Little Help needed to walk in hospital room?: A Little Help needed climbing 3-5 steps with a railing? : A Lot 6 Click Score: 17    End of Session Equipment Utilized During Treatment: Oxygen Activity Tolerance: Patient tolerated treatment well;Treatment limited secondary to medical complications (Comment) Patient left: in chair;with call bell/phone within reach Nurse Communication: Mobility status PT Visit Diagnosis: Other abnormalities of gait and mobility (R26.89)     Time: 4967-5916 PT Time Calculation (min) (ACUTE ONLY): 28 min  Charges:  $Therapeutic Activity: 23-37 mins                    Mabeline Caras, PT, DPT Acute Rehabilitation Services  Pager [phone] Office [place] 03/04/2019, 5:18 PM

## 2019-03-04 NOTE — Progress Notes (Signed)
PROGRESS NOTE                                                                                                                                                                                                             Patient Demographics:    Alexander Erickson, is a 63 y.o. male, DOB - September 06, 1955, ZOX:[id number]  Outpatient Primary MD for the patient is Patient, No Pcp Per   Admit date - 02/25/2019   LOS - 10  No chief complaint on file.      Brief Narrative: Patient is a 63 y.o. male with PMHx of HTN, HLD-who presented with 1 week history of progressive cough and shortness of breath-he was found to have acute hypoxemic respiratory failure in the setting of COVID-19 pneumonia along with severe hyponatremia.   Subjective:   Remains essentially the same-requiring both heated high flow and 100% NRB to maintain O2 saturations.  Looks comfortable and not at rest.   Assessment  & Plan :   Acute Hypoxic Resp Failure due to Covid 19 Viral pneumonia and possible concurrent bacterial pneumonia: Remains essentially same for the past few days-on heated high flow and 100% nonrebreather mask.  Has completed a 10-day course of remdesivir on 12/12, completed empiric Rocephin on 12/8.  Remains on Decadron-we will slowly taper over the next few days.    Fever: afebrile  O2 requirements:  SpO2: 92 % O2 Flow Rate (L/min): On both heated high flow and 100% NRB mask. FiO2 (%): 100 %   COVID-19 Labs: Recent Labs    03/02/19 0316 03/03/19 0050 03/04/19 0450  DDIMER 3.66* 2.48* 2.19*  FERRITIN 1,047* 1,111* 1,309*  CRP 0.7 1.9* 1.9*       Component Value Date/Time   BNP 53.0 02/24/2019 1234    No results for input(s): PROCALCITON in the last 168 hours.  Lab Results  Component Value Date   SARSCOV2NAA Detected (A) 02/18/2019     COVID-19 Medications: Decadron 12/4 > Remdesivir 12/4 >  Actemra 12/6  Other  medications: Diuretics:Euvolemic-continue Lasix to maintain negative balance (- 7.2 L so far) Antibiotics: Completed Rocephin x5 days on 12/8-given elevated procalcitonin level Insulin: CBG stable on SSI.  A1c 6.8-May need initiation of metformin on discharge.   Prone/Incentive Spirometry: encouraged patient to lie prone for 3-4 hours at a time for a total of 16  hours a day, and to encourage incentive spirometry use 3-4/hour  DVT Prophylaxis  :  Lovenox -therapeutic dosing  Right lower extremity DVT with possible IVC clot seen on echo: Continue full dose Lovenox.  CT chest did not show a large central pulmonary thrombus.  CT venogram of the chest/abdomen and pelvis did not show any IVC clot,  discussed case with [facility]-Dr. Towanda Malkin at this time recommends continuing anticoagulation without the need for thrombolytics or any other vascular procedures.  .  Hyponatremia: Resolved with diuresis.  Dyslipidemia: Continue statin  Prolonged QTC: Improved-continue to follow periodically.  Avoid QTC prolonging agents, follow potassium and magnesium  Obesity: Estimated body mass index is 28.01 kg/m as calculated from the following:   Height as of this encounter: 5' 5.5" (1.664 m).   Weight as of this encounter: 77.5 kg.    GI prophylaxis: PPI  Consults  :  None  Procedures  :  None  ABG: No results found for: PHART, PCO2ART, PO2ART, HCO3, TCO2, ACIDBASEDEF, O2SAT  Vent Settings: N/a  Condition - Extremely Guarded-very tenuous with risk for further deterioration  Family Communication  : Left a voicemail for son on 12/13-since medically unchanged-we will update on 12/15.  Code Status :  Full Code  Diet :  Diet Order            Diet Carb Modified Fluid consistency: Thin; Room service appropriate? Yes  Diet effective now               Disposition Plan  :  Remain hospitalized-transfer to ICU for close monitoring-and for heated high flow.  Barriers to discharge: Hypoxia requiring O2  supplementation  Antimicorbials  :    Anti-infectives (From admission, onward)   Start     Dose/Rate Route Frequency Ordered Stop   02/27/19 1500  remdesivir 100 mg in sodium chloride 0.9 % 100 mL IVPB    Note to Pharmacy: To complete a total of 10 days of therapy   100 mg 200 mL/hr over 30 Minutes Intravenous Daily 02/27/19 1320 03/03/19 1850   02/23/19 1000  remdesivir 100 mg in sodium chloride 0.9 % 100 mL IVPB  Status:  Discontinued     100 mg 200 mL/hr over 30 Minutes Intravenous Daily 02/25/19 2057 25-Feb-2019 2102   02/23/19 1000  remdesivir 100 mg in sodium chloride 0.9 % 100 mL IVPB     100 mg 200 mL/hr over 30 Minutes Intravenous Daily 25-Feb-2019 2104 02/26/19 0915   February 25, 2019 2315  doxycycline (VIBRA-TABS) tablet 100 mg  Status:  Discontinued     100 mg Oral Every 12 hours 02/25/2019 2306 02/23/19 1657   02/25/2019 2100  remdesivir 200 mg in sodium chloride 0.9% 250 mL IVPB  Status:  Discontinued     200 mg 580 mL/hr over 30 Minutes Intravenous Once 2019-02-25 2057 25-Feb-2019 2102   02-25-2019 2100  cefTRIAXone (ROCEPHIN) 2 g in sodium chloride 0.9 % 100 mL IVPB     2 g 200 mL/hr over 30 Minutes Intravenous Every 24 hours 02/25/19 2058 02/26/19 2215   February 25, 2019 2100  azithromycin (ZITHROMAX) tablet 500 mg  Status:  Discontinued     500 mg Oral Daily 02/25/19 2058 Feb 25, 2019 2306      Inpatient Medications  Scheduled Meds: . Chlorhexidine Gluconate Cloth  6 each Topical Daily  . cholecalciferol  1,000 Units Oral Daily  . dexamethasone (DECADRON) injection  6 mg Intravenous Q24H  . enoxaparin (LOVENOX) injection  1 mg/kg Subcutaneous Q12H  . furosemide  40 mg Intravenous Daily  . insulin aspart  0-9 Units Subcutaneous TID WC  . mouth rinse  15 mL Mouth Rinse BID  . metoprolol tartrate  12.5 mg Oral BID  . pantoprazole  40 mg Oral Daily  . rosuvastatin  20 mg Oral q1800  . sodium chloride flush  3 mL Intravenous Q12H  . vitamin C  500 mg Oral Daily  . zinc sulfate  220 mg Oral Daily     Continuous Infusions:  PRN Meds:.acetaminophen, ALPRAZolam, HYDROcodone-acetaminophen, influenza vac split quadrivalent PF, labetalol, lip balm, phenol, sodium chloride   Time Spent in minutes 35   See all Orders from today for further details   Jeoffrey Massed M.D on 03/04/2019 at 12:54 PM  To page go to [email or web address] - use universal password  [facility] -  Office  [phone]    Objective:   Vitals:   03/04/19 0700 03/04/19 0800 03/04/19 0810 03/04/19 0900  BP:  111/77 111/77   Pulse: 81 85 83   Resp: (!) 24 20 (!) 25   Temp:  (!) 97 F (36.1 C)    TempSrc:  Axillary    SpO2: (!) 89% (!) 89% (!) 89% 90%  Weight:      Height:        Wt Readings from Last 3 Encounters:  03/03/19 77.5 kg  03/19/2019 86.2 kg     Intake/Output Summary (Last 24 hours) at 03/04/2019 1254 Last data filed at 03/04/2019 0559 Gross per 24 hour  Intake 1327 ml  Output 1400 ml  Net -73 ml     Physical Exam Gen Exam:Alert awake-not in any distress HEENT:atraumatic, normocephalic Chest: B/L clear to auscultation anteriorly CVS:S1S2 regular Abdomen:soft non tender, non distended Extremities:no edema Neurology: Non focal Skin: no rash   Data Review:    CBC Recent Labs  Lab 02/26/19 0355 02/27/19 0315 02/28/19 0355 03/01/19 0328 03/02/19 0316 03/03/19 0050 03/04/19 0450  WBC 8.5 9.1 12.7* 14.3* 15.6* 13.2* 13.3*  HGB 17.8* 18.3* 19.6* 18.9* 17.9* 18.0* 18.3*  HCT 52.3* 53.9* 57.0* 55.7* 53.2* 53.0* 54.6*  PLT 178 194 180 171 153 131* 129*  MCV 86.9 86.5 86.8 88.1 88.5 87.6 89.1  MCH 29.6 29.4 29.8 29.9 29.8 29.8 29.9  MCHC 34.0 34.0 34.4 33.9 33.6 34.0 33.5  RDW 12.7 12.6 13.0 12.8 12.8 12.8 13.0  LYMPHSABS 0.7 0.6*  --   --   --   --   --   MONOABS 0.1 0.2  --   --   --   --   --   EOSABS 0.0 0.0  --   --   --   --   --   BASOSABS 0.0 0.0  --   --   --   --   --     Chemistries  Recent Labs  Lab 02/26/19 0355 02/27/19 0315 02/28/19 0355  03/01/19 0328 03/02/19 0316 03/03/19 0050 03/04/19 0450  NA 128* 133* 135 136 133* 136 138  K 4.1 3.8 4.2 4.1 4.4 4.3 4.5  CL 82* 83* 91* 90* 91* 94* 92*  CO2 26 32 34*  GLUCOSE 113* 114* 125* 106* 110* 149* 101*  BUN 35* 41* 49* 46* 40* 41* 40*  CREATININE 0.85 0.87 0.94 0.94 0.93 0.88 0.92  CALCIUM 7.9* 8.6* 8.7* 8.7* 8.4* 8.5* 8.6*  MG 2.5* 2.5* 2.6*  --  2.3  --   --   AST 47* 40 38 39 34 36 37  ALT  36 34 33  ALKPHOS 215* 239* 233* 213* 180* 189* 171*  BILITOT 0.7 1.0 0.8 1.2 1.2 1.1 1.0   ------------------------------------------------------------------------------------------------------------------ No results for input(s): CHOL, HDL, LDLCALC, TRIG, CHOLHDL, LDLDIRECT in the last 72 hours.  Lab Results  Component Value Date   HGBA1C 6.8 (H) 02/27/2019   ------------------------------------------------------------------------------------------------------------------ No results for input(s): TSH, T4TOTAL, T3FREE, THYROIDAB in the last 72 hours.  Invalid input(s): FREET3 ------------------------------------------------------------------------------------------------------------------ Recent Labs    03/03/19 0050 03/04/19 0450  FERRITIN 1,111* 1,309*    Coagulation profile No results for input(s): INR, PROTIME in the last 168 hours.  Recent Labs    03/03/19 0050 03/04/19 0450  DDIMER 2.48* 2.19*    Cardiac Enzymes No results for input(s): CKMB, TROPONINI, MYOGLOBIN in the last 168 hours.  Invalid input(s): CK ------------------------------------------------------------------------------------------------------------------    Component Value Date/Time   BNP 53.0 03/15/2019 1234    Micro Results Recent Results (from the past 240 hour(s))  Culture, sputum-assessment     Status: None   Collection Time: 02/23/19  5:00 AM   Specimen: Sputum  Result Value Ref Range Status   Specimen Description SPUTUM  Final   Special Requests NONE   Final   Sputum evaluation   Final    THIS SPECIMEN IS ACCEPTABLE FOR SPUTUM CULTURE Performed at [facility], [address], [place], [place] [address]    Report Status 02/23/2019 FINAL  Final  Culture, respiratory     Status: None   Collection Time: 02/23/19  5:00 AM   Specimen: SPU  Result Value Ref Range Status   Specimen Description   Final    SPUTUM Performed at [facility], [address] [place], [place] [address]    Special Requests   Final    NONE Reflexed from [id number] Performed at [facility], [address], [place], [place] [address]    Gram Stain   Final    RARE WBC PRESENT, PREDOMINANTLY PMN FEW GRAM POSITIVE COCCI IN CLUSTERS FEW GRAM NEGATIVE RODS FEW GRAM POSITIVE RODS    Culture   Final    FEW Consistent with normal respiratory flora. Performed at [facility], [address], [place], [place] [address]    Report Status 02/25/2019 FINAL  Final  MRSA PCR Screening     Status: None   Collection Time: 02/28/19  4:31 AM   Specimen: Nasal Mucosa; Nasopharyngeal  Result Value Ref Range Status   MRSA by PCR NEGATIVE NEGATIVE Final    Comment:        The GeneXpert MRSA Assay (FDA approved for NASAL specimens only), is one component of a comprehensive MRSA colonization surveillance program. It is not intended to diagnose MRSA infection nor to guide or monitor treatment for MRSA infections. Performed at [facility], [address] [place], [place] [address]     Radiology Reports CT CHEST W CONTRAST  Result Date: 02/28/2019 CLINICAL DATA:  Cardiac thrombus or embolic source suspected. Concern for thrombus within the proximal IVC seen on ECHO. EXAM: CT CHEST, ABDOMEN, AND PELVIS WITH CONTRAST TECHNIQUE: Multidetector CT imaging of the chest, abdomen and pelvis was performed following the standard protocol during bolus administration of intravenous contrast. CONTRAST:   OMNIPAQUE IOHEXOL 350 MG/ML SOLN COMPARISON:  None. FINDINGS: CT CHEST FINDINGS Cardiovascular: There is no large centrally located pulmonary embolism. The heart size is normal. There is no significant pericardial effusion. Coronary artery calcifications are noted. Minimal atherosclerotic changes  are noted of the thoracic aorta without evidence for a thoracic aortic aneurysm. Mediastinum/Nodes: --there are prominent mediastinal and hilar lymph nodes, likely reactive. --No axillary lymphadenopathy. --No supraclavicular lymphadenopathy. --Normal thyroid gland. --The esophagus is unremarkable Lungs/Pleura: There are diffuse bilateral ground-glass airspace opacities involving all lobes to relatively equal degree. There is no pneumothorax. No significant pleural effusion. Musculoskeletal: No chest wall abnormality. No acute or significant osseous findings. Review of the MIP images confirms the above findings. CT ABDOMEN PELVIS FINDINGS Hepatobiliary: The liver is normal. Normal gallbladder.There is no biliary ductal dilation. Pancreas: Normal contours without ductal dilatation. No peripancreatic fluid collection. Spleen: No splenic laceration or hematoma. Adrenals/Urinary Tract: --Adrenal glands: No adrenal hemorrhage. --Right kidney/ureter: No hydronephrosis or perinephric hematoma. --Left kidney/ureter: No hydronephrosis or perinephric hematoma. --Urinary bladder: Unremarkable. Stomach/Bowel: --Stomach/Duodenum: No hiatal hernia or other gastric abnormality. Normal duodenal course and caliber. --Small bowel: No dilatation or inflammation. --Colon: No focal abnormality. --Appendix: Normal. Vascular/Lymphatic: Normal course and caliber of the major abdominal vessels. There is no evidence for a thrombus within the IVC. --No retroperitoneal lymphadenopathy. --No mesenteric lymphadenopathy. --No pelvic or inguinal lymphadenopathy. Reproductive: Unremarkable Other: No ascites or free air. There are bilateral fat  containing inguinal hernias. Musculoskeletal. No acute displaced fractures. IMPRESSION: 1. No CT evidence for IVC thrombus. 2. Diffuse bilateral ground-glass airspace opacities consistent with the patient's history of viral pneumonia. 3. Prominent mediastinal and hilar lymph nodes, likely reactive. Aortic Atherosclerosis (ICD10-I70.0). Electronically Signed   By: Katherine Mantle M.D.   On: 02/28/2019 00:45   DG Chest Port 1 View  Result Date: 02/25/2019 CLINICAL DATA:  63 year old male COVID-19 pneumonia. EXAM: PORTABLE CHEST 1 VIEW COMPARISON:  03-07-19 and earlier. FINDINGS: Portable AP semi upright view at 0648 hours. Lower lung volumes. Stable cardiac size and mediastinal contours. Visualized tracheal air column is within normal limits. Coarse and interstitial bilateral pulmonary opacity. Some improved ventilation at the right lung base. Most confluent opacity now remains at the left lung base. No superimposed pneumothorax or effusion. Paucity of bowel gas in the upper abdomen. No acute osseous abnormality identified. IMPRESSION: 1. Continued bilateral pneumonia with improved ventilation at the right lung base since 07-Mar-2019. 2. No new cardiopulmonary abnormality. Electronically Signed   By: Odessa Fleming M.D.   On: 02/25/2019 07:57   DG Chest Portable 1 View  Result Date: March 07, 2019 CLINICAL DATA:  Shortness of breath, COVID positive EXAM: PORTABLE CHEST 1 VIEW COMPARISON:  None. FINDINGS: Cardiomediastinal contours are normal. There is diffuse interstitial and airspace opacity throughout both the right and left chest. No signs of dense consolidation or evidence of pleural effusion. No acute bone finding. IMPRESSION: Diffuse interstitial and airspace opacity throughout both lungs. Findings which could be seen in viral or atypical pneumonia, also with COVID-19 infection. Asymmetric pulmonary edema could have similar appearance. Electronically Signed   By: Donzetta Kohut M.D.   On: 2019/03/07 13:01    DG Chest Port 1V today  Result Date: 03/02/2019 CLINICAL DATA:  Shortness of breath. Follow-up for COVID-19 pneumonia. EXAM: PORTABLE CHEST 1 VIEW COMPARISON:  02/25/2019 and earlier studies. FINDINGS: Hazy bilateral airspace lung opacities are without significant change from the most recent prior exam. No new lung abnormalities. No evidence of a pleural effusion and no pneumothorax. IMPRESSION: 1. No significant change from the most recent prior study. Persistent bilateral hazy airspace lung opacities consistent with COVID-19 pneumonia, with possible superimposed ARDS. Electronically Signed   By: Amie Portland M.D.   On: 03/02/2019 13:12   ECHOCARDIOGRAM  COMPLETE  Result Date: 02/27/2019   ECHOCARDIOGRAM REPORT   Patient Name:   YOUSOF ALDERMAN Date of Exam: 02/27/2019 Medical Rec #:  [id number]    Height:       65.5 in Accession #:    [id number]   Weight:       187.4 lb Date of Birth:  07-24-1955   BSA:          1.94 m Patient Age:    39 years     BP:           109/71 mmHg Patient Gender: M            HR:           102 bpm. Exam Location:  Inpatient Procedure: 2D Echo, Cardiac Doppler and Color Doppler Indications:    Acute respiratory failure.  History:        Patient has no prior history of Echocardiogram examinations.                 Abnormal ECG. Covid 19 positive. Hypoxia. Pneumonia.  Sonographer:    Roseanna Rainbow RDCS Referring Phys: [place]  Sonographer Comments: Technically difficult study due to poor echo windows. IMPRESSIONS  1. There is a mass like structure present in the IVC. This prevents collpase of the IVC during the examination. Due to the patient's history of covid-19 infection and LE DVT, this likely represents a thrombus. RV size and function are normal.  2. Left ventricular ejection fraction, by visual estimation, is >75%. The left ventricle has hyperdynamic function. There is mildly increased left ventricular hypertrophy.  3. Small left ventricular internal cavity size.  4.  The left ventricle has no regional wall motion abnormalities.  5. Global right ventricle has normal systolic function.The right ventricular size is normal. No increase in right ventricular wall thickness.  6. Left atrial size was normal.  7. Right atrial size was normal.  8. Presence of pericardial fat pad.  9. The pericardial effusion is circumferential. 10. Trivial pericardial effusion is present. 11. The mitral valve is grossly normal. No evidence of mitral valve regurgitation. 12. The tricuspid valve is grossly normal. Tricuspid valve regurgitation is trivial. 13. The aortic valve is grossly normal. Aortic valve regurgitation is not visualized. No evidence of aortic valve sclerosis or stenosis. 14. The pulmonic valve was grossly normal. Pulmonic valve regurgitation is not visualized. 15. TR signal is inadequate for assessing pulmonary artery systolic pressure. 64. 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 17. The inferior vena cava is normal in size with <50% respiratory variability, suggesting right atrial pressure of 8 mmHg. FINDINGS  Left Ventricle: Left ventricular ejection fraction, by visual estimation, is >75%. The left ventricle has hyperdynamic function. The left ventricle has no regional wall motion abnormalities. The left ventricular internal cavity size was the LV cavity size is small. There is mildly increased left ventricular hypertrophy. Concentric left ventricular hypertrophy. Left ventricular diastolic parameters are consistent with age-related delayed relaxation (normal). Right Ventricle: The right ventricular size is normal. No increase in right ventricular wall thickness. Global RV systolic function is has normal systolic function. Left Atrium: Left atrial size was normal in size. Right Atrium: Right atrial size was normal in size Pericardium: Trivial pericardial effusion is present. The pericardial effusion is circumferential. Presence of pericardial fat pad. Mitral Valve: The mitral valve is  grossly normal. No evidence of mitral valve regurgitation. Tricuspid Valve: The tricuspid valve is grossly normal. Tricuspid valve regurgitation is trivial. Aortic  Valve: The aortic valve is grossly normal. Aortic valve regurgitation is not visualized. The aortic valve is structurally normal, with no evidence of sclerosis or stenosis. Pulmonic Valve: The pulmonic valve was grossly normal. Pulmonic valve regurgitation is not visualized. Pulmonic regurgitation is not visualized. Aorta: The aortic root is normal in size and structure. Venous: The inferior vena cava is normal in size with less than 50% respiratory variability, suggesting right atrial pressure of 8 mmHg. There is mass detected in the inferior vena cava. IAS/Shunts: No atrial level shunt detected by color flow Doppler.  LEFT VENTRICLE PLAX 2D LVIDd:         2.67 cm       Diastology LVIDs:         1.94 cm       LV e' lateral:   7.29 cm/s LV PW:         1.58 cm       LV E/e' lateral: 7.6 LV IVS:        1.20 cm       LV e' medial:    5.22 cm/s LVOT diam:     1.60 cm       LV E/e' medial:  10.6 LV SV:         15 ml LV SV Index:   7.22 LVOT Area:     2.01 cm  LV Volumes (MOD) LV area d, A2C:    12.70 cm LV area d, A4C:    17.80 cm LV area s, A2C:    8.18 cm LV area s, A4C:    9.86 cm LV major d, A2C:   4.92 cm LV major d, A4C:   6.84 cm LV major s, A2C:   4.51 cm LV major s, A4C:   5.77 cm LV vol d, MOD A2C: 27.7 ml LV vol d, MOD A4C: 40.2 ml LV vol s, MOD A2C: 12.9 ml LV vol s, MOD A4C: 14.3 ml LV SV MOD A2C:     14.8 ml LV SV MOD A4C:     40.2 ml LV SV MOD BP:      24.0 ml RIGHT VENTRICLE             IVC RV S prime:     22.40 cm/s  IVC diam: 1.53 cm TAPSE (M-mode): 1.7 cm LEFT ATRIUM             Index       RIGHT ATRIUM           Index LA diam:        2.40 cm 1.24 cm/m  RA Area:     13.30 cm LA Vol (A2C):   22.5 ml 11.63 ml/m RA Volume:   34.80 ml  17.98 ml/m LA Vol (A4C):   27.4 ml 14.16 ml/m LA Biplane Vol: 26.4 ml 13.64 ml/m  AORTIC VALVE LVOT  Vmax:   133.00 cm/s LVOT Vmean:  105.000 cm/s LVOT VTI:    0.211 m  AORTA Ao Root diam: 2.90 cm MITRAL VALVE MV Area (PHT): 2.39 cm             SHUNTS MV PHT:        91.93 msec           Systemic VTI:  0.21 m MV Decel Time: 317 msec             Systemic Diam: 1.60 cm MV E velocity: 55.30 cm/s 103 cm/s MV A velocity: 89.20 cm/s 70.3 cm/s MV  E/A ratio:  0.62       1.5  Lennie OdorWesley O'Neal MD Electronically signed by Lennie OdorWesley O'Neal MD Signature Date/Time: 02/27/2019/5:55:17 PM    Final    CT VENOGRAM ABD/PEL  Result Date: 02/28/2019 CLINICAL DATA:  Cardiac thrombus or embolic source suspected. Concern for thrombus within the proximal IVC seen on ECHO. EXAM: CT CHEST, ABDOMEN, AND PELVIS WITH CONTRAST TECHNIQUE: Multidetector CT imaging of the chest, abdomen and pelvis was performed following the standard protocol during bolus administration of intravenous contrast. CONTRAST:  100mL OMNIPAQUE IOHEXOL 350 MG/ML SOLN COMPARISON:  None. FINDINGS: CT CHEST FINDINGS Cardiovascular: There is no large centrally located pulmonary embolism. The heart size is normal. There is no significant pericardial effusion. Coronary artery calcifications are noted. Minimal atherosclerotic changes are noted of the thoracic aorta without evidence for a thoracic aortic aneurysm. Mediastinum/Nodes: --there are prominent mediastinal and hilar lymph nodes, likely reactive. --No axillary lymphadenopathy. --No supraclavicular lymphadenopathy. --Normal thyroid gland. --The esophagus is unremarkable Lungs/Pleura: There are diffuse bilateral ground-glass airspace opacities involving all lobes to relatively equal degree. There is no pneumothorax. No significant pleural effusion. Musculoskeletal: No chest wall abnormality. No acute or significant osseous findings. Review of the MIP images confirms the above findings. CT ABDOMEN PELVIS FINDINGS Hepatobiliary: The liver is normal. Normal gallbladder.There is no biliary ductal dilation. Pancreas: Normal  contours without ductal dilatation. No peripancreatic fluid collection. Spleen: No splenic laceration or hematoma. Adrenals/Urinary Tract: --Adrenal glands: No adrenal hemorrhage. --Right kidney/ureter: No hydronephrosis or perinephric hematoma. --Left kidney/ureter: No hydronephrosis or perinephric hematoma. --Urinary bladder: Unremarkable. Stomach/Bowel: --Stomach/Duodenum: No hiatal hernia or other gastric abnormality. Normal duodenal course and caliber. --Small bowel: No dilatation or inflammation. --Colon: No focal abnormality. --Appendix: Normal. Vascular/Lymphatic: Normal course and caliber of the major abdominal vessels. There is no evidence for a thrombus within the IVC. --No retroperitoneal lymphadenopathy. --No mesenteric lymphadenopathy. --No pelvic or inguinal lymphadenopathy. Reproductive: Unremarkable Other: No ascites or free air. 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 Musculoskeletal. No acute displaced fractures. IMPRESSION: 1. No CT evidence for IVC thrombus. 2. Diffuse bilateral ground-glass airspace opacities consistent with the patient's history of viral pneumonia. 3. Prominent mediastinal and hilar lymph nodes, likely reactive. Aortic Atherosclerosis (ICD10-I70.0). Electronically Signed   By: Katherine Mantlehristopher  Green M.D.   On: 02/28/2019 00:45   VAS US LOWER EXTREMITY VENOUS (DVT)  Result Date: 02/24/2019  Lower Venous Study Indications: Elevated Ddimer.  Risk Factors: COVID 19 positive. Comparison Study: No prior studies. Performing Technologist: Chanda BusingGregory Collins RVT  Examination Guidelines: A complete evaluation includes B-mode imaging, spectral Doppler, color Doppler, and power Doppler as needed of all accessible portions of each vessel. Bilateral testing is considered an integral part of a complete examination. Limited examinations for reoccurring indications may be performed as noted.  +---------+---------------+---------+-----------+----------+--------------+ RIGHT     CompressibilityPhasicitySpontaneityPropertiesThrombus Aging +---------+---------------+---------+-----------+----------+--------------+ CFV      Full           Yes      Yes                                 +---------+---------------+---------+-----------+----------+--------------+ SFJ      Full                                                        +---------+---------------+---------+-----------+----------+--------------+  FV Prox  Full                                                        +---------+---------------+---------+-----------+----------+--------------+ FV Mid   Full                                                        +---------+---------------+---------+-----------+----------+--------------+ FV DistalFull                                                        +---------+---------------+---------+-----------+----------+--------------+ PFV      Full                                                        +---------+---------------+---------+-----------+----------+--------------+ POP      Full           Yes      Yes                                 +---------+---------------+---------+-----------+----------+--------------+ PTV      Partial                                      Acute          +---------+---------------+---------+-----------+----------+--------------+ PERO     Full                                                        +---------+---------------+---------+-----------+----------+--------------+   +---------+---------------+---------+-----------+----------+--------------+ LEFT     CompressibilityPhasicitySpontaneityPropertiesThrombus Aging +---------+---------------+---------+-----------+----------+--------------+ CFV      Full           Yes      Yes                                 +---------+---------------+---------+-----------+----------+--------------+ SFJ      Full                                                         +---------+---------------+---------+-----------+----------+--------------+ FV Prox  Full                                                        +---------+---------------+---------+-----------+----------+--------------+  FV Mid   Full                                                        +---------+---------------+---------+-----------+----------+--------------+ FV DistalFull                                                        +---------+---------------+---------+-----------+----------+--------------+ PFV      Full                                                        +---------+---------------+---------+-----------+----------+--------------+ POP      Full           Yes      Yes                                 +---------+---------------+---------+-----------+----------+--------------+ PTV      Full                                                        +---------+---------------+---------+-----------+----------+--------------+ PERO     Full                                                        +---------+---------------+---------+-----------+----------+--------------+     Summary: Right: Findings consistent with acute deep vein thrombosis involving the right posterior tibial veins. No cystic structure found in the popliteal fossa. Left: There is no evidence of deep vein thrombosis in the lower extremity. No cystic structure found in the popliteal fossa.  *See table(s) above for measurements and observations. Electronically signed by Waverly Ferrari MD on 02/24/2019 at 3:55:02 PM.    Final

## 2019-03-05 LAB — FERRITIN: Ferritin: 1264 ng/mL — ABNORMAL HIGH (ref 24–336)

## 2019-03-05 LAB — COMPREHENSIVE METABOLIC PANEL
ALT: 40 U/L (ref 0–44)
AST: 36 U/L (ref 15–41)
Albumin: 2.6 g/dL — ABNORMAL LOW (ref 3.5–5.0)
Alkaline Phosphatase: 161 U/L — ABNORMAL HIGH (ref 38–126)
Anion gap: 13 (ref 5–15)
BUN: 44 mg/dL — ABNORMAL HIGH (ref 8–23)
CO2: 33 mmol/L — ABNORMAL HIGH (ref 22–32)
Calcium: 8.7 mg/dL — ABNORMAL LOW (ref 8.9–10.3)
Chloride: 89 mmol/L — ABNORMAL LOW (ref 98–111)
Creatinine, Ser: 0.86 mg/dL (ref 0.61–1.24)
GFR calc Af Amer: >60 mL/min (ref >60)
GFR calc non Af Amer: >60 mL/min (ref >60)
Glucose, Bld: 129 mg/dL — ABNORMAL HIGH (ref 70–99)
Potassium: 4.7 mmol/L (ref 3.5–5.1)
Sodium: 135 mmol/L (ref 135–145)
Total Bilirubin: 0.8 mg/dL (ref 0.3–1.2)
Total Protein: 6.8 g/dL (ref 6.5–8.1)

## 2019-03-05 LAB — CBC
HCT: 54.4 % — ABNORMAL HIGH (ref 39.0–52.0)
Hemoglobin: 18.1 g/dL — ABNORMAL HIGH (ref 13.0–17.0)
MCH: 29.5 pg (ref 26.0–34.0)
MCHC: 33.3 g/dL (ref 30.0–36.0)
MCV: 88.7 fL (ref 80.0–100.0)
Platelets: 129 10*3/uL — ABNORMAL LOW (ref 150–400)
RBC: 6.13 MIL/uL — ABNORMAL HIGH (ref 4.22–5.81)
RDW: 13.2 % (ref 11.5–15.5)
WBC: 12.7 10*3/uL — ABNORMAL HIGH (ref 4.0–10.5)
nRBC: 0.2 % (ref 0.0–0.2)

## 2019-03-05 LAB — C-REACTIVE PROTEIN: CRP: 1 mg/dL — ABNORMAL HIGH (ref <1.0)

## 2019-03-05 LAB — D-DIMER, QUANTITATIVE: D-Dimer, Quant: 2.02 ug/mL-FEU — ABNORMAL HIGH (ref 0.00–0.50)

## 2019-03-05 LAB — GLUCOSE, CAPILLARY
Glucose-Capillary: 134 mg/dL — ABNORMAL HIGH (ref 70–99)
Glucose-Capillary: 275 mg/dL — ABNORMAL HIGH (ref 70–99)
Glucose-Capillary: 285 mg/dL — ABNORMAL HIGH (ref 70–99)
Glucose-Capillary: 144 mg/dL — ABNORMAL HIGH (ref 70–99)

## 2019-03-05 MED ORDER — DEXAMETHASONE SODIUM PHOSPHATE 4 MG/ML IJ SOLN
4.0000 mg | INTRAMUSCULAR | Status: DC
  Administered 2019-03-06 – 2019-03-07 (×2): 4 mg via INTRAVENOUS
  Filled 2019-03-05 (×2): qty 1

## 2019-03-05 NOTE — Progress Notes (Signed)
Inpatient Diabetes Program Recommendations  AACE/ADA: New Consensus Statement on Inpatient Glycemic Control (2015)  Target Ranges:  Prepandial:   less than 140 mg/dL      Peak postprandial:   less than 180 mg/dL (1-2 hours)      Critically ill patients:  140 - 180 mg/dL   Lab Results  Component Value Date   GLUCAP 275 (H) 03/05/2019   HGBA1C 6.8 (H) 02/27/2019    Review of Glycemic Control Results for TYCE, DELCID (MRN [id number]) as of 03/05/2019 14:51  Ref. Range 03/04/2019 11:15 03/04/2019 17:51 03/04/2019 20:32 03/05/2019 08:00 03/05/2019 12:02  Glucose-Capillary Latest Ref Range: 70 - 99 mg/dL 176 (H) 303 (H) 218 (H) 134 (H) 275 (H)   Diabetes history: None noted however A1C=6.8% Outpatient Diabetes medications: None Current orders for Inpatient glycemic control:  Decadron 6 mg daily Novolog sensitive tid with meals  Inpatient Diabetes Program Recommendations:    While on Decadron, may consider adding Novolog meal coverage 3 units tid with meals (hold if patient eats less than 50%).   Agree that patient will likely need oral DM medication started at d/c such as metformin.   Thanks  Adah Perl, RN, BC-ADM Inpatient Diabetes Coordinator Pager [phone] ([id number])

## 2019-03-05 NOTE — Progress Notes (Signed)
PROGRESS NOTE                                                                                                                                                                                                             Patient Demographics:    Alexander Erickson, is a 63 y.o. male, DOB - February 04, 1956, WUJ:[id number]  Outpatient Primary MD for the patient is Patient, No Pcp Per   Admit date - 02/25/2019   LOS - 11  No chief complaint on file.      Brief Narrative: Patient is a 63 y.o. male with PMHx of HTN, HLD-who presented with 1 week history of progressive cough and shortness of breath-he was found to have acute hypoxemic respiratory failure in the setting of COVID-19 pneumonia along with severe hyponatremia.   Subjective:   Remains the same on heated high flow and 100% NRB.  When NRB taken off-he easily desaturates down to the 70s.  He appears comfortable on both heated high flow and NRB.   Assessment  & Plan :   Acute Hypoxic Resp Failure due to Covid 19 Viral pneumonia and possible concurrent bacterial pneumonia: Remains unchanged for the past few days-continues to require heated high flow and 100% NRB.  Completed 10-day course of remdesivir on 12/12, completed a course of Rocephin on 12/18.  Remains on Decadron-we will plan for approximately a 2-week course.  Recheck procalcitonin tomorrow.  Suspect will require prolonged hospital stay-probably in the fibrotic stage at this point.  Fever: afebrile  O2 requirements:  SpO2: 92 % O2 Flow Rate (L/min): On both heated high flow and 100% NRB mask. FiO2 (%): 100 %   COVID-19 Labs: Recent Labs    03/03/19 0050 03/04/19 0450 03/05/19 0410  DDIMER 2.48* 2.19* 2.02*  FERRITIN 1,111* 1,309* 1,264*  CRP 1.9* 1.9* 1.0*       Component Value Date/Time   BNP 53.0 03/05/2019 1234    No results for input(s): PROCALCITON in the last 168 hours.  Lab Results  Component Value  Date   SARSCOV2NAA Detected (A) 02/18/2019     COVID-19 Medications: Decadron 12/4 > Remdesivir 12/4 >  Actemra 12/6  Other medications: Diuretics:Euvolemic-continue Lasix to maintain negative balance (- 7.2 L so far) Antibiotics: Completed Rocephin x5 days on 12/8-given elevated procalcitonin level Insulin: CBG stable on SSI.  A1c 6.8-May need  initiation of metformin on discharge.   Prone/Incentive Spirometry: encouraged patient to lie prone for 3-4 hours at a time for a total of 16 hours a day, and to encourage incentive spirometry use 3-4/hour  DVT Prophylaxis  :  Lovenox -therapeutic dosing  Right lower extremity DVT with possible IVC clot seen on echo: Continue full dose Lovenox.  CT chest did not show a large central pulmonary thrombus.  CT venogram of the chest/abdomen and pelvis did not show any IVC clot,  discussed case with [facility]-Dr. Towanda Malkin at this time recommends continuing anticoagulation without the need for thrombolytics or any other vascular procedures.  .  Hyponatremia: Resolved with diuresis.  Dyslipidemia: Continue statin  Prolonged QTC: Improved-continue to follow periodically.  Avoid QTC prolonging agents, follow potassium and magnesium  Obesity: Estimated body mass index is 28.01 kg/m as calculated from the following:   Height as of this encounter: 5' 5.5" (1.664 m).   Weight as of this encounter: 77.5 kg.    GI prophylaxis: PPI  Consults  :  None  Procedures  :  None  ABG: No results found for: PHART, PCO2ART, PO2ART, HCO3, TCO2, ACIDBASEDEF, O2SAT  Vent Settings: N/a  Condition - Extremely Guarded-very tenuous with risk for further deterioration  Family Communication  : Spoke with son on 12/15  Code Status :  Full Code  Diet :  Diet Order            Diet Carb Modified Fluid consistency: Thin; Room service appropriate? Yes  Diet effective now               Disposition Plan  :  Remain hospitalized  Barriers to discharge: Hypoxia  requiring O2 supplementation  Antimicorbials  :    Anti-infectives (From admission, onward)   Start     Dose/Rate Route Frequency Ordered Stop   02/27/19 1500  remdesivir 100 mg in sodium chloride 0.9 % 100 mL IVPB    Note to Pharmacy: To complete a total of 10 days of therapy   100 mg 200 mL/hr over 30 Minutes Intravenous Daily 02/27/19 1320 03/03/19 1850   02/23/19 1000  remdesivir 100 mg in sodium chloride 0.9 % 100 mL IVPB  Status:  Discontinued     100 mg 200 mL/hr over 30 Minutes Intravenous Daily 03/16/2019 2057 02/21/2019 2102   02/23/19 1000  remdesivir 100 mg in sodium chloride 0.9 % 100 mL IVPB     100 mg 200 mL/hr over 30 Minutes Intravenous Daily 03/04/2019 2104 02/26/19 0915   03/21/2019 2315  doxycycline (VIBRA-TABS) tablet 100 mg  Status:  Discontinued     100 mg Oral Every 12 hours 03/14/2019 2306 02/23/19 1657   02/21/2019 2100  remdesivir 200 mg in sodium chloride 0.9% 250 mL IVPB  Status:  Discontinued     200 mg 580 mL/hr over 30 Minutes Intravenous Once 02/21/2019 2057 03/03/2019 2102   02/27/2019 2100  cefTRIAXone (ROCEPHIN) 2 g in sodium chloride 0.9 % 100 mL IVPB     2 g 200 mL/hr over 30 Minutes Intravenous Every 24 hours 03/21/2019 2058 02/26/19 2215   03/07/2019 2100  azithromycin (ZITHROMAX) tablet 500 mg  Status:  Discontinued     500 mg Oral Daily 02/23/2019 2058 02/21/2019 2306      Inpatient Medications  Scheduled Meds: . Chlorhexidine Gluconate Cloth  6 each Topical Daily  . cholecalciferol  1,000 Units Oral Daily  . dexamethasone (DECADRON) injection  6 mg Intravenous Q24H  . enoxaparin (LOVENOX) injection  80 mg Subcutaneous Q12H  . furosemide  40 mg Intravenous Daily  . insulin aspart  0-9 Units Subcutaneous TID WC  . mouth rinse  15 mL Mouth Rinse BID  . metoprolol tartrate  12.5 mg Oral BID  . pantoprazole  40 mg Oral Daily  . rosuvastatin  20 mg Oral q1800  . sodium chloride flush  3 mL Intravenous Q12H  . vitamin C  500 mg Oral Daily  . zinc sulfate  220 mg  Oral Daily   Continuous Infusions:  PRN Meds:.acetaminophen, ALPRAZolam, HYDROcodone-acetaminophen, influenza vac split quadrivalent PF, labetalol, lip balm, phenol, sodium chloride   Time Spent in minutes 35   See all Orders from today for further details   Jeoffrey Massed M.D on 03/05/2019 at 2:13 PM  To page go to [email or web address] - use universal password  [facility] -  Office  [phone]    Objective:   Vitals:   03/05/19 0613 03/05/19 0803 03/05/19 1205 03/05/19 1300  BP:  115/69    Pulse: 79 84  (!) 106  Resp: 16 18  (!) 27  Temp:  98.5 F (36.9 C) 97.6 F (36.4 C)   TempSrc:  Axillary Oral   SpO2: 93% 93%  94%  Weight:      Height:        Wt Readings from Last 3 Encounters:  03/03/19 77.5 kg  01-Mar-2019 86.2 kg     Intake/Output Summary (Last 24 hours) at 03/05/2019 1413 Last data filed at 03/05/2019 0800 Gross per 24 hour  Intake 727 ml  Output 1100 ml  Net -373 ml     Physical Exam Gen Exam:Alert awake-not in any distress HEENT:atraumatic, normocephalic Chest: B/L clear to auscultation anteriorly CVS:S1S2 regular Abdomen:soft non tender, non distended Extremities:no edema Neurology: Non focal Skin: no rash   Data Review:    CBC Recent Labs  Lab 02/27/19 0315 03/01/19 0328 03/02/19 0316 03/03/19 0050 03/04/19 0450 03/05/19 0410  WBC 9.1 14.3* 15.6* 13.2* 13.3* 12.7*  HGB 18.3* 18.9* 17.9* 18.0* 18.3* 18.1*  HCT 53.9* 55.7* 53.2* 53.0* 54.6* 54.4*  PLT 194 171 153 131* 129* 129*  MCV 86.5 88.1 88.5 87.6 89.1 88.7  MCH 29.4 29.9 29.8 29.8 29.9 29.5  MCHC 34.0 33.9 33.6 34.0 33.5 33.3  RDW 12.6 12.8 12.8 12.8 13.0 13.2  LYMPHSABS 0.6*  --   --   --   --   --   MONOABS 0.2  --   --   --   --   --   EOSABS 0.0  --   --   --   --   --   BASOSABS 0.0  --   --   --   --   --     Chemistries  Recent Labs  Lab 02/27/19 0315 02/28/19 0355 03/01/19 0328 03/02/19 0316 03/03/19 0050 03/04/19 0450 03/05/19 0410  NA 133* 135  136 133* 136 138 135  K 3.8 4.2 4.1 4.4 4.3 4.5 4.7  CL 83* 91* 90* 91* 94* 92* 89*  CO2 32 34* 33*  GLUCOSE 114* 125* 106* 110* 149* 101* 129*  BUN 41* 49* 46* 40* 41* 40* 44*  CREATININE 0.87 0.94 0.94 0.93 0.88 0.92 0.86  CALCIUM 8.6* 8.7* 8.7* 8.4* 8.5* 8.6* 8.7*  MG 2.5* 2.6*  --  2.3  --   --   --   AST 40 38 39 34 36 37 36  ALT 34 33  40  ALKPHOS 239* 233* 213* 180* 189* 171* 161*  BILITOT 1.0 0.8 1.2 1.2 1.1 1.0 0.8   ------------------------------------------------------------------------------------------------------------------ No results for input(s): CHOL, HDL, LDLCALC, TRIG, CHOLHDL, LDLDIRECT in the last 72 hours.  Lab Results  Component Value Date   HGBA1C 6.8 (H) 02/27/2019   ------------------------------------------------------------------------------------------------------------------ No results for input(s): TSH, T4TOTAL, T3FREE, THYROIDAB in the last 72 hours.  Invalid input(s): FREET3 ------------------------------------------------------------------------------------------------------------------ Recent Labs    03/04/19 0450 03/05/19 0410  FERRITIN 1,309* 1,264*    Coagulation profile No results for input(s): INR, PROTIME in the last 168 hours.  Recent Labs    03/04/19 0450 03/05/19 0410  DDIMER 2.19* 2.02*    Cardiac Enzymes No results for input(s): CKMB, TROPONINI, MYOGLOBIN in the last 168 hours.  Invalid input(s): CK ------------------------------------------------------------------------------------------------------------------    Component Value Date/Time   BNP 53.0 Feb 28, 2019 1234    Micro Results Recent Results (from the past 240 hour(s))  MRSA PCR Screening     Status: None   Collection Time: 02/28/19  4:31 AM   Specimen: Nasal Mucosa; Nasopharyngeal  Result Value Ref Range Status   MRSA by PCR NEGATIVE NEGATIVE Final    Comment:        The GeneXpert MRSA Assay (FDA approved for NASAL  specimens only), is one component of a comprehensive MRSA colonization surveillance program. It is not intended to diagnose MRSA infection nor to guide or monitor treatment for MRSA infections. Performed at [facility], [address], [place], [place] [address]     Radiology Reports CT CHEST W CONTRAST  Result Date: 02/28/2019 CLINICAL DATA:  Cardiac thrombus or embolic source suspected. Concern for thrombus within the proximal IVC seen on ECHO. EXAM: CT CHEST, ABDOMEN, AND PELVIS WITH CONTRAST TECHNIQUE: Multidetector CT imaging of the chest, abdomen and pelvis was performed following the standard protocol during bolus administration of intravenous contrast. CONTRAST:  OMNIPAQUE IOHEXOL 350 MG/ML SOLN COMPARISON:  None. FINDINGS: CT CHEST FINDINGS Cardiovascular: There is no large centrally located pulmonary embolism. The heart size is normal. There is no significant pericardial effusion. Coronary artery calcifications are noted. Minimal atherosclerotic changes are noted of the thoracic aorta without evidence for a thoracic aortic aneurysm. Mediastinum/Nodes: --there are prominent mediastinal and hilar lymph nodes, likely reactive. --No axillary lymphadenopathy. --No supraclavicular lymphadenopathy. --Normal thyroid gland. --The esophagus is unremarkable Lungs/Pleura: There are diffuse bilateral ground-glass airspace opacities involving all lobes to relatively equal degree. There is no pneumothorax. No significant pleural effusion. Musculoskeletal: No chest wall abnormality. No acute or significant osseous findings. Review of the MIP images confirms the above findings. CT ABDOMEN PELVIS FINDINGS Hepatobiliary: The liver is normal. Normal gallbladder.There is no biliary ductal dilation. Pancreas: Normal contours without ductal dilatation. No peripancreatic fluid collection. Spleen: No splenic laceration or hematoma. Adrenals/Urinary Tract: --Adrenal glands: No adrenal  hemorrhage. --Right kidney/ureter: No hydronephrosis or perinephric hematoma. --Left kidney/ureter: No hydronephrosis or perinephric hematoma. --Urinary bladder: Unremarkable. Stomach/Bowel: --Stomach/Duodenum: No hiatal hernia or other gastric abnormality. Normal duodenal course and caliber. --Small bowel: No dilatation or inflammation. --Colon: No focal abnormality. --Appendix: Normal. Vascular/Lymphatic: Normal course and caliber of the major abdominal vessels. There is no evidence for a thrombus within the IVC. --No retroperitoneal lymphadenopathy. --No mesenteric lymphadenopathy. --No pelvic or inguinal lymphadenopathy. Reproductive: Unremarkable Other: No ascites or free air. 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 Musculoskeletal. No acute displaced fractures. IMPRESSION: 1. No CT evidence for IVC thrombus. 2. Diffuse bilateral ground-glass airspace opacities consistent with the patient's history of  viral pneumonia. 3. Prominent mediastinal and hilar lymph nodes, likely reactive. Aortic Atherosclerosis (ICD10-I70.0). Electronically Signed   By: Katherine Mantle M.D.   On: 02/28/2019 00:45   DG Chest Port 1 View  Result Date: 02/25/2019 CLINICAL DATA:  63 year old male COVID-19 pneumonia. EXAM: PORTABLE CHEST 1 VIEW COMPARISON:  03/12/2019 and earlier. FINDINGS: Portable AP semi upright view at 0648 hours. Lower lung volumes. Stable cardiac size and mediastinal contours. Visualized tracheal air column is within normal limits. Coarse and interstitial bilateral pulmonary opacity. Some improved ventilation at the right lung base. Most confluent opacity now remains at the left lung base. No superimposed pneumothorax or effusion. Paucity of bowel gas in the upper abdomen. No acute osseous abnormality identified. IMPRESSION: 1. Continued bilateral pneumonia with improved ventilation at the right lung base since 03/16/2019. 2. No new cardiopulmonary abnormality. Electronically Signed   By: Odessa Fleming  M.D.   On: 02/25/2019 07:57   DG Chest Portable 1 View  Result Date: 03/20/2019 CLINICAL DATA:  Shortness of breath, COVID positive EXAM: PORTABLE CHEST 1 VIEW COMPARISON:  None. FINDINGS: Cardiomediastinal contours are normal. There is diffuse interstitial and airspace opacity throughout both the right and left chest. No signs of dense consolidation or evidence of pleural effusion. No acute bone finding. IMPRESSION: Diffuse interstitial and airspace opacity throughout both lungs. Findings which could be seen in viral or atypical pneumonia, also with COVID-19 infection. Asymmetric pulmonary edema could have similar appearance. Electronically Signed   By: Donzetta Kohut M.D.   On: 03/07/2019 13:01   DG Chest Port 1V today  Result Date: 03/02/2019 CLINICAL DATA:  Shortness of breath. Follow-up for COVID-19 pneumonia. EXAM: PORTABLE CHEST 1 VIEW COMPARISON:  02/25/2019 and earlier studies. FINDINGS: Hazy bilateral airspace lung opacities are without significant change from the most recent prior exam. No new lung abnormalities. No evidence of a pleural effusion and no pneumothorax. IMPRESSION: 1. No significant change from the most recent prior study. Persistent bilateral hazy airspace lung opacities consistent with COVID-19 pneumonia, with possible superimposed ARDS. Electronically Signed   By: Amie Portland M.D.   On: 03/02/2019 13:12   ECHOCARDIOGRAM COMPLETE  Result Date: 02/27/2019   ECHOCARDIOGRAM REPORT   Patient Name:   Alexander Erickson Date of Exam: 02/27/2019 Medical Rec #:  [id number]    Height:       65.5 in Accession #:    [id number]   Weight:       187.4 lb Date of Birth:  10-26-1955   BSA:          1.94 m Patient Age:    63 years     BP:           109/71 mmHg Patient Gender: M            HR:           102 bpm. Exam Location:  Inpatient Procedure: 2D Echo, Cardiac Doppler and Color Doppler Indications:    Acute respiratory failure.  History:        Patient has no prior history of Echocardiogram  examinations.                 Abnormal ECG. Covid 19 positive. Hypoxia. Pneumonia.  Sonographer:    Sheralyn Boatman RDCS Referring Phys: [id number] [facility]  Sonographer Comments: Technically difficult study due to poor echo windows. IMPRESSIONS  1. There is a mass like structure present in the IVC. This prevents collpase of the IVC during the examination. Due to  the patient's history of covid-19 infection and LE DVT, this likely represents a thrombus. RV size and function are normal.  2. Left ventricular ejection fraction, by visual estimation, is >75%. The left ventricle has hyperdynamic function. There is mildly increased left ventricular hypertrophy.  3. Small left ventricular internal cavity size.  4. The left ventricle has no regional wall motion abnormalities.  5. Global right ventricle has normal systolic function.The right ventricular size is normal. No increase in right ventricular wall thickness.  6. Left atrial size was normal.  7. Right atrial size was normal.  8. Presence of pericardial fat pad.  9. The pericardial effusion is circumferential. 10. Trivial pericardial effusion is present. 11. The mitral valve is grossly normal. No evidence of mitral valve regurgitation. 12. The tricuspid valve is grossly normal. Tricuspid valve regurgitation is trivial. 13. The aortic valve is grossly normal. Aortic valve regurgitation is not visualized. No evidence of aortic valve sclerosis or stenosis. 14. The pulmonic valve was grossly normal. Pulmonic valve regurgitation is not visualized. 15. TR signal is inadequate for assessing pulmonary artery systolic pressure. 16. 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 17. The inferior vena cava is normal in size with <50% respiratory variability, suggesting right atrial pressure of 8 mmHg. FINDINGS  Left Ventricle: Left ventricular ejection fraction, by visual estimation, is >75%. The left ventricle has hyperdynamic function. The left ventricle has no regional wall motion  abnormalities. The left ventricular internal cavity size was the LV cavity size is small. There is mildly increased left ventricular hypertrophy. Concentric left ventricular hypertrophy. Left ventricular diastolic parameters are consistent with age-related delayed relaxation (normal). Right Ventricle: The right ventricular size is normal. No increase in right ventricular wall thickness. Global RV systolic function is has normal systolic function. Left Atrium: Left atrial size was normal in size. Right Atrium: Right atrial size was normal in size Pericardium: Trivial pericardial effusion is present. The pericardial effusion is circumferential. Presence of pericardial fat pad. Mitral Valve: The mitral valve is grossly normal. No evidence of mitral valve regurgitation. Tricuspid Valve: The tricuspid valve is grossly normal. Tricuspid valve regurgitation is trivial. Aortic Valve: The aortic valve is grossly normal. Aortic valve regurgitation is not visualized. The aortic valve is structurally normal, with no evidence of sclerosis or stenosis. Pulmonic Valve: The pulmonic valve was grossly normal. Pulmonic valve regurgitation is not visualized. Pulmonic regurgitation is not visualized. Aorta: The aortic root is normal in size and structure. Venous: The inferior vena cava is normal in size with less than 50% respiratory variability, suggesting right atrial pressure of 8 mmHg. There is mass detected in the inferior vena cava. IAS/Shunts: No atrial level shunt detected by color flow Doppler.  LEFT VENTRICLE PLAX 2D LVIDd:         2.67 cm       Diastology LVIDs:         1.94 cm       LV e' lateral:   7.29 cm/s LV PW:         1.58 cm       LV E/e' lateral: 7.6 LV IVS:        1.20 cm       LV e' medial:    5.22 cm/s LVOT diam:     1.60 cm       LV E/e' medial:  10.6 LV SV:         15 ml LV SV Index:   7.22 LVOT Area:     2.01  cm  LV Volumes (MOD) LV area d, A2C:    12.70 cm LV area d, A4C:    17.80 cm LV area s, A2C:     8.18 cm LV area s, A4C:    9.86 cm LV major d, A2C:   4.92 cm LV major d, A4C:   6.84 cm LV major s, A2C:   4.51 cm LV major s, A4C:   5.77 cm LV vol d, MOD A2C: 27.7 ml LV vol d, MOD A4C: 40.2 ml LV vol s, MOD A2C: 12.9 ml LV vol s, MOD A4C: 14.3 ml LV SV MOD A2C:     14.8 ml LV SV MOD A4C:     40.2 ml LV SV MOD BP:      24.0 ml RIGHT VENTRICLE             IVC RV S prime:     22.40 cm/s  IVC diam: 1.53 cm TAPSE (M-mode): 1.7 cm LEFT ATRIUM             Index       RIGHT ATRIUM           Index LA diam:        2.40 cm 1.24 cm/m  RA Area:     13.30 cm LA Vol (A2C):   22.5 ml 11.63 ml/m RA Volume:   34.80 ml  17.98 ml/m LA Vol (A4C):   27.4 ml 14.16 ml/m LA Biplane Vol: 26.4 ml 13.64 ml/m  AORTIC VALVE LVOT Vmax:   133.00 cm/s LVOT Vmean:  105.000 cm/s LVOT VTI:    0.211 m  AORTA Ao Root diam: 2.90 cm MITRAL VALVE MV Area (PHT): 2.39 cm             SHUNTS MV PHT:        91.93 msec           Systemic VTI:  0.21 m MV Decel Time: 317 msec             Systemic Diam: 1.60 cm MV E velocity: 55.30 cm/s 103 cm/s MV A velocity: 89.20 cm/s 70.3 cm/s MV E/A ratio:  0.62       1.5  Lennie Odor MD Electronically signed by Lennie Odor MD Signature Date/Time: 02/27/2019/5:55:17 PM    Final    CT VENOGRAM ABD/PEL  Result Date: 02/28/2019 CLINICAL DATA:  Cardiac thrombus or embolic source suspected. Concern for thrombus within the proximal IVC seen on ECHO. EXAM: CT CHEST, ABDOMEN, AND PELVIS WITH CONTRAST TECHNIQUE: Multidetector CT imaging of the chest, abdomen and pelvis was performed following the standard protocol during bolus administration of intravenous contrast. CONTRAST:  OMNIPAQUE IOHEXOL 350 MG/ML SOLN COMPARISON:  None. FINDINGS: CT CHEST FINDINGS Cardiovascular: There is no large centrally located pulmonary embolism. The heart size is normal. There is no significant pericardial effusion. Coronary artery calcifications are noted. Minimal atherosclerotic changes are noted of the thoracic aorta without  evidence for a thoracic aortic aneurysm. Mediastinum/Nodes: --there are prominent mediastinal and hilar lymph nodes, likely reactive. --No axillary lymphadenopathy. --No supraclavicular lymphadenopathy. --Normal thyroid gland. --The esophagus is unremarkable Lungs/Pleura: There are diffuse bilateral ground-glass airspace opacities involving all lobes to relatively equal degree. There is no pneumothorax. No significant pleural effusion. Musculoskeletal: No chest wall abnormality. No acute or significant osseous findings. Review of the MIP images confirms the above findings. CT ABDOMEN PELVIS FINDINGS Hepatobiliary: The liver is normal. Normal gallbladder.There is no biliary ductal dilation. Pancreas: Normal contours without ductal dilatation.  No peripancreatic fluid collection. Spleen: No splenic laceration or hematoma. Adrenals/Urinary Tract: --Adrenal glands: No adrenal hemorrhage. --Right kidney/ureter: No hydronephrosis or perinephric hematoma. --Left kidney/ureter: No hydronephrosis or perinephric hematoma. --Urinary bladder: Unremarkable. Stomach/Bowel: --Stomach/Duodenum: No hiatal hernia or other gastric abnormality. Normal duodenal course and caliber. --Small bowel: No dilatation or inflammation. --Colon: No focal abnormality. --Appendix: Normal. Vascular/Lymphatic: Normal course and caliber of the major abdominal vessels. There is no evidence for a thrombus within the IVC. --No retroperitoneal lymphadenopathy. --No mesenteric lymphadenopathy. --No pelvic or inguinal lymphadenopathy. Reproductive: Unremarkable Other: No ascites or free air. 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 Musculoskeletal. No acute displaced fractures. IMPRESSION: 1. No CT evidence for IVC thrombus. 2. Diffuse bilateral ground-glass airspace opacities consistent with the patient's history of viral pneumonia. 3. Prominent mediastinal and hilar lymph nodes, likely reactive. Aortic Atherosclerosis (ICD10-I70.0).  Electronically Signed   By: Katherine Mantle M.D.   On: 02/28/2019 00:45   VAS [place] LOWER EXTREMITY VENOUS (DVT)  Result Date: 02/24/2019  Lower Venous Study Indications: Elevated Ddimer.  Risk Factors: COVID 19 positive. Comparison Study: No prior studies. Performing Technologist: Chanda Busing RVT  Examination Guidelines: A complete evaluation includes B-mode imaging, spectral Doppler, color Doppler, and power Doppler as needed of all accessible portions of each vessel. Bilateral testing is considered an integral part of a complete examination. Limited examinations for reoccurring indications may be performed as noted.  +---------+---------------+---------+-----------+----------+--------------+ RIGHT    CompressibilityPhasicitySpontaneityPropertiesThrombus Aging +---------+---------------+---------+-----------+----------+--------------+ CFV      Full           Yes      Yes                                 +---------+---------------+---------+-----------+----------+--------------+ SFJ      Full                                                        +---------+---------------+---------+-----------+----------+--------------+ FV Prox  Full                                                        +---------+---------------+---------+-----------+----------+--------------+ FV Mid   Full                                                        +---------+---------------+---------+-----------+----------+--------------+ FV DistalFull                                                        +---------+---------------+---------+-----------+----------+--------------+ PFV      Full                                                        +---------+---------------+---------+-----------+----------+--------------+  POP      Full           Yes      Yes                                 +---------+---------------+---------+-----------+----------+--------------+ PTV      Partial                                       Acute          +---------+---------------+---------+-----------+----------+--------------+ PERO     Full                                                        +---------+---------------+---------+-----------+----------+--------------+   +---------+---------------+---------+-----------+----------+--------------+ LEFT     CompressibilityPhasicitySpontaneityPropertiesThrombus Aging +---------+---------------+---------+-----------+----------+--------------+ CFV      Full           Yes      Yes                                 +---------+---------------+---------+-----------+----------+--------------+ SFJ      Full                                                        +---------+---------------+---------+-----------+----------+--------------+ FV Prox  Full                                                        +---------+---------------+---------+-----------+----------+--------------+ FV Mid   Full                                                        +---------+---------------+---------+-----------+----------+--------------+ FV DistalFull                                                        +---------+---------------+---------+-----------+----------+--------------+ PFV      Full                                                        +---------+---------------+---------+-----------+----------+--------------+ POP      Full           Yes      Yes                                 +---------+---------------+---------+-----------+----------+--------------+  PTV      Full                                                        +---------+---------------+---------+-----------+----------+--------------+ PERO     Full                                                        +---------+---------------+---------+-----------+----------+--------------+     Summary: Right: Findings consistent with acute deep vein  thrombosis involving the right posterior tibial veins. No cystic structure found in the popliteal fossa. Left: There is no evidence of deep vein thrombosis in the lower extremity. No cystic structure found in the popliteal fossa.  *See table(s) above for measurements and observations. Electronically signed by Waverly Ferrari MD on 02/24/2019 at 3:55:02 PM.    Final

## 2019-03-06 ENCOUNTER — Other Ambulatory Visit: Payer: Self-pay

## 2019-03-06 ENCOUNTER — Other Ambulatory Visit (HOSPITAL_BASED_OUTPATIENT_CLINIC_OR_DEPARTMENT_OTHER): Payer: Self-pay

## 2019-03-06 LAB — POCT I-STAT 7, (LYTES, BLD GAS, ICA,H+H)
Acid-Base Excess: 11 mmol/L — ABNORMAL HIGH (ref 0.0–2.0)
Bicarbonate: 33.6 mmol/L — ABNORMAL HIGH (ref 20.0–28.0)
Calcium, Ion: 1.1 mmol/L — ABNORMAL LOW (ref 1.15–1.40)
HCT: 56 % — ABNORMAL HIGH (ref 39.0–52.0)
Hemoglobin: 19 g/dL — ABNORMAL HIGH (ref 13.0–17.0)
O2 Saturation: 86 %
Patient temperature: 97.6
Potassium: 4.1 mmol/L (ref 3.5–5.1)
Sodium: 130 mmol/L — ABNORMAL LOW (ref 135–145)
TCO2: 35 mmol/L — ABNORMAL HIGH (ref 22–32)
pCO2 arterial: 34.3 mmHg (ref 32.0–48.0)
pH, Arterial: 7.597 — ABNORMAL HIGH (ref 7.350–7.450)
pO2, Arterial: 41 mmHg — ABNORMAL LOW (ref 83.0–108.0)

## 2019-03-06 LAB — CBC
HCT: 54.2 % — ABNORMAL HIGH (ref 39.0–52.0)
Hemoglobin: 18.3 g/dL — ABNORMAL HIGH (ref 13.0–17.0)
MCH: 29.9 pg (ref 26.0–34.0)
MCHC: 33.8 g/dL (ref 30.0–36.0)
MCV: 88.6 fL (ref 80.0–100.0)
Platelets: 133 10*3/uL — ABNORMAL LOW (ref 150–400)
RBC: 6.12 MIL/uL — ABNORMAL HIGH (ref 4.22–5.81)
RDW: 13.1 % (ref 11.5–15.5)
WBC: 13.7 10*3/uL — ABNORMAL HIGH (ref 4.0–10.5)
nRBC: 0.2 % (ref 0.0–0.2)

## 2019-03-06 LAB — PROCALCITONIN: Procalcitonin: 0.13 ng/mL

## 2019-03-06 LAB — GLUCOSE, CAPILLARY
Glucose-Capillary: 145 mg/dL — ABNORMAL HIGH (ref 70–99)
Glucose-Capillary: 166 mg/dL — ABNORMAL HIGH (ref 70–99)
Glucose-Capillary: 282 mg/dL — ABNORMAL HIGH (ref 70–99)
Glucose-Capillary: 152 mg/dL — ABNORMAL HIGH (ref 70–99)

## 2019-03-06 LAB — COMPREHENSIVE METABOLIC PANEL
ALT: 40 U/L (ref 0–44)
AST: 34 U/L (ref 15–41)
Albumin: 2.7 g/dL — ABNORMAL LOW (ref 3.5–5.0)
Alkaline Phosphatase: 153 U/L — ABNORMAL HIGH (ref 38–126)
Anion gap: 17 — ABNORMAL HIGH (ref 5–15)
BUN: 44 mg/dL — ABNORMAL HIGH (ref 8–23)
CO2: 30 mmol/L (ref 22–32)
Calcium: 8.5 mg/dL — ABNORMAL LOW (ref 8.9–10.3)
Chloride: 89 mmol/L — ABNORMAL LOW (ref 98–111)
Creatinine, Ser: 0.97 mg/dL (ref 0.61–1.24)
GFR calc Af Amer: >60 mL/min (ref >60)
GFR calc non Af Amer: >60 mL/min (ref >60)
Glucose, Bld: 129 mg/dL — ABNORMAL HIGH (ref 70–99)
Potassium: 4.1 mmol/L (ref 3.5–5.1)
Sodium: 136 mmol/L (ref 135–145)
Total Bilirubin: 1 mg/dL (ref 0.3–1.2)
Total Protein: 6.9 g/dL (ref 6.5–8.1)

## 2019-03-06 LAB — D-DIMER, QUANTITATIVE: D-Dimer, Quant: 2.24 ug/mL-FEU — ABNORMAL HIGH (ref 0.00–0.50)

## 2019-03-06 LAB — FERRITIN: Ferritin: 1252 ng/mL — ABNORMAL HIGH (ref 24–336)

## 2019-03-06 LAB — C-REACTIVE PROTEIN: CRP: 0.6 mg/dL (ref <1.0)

## 2019-03-06 LAB — MAGNESIUM: Magnesium: 2.3 mg/dL (ref 1.7–2.4)

## 2019-03-06 MED ORDER — ALBUTEROL SULFATE HFA 108 (90 BASE) MCG/ACT IN AERS
2.0000 | INHALATION_SPRAY | RESPIRATORY_TRACT | Status: DC | PRN
  Administered 2019-03-08: 04:00:00 2 via RESPIRATORY_TRACT
  Filled 2019-03-06: qty 6.7

## 2019-03-06 MED ORDER — OXYMETAZOLINE HCL 0.05 % NA SOLN
2.0000 | Freq: Two times a day (BID) | NASAL | Status: DC | PRN
  Filled 2019-03-06: qty 15

## 2019-03-06 NOTE — Progress Notes (Addendum)
PROGRESS NOTE                                                                                                                                                                                                             Patient Demographics:    Alexander Erickson, is a 63 y.o. male, DOB - Jul 23, 1955, ZOX:[id number]  Outpatient Primary MD for the patient is Patient, No Pcp Per   Admit date - 03/10/19   LOS - 12  No chief complaint on file.      Brief Narrative: Patient is a 63 y.o. male with PMHx of HTN, HLD-who presented with 1 week history of progressive cough and shortness of breath-he was found to have acute hypoxemic respiratory failure in the setting of COVID-19 pneumonia along with severe hyponatremia.   Subjective:   Remains essentially the same-requiring both 100% NRB and heated high flow.  With minimal exertion-he continues to desaturate-takes 10 to 15 minutes to regain his O2 saturation.  He remains essentially the same for the past numerous days.   Assessment  & Plan :   Acute Hypoxic Resp Failure due to Covid 19 Viral pneumonia and possible concurrent bacterial pneumonia: Remains unchanged since admission-remains on heated high flow and 100% NRB.  Continue steroids-we will plan on a 2-week course.  Has completed a course of remdesivir.  Repeat procalcitonin  negative.  Continue close monitoring-suspect he is in the fibrotic stage-and will require just supportive care at this point.  If he deteriorates-he will need to be moved to the ICU for intubation.  Will repeat x-ray tomorrow to see if there has been any radiological improvement.  Fever: afebrile  O2 requirements:  SpO2: 92 % O2 Flow Rate (L/min): On both heated high flow and 100% NRB mask. FiO2 (%): 100 %   COVID-19 Labs: Recent Labs    03/04/19 0450 03/05/19 0410 03/06/19 0110  DDIMER 2.19* 2.02* 2.24*  FERRITIN 1,309* 1,264* 1,252*  CRP 1.9* 1.0* 0.6        Component Value Date/Time   BNP 53.0 Mar 10, 2019 1234    Recent Labs  Lab 03/06/19 0110  PROCALCITON 0.13    Lab Results  Component Value Date   SARSCOV2NAA Detected (A) 02/18/2019     COVID-19 Medications: Decadron 12/4 > Remdesivir 12/4 > 12/13 Actemra 12/6  Other medications: Diuretics:Euvolemic-continue Lasix to maintain  negative balance (- 7.2 L so far) Antibiotics: Completed Rocephin x5 days on 12/8-given elevated procalcitonin level Insulin: CBG stable on SSI.  A1c 6.8-May need initiation of metformin on discharge.   Prone/Incentive Spirometry: encouraged patient to lie prone for 3-4 hours at a time for a total of 16 hours a day, and to encourage incentive spirometry use 3-4/hour  DVT Prophylaxis  :  Lovenox -therapeutic dosing  Right lower extremity DVT with possible IVC clot seen on echo: Continue full dose Lovenox.  CT chest did not show a large central pulmonary thrombus.  CT venogram of the chest/abdomen and pelvis did not show any IVC clot,  discussed case with [facility]-Dr. Towanda Malkinain-who at this time recommends continuing anticoagulation without the need for thrombolytics or any other vascular procedures.    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  Hyponatremia: Resolved with diuresis.  Dyslipidemia: Continue statin.  Prolonged QTC: Resolved.  Obesity: Estimated body mass index is 28.01 kg/m as calculated from the following:   Height as of this encounter: 5' 5.5" (1.664 m).   Weight as of this encounter: 77.5 kg.    GI prophylaxis: PPI  Consults  :  None  Procedures  :  None  ABG: No results found for: PHART, PCO2ART, PO2ART, HCO3, TCO2, ACIDBASEDEF, O2SAT  Vent Settings: N/a  Condition - Extremely Guarded-very tenuous with risk for further deterioration  Family Communication  : Spoke with son on 12/15-we will update him on 12/17-as no major changes overnight.  Code Status :  Full Code  Diet :  Diet Order              Diet Carb Modified Fluid consistency: Thin; Room service appropriate? Yes  Diet effective now               Disposition Plan  :  Remain hospitalized  Barriers to discharge: Hypoxia requiring O2 supplementation  Antimicorbials  :    Anti-infectives (From admission, onward)   Start     Dose/Rate Route Frequency Ordered Stop   02/27/19 1500  remdesivir 100 mg in sodium chloride 0.9 % 100 mL IVPB    Note to Pharmacy: To complete a total of 10 days of therapy   100 mg 200 mL/hr over 30 Minutes Intravenous Daily 02/27/19 1320 03/03/19 1850   02/23/19 1000  remdesivir 100 mg in sodium chloride 0.9 % 100 mL IVPB  Status:  Discontinued     100 mg 200 mL/hr over 30 Minutes Intravenous Daily 06-Feb-2019 2057 06-Feb-2019 2102   02/23/19 1000  remdesivir 100 mg in sodium chloride 0.9 % 100 mL IVPB     100 mg 200 mL/hr over 30 Minutes Intravenous Daily 06-Feb-2019 2104 02/26/19 0915   06-Feb-2019 2315  doxycycline (VIBRA-TABS) tablet 100 mg  Status:  Discontinued     100 mg Oral Every 12 hours 06-Feb-2019 2306 02/23/19 1657   06-Feb-2019 2100  remdesivir 200 mg in sodium chloride 0.9% 250 mL IVPB  Status:  Discontinued     200 mg 580 mL/hr over 30 Minutes Intravenous Once 06-Feb-2019 2057 06-Feb-2019 2102   06-Feb-2019 2100  cefTRIAXone (ROCEPHIN) 2 g in sodium chloride 0.9 % 100 mL IVPB     2 g 200 mL/hr over 30 Minutes Intravenous Every 24 hours 06-Feb-2019 2058 02/26/19 2215   06-Feb-2019 2100  azithromycin (ZITHROMAX) tablet 500 mg  Status:  Discontinued     500 mg Oral Daily 06-Feb-2019 2058 06-Feb-2019 2306      Inpatient Medications  Scheduled Meds: .  Chlorhexidine Gluconate Cloth  6 each Topical Daily  . cholecalciferol  1,000 Units Oral Daily  . dexamethasone (DECADRON) injection  4 mg Intravenous Q24H  . enoxaparin (LOVENOX) injection  80 mg Subcutaneous Q12H  . furosemide  40 mg Intravenous Daily  . insulin aspart  0-9 Units Subcutaneous TID WC  . mouth rinse  15 mL Mouth Rinse BID  . metoprolol  tartrate  12.5 mg Oral BID  . pantoprazole  40 mg Oral Daily  . rosuvastatin  20 mg Oral q1800  . sodium chloride flush  3 mL Intravenous Q12H  . vitamin C  500 mg Oral Daily  . zinc sulfate  220 mg Oral Daily   Continuous Infusions:  PRN Meds:.acetaminophen, ALPRAZolam, HYDROcodone-acetaminophen, influenza vac split quadrivalent PF, labetalol, lip balm, oxymetazoline, phenol, sodium chloride   Time Spent in minutes 35   See all Orders from today for further details   Jeoffrey Massed M.D on 03/06/2019 at 4:22 PM  To page go to [email or web address] - use universal password  [facility] -  Office  [phone]    Objective:   Vitals:   03/06/19 1100 03/06/19 1200 03/06/19 1300 03/06/19 1400  BP:  110/70    Pulse: 87 92 89 88  Resp: (!) 22 (!) Temp:  (!) 97 F (36.1 C)    TempSrc:  Axillary    SpO2: 96% 91% (!) 88% (!) 84%  Weight:      Height:        Wt Readings from Last 3 Encounters:  03/03/19 77.5 kg  03/13/2019 86.2 kg     Intake/Output Summary (Last 24 hours) at 03/06/2019 1622 Last data filed at 03/06/2019 1044 Gross per 24 hour  Intake 800 ml  Output 1850 ml  Net -1050 ml     Physical Exam Gen Exam:Alert awake-not in any distress HEENT:atraumatic, normocephalic Chest: B/L clear to auscultation anteriorly CVS:S1S2 regular Abdomen:soft non tender, non distended Extremities:no edema Neurology: Non focal Skin: no rash   Data Review:    CBC Recent Labs  Lab 03/02/19 0316 03/03/19 0050 03/04/19 0450 03/05/19 0410 03/06/19 0110  WBC 15.6* 13.2* 13.3* 12.7* 13.7*  HGB 17.9* 18.0* 18.3* 18.1* 18.3*  HCT 53.2* 53.0* 54.6* 54.4* 54.2*  PLT 153 131* 129* 129* 133*  MCV 88.5 87.6 89.1 88.7 88.6  MCH 29.8 29.8 29.9 29.5 29.9  MCHC 33.6 34.0 33.5 33.3 33.8  RDW 12.8 12.8 13.0 13.2 13.1    Chemistries  Recent Labs  Lab 02/28/19 0355 03/02/19 0316 03/03/19 0050 03/04/19 0450 03/05/19 0410 03/06/19 0110  NA 135 133* 136 138 135  136  K 4.2 4.4 4.3 4.5 4.7 4.1  CL 91* 91* 94* 92* 89* 89*  CO2 34* 33* 30  GLUCOSE 125* 110* 149* 101* 129* 129*  BUN 49* 40* 41* 40* 44* 44*  CREATININE 0.94 0.93 0.88 0.92 0.86 0.97  CALCIUM 8.7* 8.4* 8.5* 8.6* 8.7* 8.5*  MG 2.6* 2.3  --   --   --  2.3  AST 38 34 36 37 36 34  ALT 33 40 40  ALKPHOS 233* 180* 189* 171* 161* 153*  BILITOT 0.8 1.2 1.1 1.0 0.8 1.0   ------------------------------------------------------------------------------------------------------------------ No results for input(s): CHOL, HDL, LDLCALC, TRIG, CHOLHDL, LDLDIRECT in the last 72 hours.  Lab Results  Component Value Date   HGBA1C 6.8 (H) 02/27/2019   ------------------------------------------------------------------------------------------------------------------ No results for input(s): TSH, T4TOTAL, T3FREE, THYROIDAB in the last 72 hours.  Invalid input(s): FREET3 ------------------------------------------------------------------------------------------------------------------ Recent Labs    03/05/19 0410 03/06/19 0110  FERRITIN 1,264* 1,252*    Coagulation profile No results for input(s): INR, PROTIME in the last 168 hours.  Recent Labs    03/05/19 0410 03/06/19 0110  DDIMER 2.02* 2.24*    Cardiac Enzymes No results for input(s): CKMB, TROPONINI, MYOGLOBIN in the last 168 hours.  Invalid input(s): CK ------------------------------------------------------------------------------------------------------------------    Component Value Date/Time   BNP 53.0 Mar 09, 2019 1234    Micro Results Recent Results (from the past 240 hour(s))  MRSA PCR Screening     Status: None   Collection Time: 02/28/19  4:31 AM   Specimen: Nasal Mucosa; Nasopharyngeal  Result Value Ref Range Status   MRSA by PCR NEGATIVE NEGATIVE Final    Comment:        The GeneXpert MRSA Assay (FDA approved for NASAL specimens only), is one component of a comprehensive MRSA  colonization surveillance program. It is not intended to diagnose MRSA infection nor to guide or monitor treatment for MRSA infections. Performed at [facility], [address] [place], [place] [address]     Radiology Reports CT CHEST W CONTRAST  Result Date: 02/28/2019 CLINICAL DATA:  Cardiac thrombus or embolic source suspected. Concern for thrombus within the proximal IVC seen on ECHO. EXAM: CT CHEST, ABDOMEN, AND PELVIS WITH CONTRAST TECHNIQUE: Multidetector CT imaging of the chest, abdomen and pelvis was performed following the standard protocol during bolus administration of intravenous contrast. CONTRAST:  OMNIPAQUE IOHEXOL 350 MG/ML SOLN COMPARISON:  None. FINDINGS: CT CHEST FINDINGS Cardiovascular: There is no large centrally located pulmonary embolism. The heart size is normal. There is no significant pericardial effusion. Coronary artery calcifications are noted. Minimal atherosclerotic changes are noted of the thoracic aorta without evidence for a thoracic aortic aneurysm. Mediastinum/Nodes: --there are prominent mediastinal and hilar lymph nodes, likely reactive. --No axillary lymphadenopathy. --No supraclavicular lymphadenopathy. --Normal thyroid gland. --The esophagus is unremarkable Lungs/Pleura: There are diffuse bilateral ground-glass airspace opacities involving all lobes to relatively equal degree. There is no pneumothorax. No significant pleural effusion. Musculoskeletal: No chest wall abnormality. No acute or significant osseous findings. Review of the MIP images confirms the above findings. CT ABDOMEN PELVIS FINDINGS Hepatobiliary: The liver is normal. Normal gallbladder.There is no biliary ductal dilation. Pancreas: Normal contours without ductal dilatation. No peripancreatic fluid collection. Spleen: No splenic laceration or hematoma. Adrenals/Urinary Tract: --Adrenal glands: No adrenal hemorrhage. --Right kidney/ureter: No hydronephrosis or  perinephric hematoma. --Left kidney/ureter: No hydronephrosis or perinephric hematoma. --Urinary bladder: Unremarkable. Stomach/Bowel: --Stomach/Duodenum: No hiatal hernia or other gastric abnormality. Normal duodenal course and caliber. --Small bowel: No dilatation or inflammation. --Colon: No focal abnormality. --Appendix: Normal. Vascular/Lymphatic: Normal course and caliber of the major abdominal vessels. There is no evidence for a thrombus within the IVC. --No retroperitoneal lymphadenopathy. --No mesenteric lymphadenopathy. --No pelvic or inguinal lymphadenopathy. Reproductive: Unremarkable Other: No ascites or free air. 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 Musculoskeletal. No acute displaced fractures. IMPRESSION: 1. No CT evidence for IVC thrombus. 2. Diffuse bilateral ground-glass airspace opacities consistent with the patient's history of viral pneumonia. 3. Prominent mediastinal and hilar lymph nodes, likely reactive. Aortic Atherosclerosis (ICD10-I70.0). Electronically Signed   By: Katherine Mantle M.D.   On: 02/28/2019 00:45   DG Chest Port 1 View  Result Date: 02/25/2019 CLINICAL DATA:  63 year old male COVID-19 pneumonia. EXAM: PORTABLE CHEST 1 VIEW COMPARISON:  March 09, 2019 and earlier. FINDINGS: Portable AP semi upright view at 0648 hours. Lower lung  volumes. Stable cardiac size and mediastinal contours. Visualized tracheal air column is within normal limits. Coarse and interstitial bilateral pulmonary opacity. Some improved ventilation at the right lung base. Most confluent opacity now remains at the left lung base. No superimposed pneumothorax or effusion. Paucity of bowel gas in the upper abdomen. No acute osseous abnormality identified. IMPRESSION: 1. Continued bilateral pneumonia with improved ventilation at the right lung base since 03/13/2019. 2. No new cardiopulmonary abnormality. Electronically Signed   By: Odessa Fleming M.D.   On: 02/25/2019 07:57   DG Chest Portable 1  View  Result Date: 03/10/2019 CLINICAL DATA:  Shortness of breath, COVID positive EXAM: PORTABLE CHEST 1 VIEW COMPARISON:  None. FINDINGS: Cardiomediastinal contours are normal. There is diffuse interstitial and airspace opacity throughout both the right and left chest. No signs of dense consolidation or evidence of pleural effusion. No acute bone finding. IMPRESSION: Diffuse interstitial and airspace opacity throughout both lungs. Findings which could be seen in viral or atypical pneumonia, also with COVID-19 infection. Asymmetric pulmonary edema could have similar appearance. Electronically Signed   By: Donzetta Kohut M.D.   On: 03/18/2019 13:01   DG Chest Port 1V today  Result Date: 03/02/2019 CLINICAL DATA:  Shortness of breath. Follow-up for COVID-19 pneumonia. EXAM: PORTABLE CHEST 1 VIEW COMPARISON:  02/25/2019 and earlier studies. FINDINGS: Hazy bilateral airspace lung opacities are without significant change from the most recent prior exam. No new lung abnormalities. No evidence of a pleural effusion and no pneumothorax. IMPRESSION: 1. No significant change from the most recent prior study. Persistent bilateral hazy airspace lung opacities consistent with COVID-19 pneumonia, with possible superimposed ARDS. Electronically Signed   By: Amie Portland M.D.   On: 03/02/2019 13:12   ECHOCARDIOGRAM COMPLETE  Result Date: 02/27/2019   ECHOCARDIOGRAM REPORT   Patient Name:   MARIN MILLEY Date of Exam: 02/27/2019 Medical Rec #:  [id number]    Height:       65.5 in Accession #:    [id number]   Weight:       187.4 lb Date of Birth:  01-09-1956   BSA:          1.94 m Patient Age:    63 years     BP:           109/71 mmHg Patient Gender: M            HR:           102 bpm. Exam Location:  Inpatient Procedure: 2D Echo, Cardiac Doppler and Color Doppler Indications:    Acute respiratory failure.  History:        Patient has no prior history of Echocardiogram examinations.                 Abnormal ECG. Covid 19  positive. Hypoxia. Pneumonia.  Sonographer:    Sheralyn Boatman RDCS Referring Phys: [id number] [facility]  Sonographer Comments: Technically difficult study due to poor echo windows. IMPRESSIONS  1. There is a mass like structure present in the IVC. This prevents collpase of the IVC during the examination. Due to the patient's history of covid-19 infection and LE DVT, this likely represents a thrombus. RV size and function are normal.  2. Left ventricular ejection fraction, by visual estimation, is >75%. The left ventricle has hyperdynamic function. There is mildly increased left ventricular hypertrophy.  3. Small left ventricular internal cavity size.  4. The left ventricle has no regional wall motion abnormalities.  5. Global right  ventricle has normal systolic function.The right ventricular size is normal. No increase in right ventricular wall thickness.  6. Left atrial size was normal.  7. Right atrial size was normal.  8. Presence of pericardial fat pad.  9. The pericardial effusion is circumferential. 10. Trivial pericardial effusion is present. 11. The mitral valve is grossly normal. No evidence of mitral valve regurgitation. 12. The tricuspid valve is grossly normal. Tricuspid valve regurgitation is trivial. 13. The aortic valve is grossly normal. Aortic valve regurgitation is not visualized. No evidence of aortic valve sclerosis or stenosis. 14. The pulmonic valve was grossly normal. Pulmonic valve regurgitation is not visualized. 15. TR signal is inadequate for assessing pulmonary artery systolic pressure. 16. 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 17. The inferior vena cava is normal in size with <50% respiratory variability, suggesting right atrial pressure of 8 mmHg. FINDINGS  Left Ventricle: Left ventricular ejection fraction, by visual estimation, is >75%. The left ventricle has hyperdynamic function. The left ventricle has no regional wall motion abnormalities. The left ventricular internal cavity size was  the LV cavity size is small. There is mildly increased left ventricular hypertrophy. Concentric left ventricular hypertrophy. Left ventricular diastolic parameters are consistent with age-related delayed relaxation (normal). Right Ventricle: The right ventricular size is normal. No increase in right ventricular wall thickness. Global RV systolic function is has normal systolic function. Left Atrium: Left atrial size was normal in size. Right Atrium: Right atrial size was normal in size Pericardium: Trivial pericardial effusion is present. The pericardial effusion is circumferential. Presence of pericardial fat pad. Mitral Valve: The mitral valve is grossly normal. No evidence of mitral valve regurgitation. Tricuspid Valve: The tricuspid valve is grossly normal. Tricuspid valve regurgitation is trivial. Aortic Valve: The aortic valve is grossly normal. Aortic valve regurgitation is not visualized. The aortic valve is structurally normal, with no evidence of sclerosis or stenosis. Pulmonic Valve: The pulmonic valve was grossly normal. Pulmonic valve regurgitation is not visualized. Pulmonic regurgitation is not visualized. Aorta: The aortic root is normal in size and structure. Venous: The inferior vena cava is normal in size with less than 50% respiratory variability, suggesting right atrial pressure of 8 mmHg. There is mass detected in the inferior vena cava. IAS/Shunts: No atrial level shunt detected by color flow Doppler.  LEFT VENTRICLE PLAX 2D LVIDd:         2.67 cm       Diastology LVIDs:         1.94 cm       LV e' lateral:   7.29 cm/s LV PW:         1.58 cm       LV E/e' lateral: 7.6 LV IVS:        1.20 cm       LV e' medial:    5.22 cm/s LVOT diam:     1.60 cm       LV E/e' medial:  10.6 LV SV:         15 ml LV SV Index:   7.22 LVOT Area:     2.01 cm  LV Volumes (MOD) LV area d, A2C:    12.70 cm LV area d, A4C:    17.80 cm LV area s, A2C:    8.18 cm LV area s, A4C:    9.86 cm LV major d, A2C:   4.92 cm  LV major d, A4C:   6.84 cm LV major s, A2C:   4.51 cm LV  major s, A4C:   5.77 cm LV vol d, MOD A2C: 27.7 ml LV vol d, MOD A4C: 40.2 ml LV vol s, MOD A2C: 12.9 ml LV vol s, MOD A4C: 14.3 ml LV SV MOD A2C:     14.8 ml LV SV MOD A4C:     40.2 ml LV SV MOD BP:      24.0 ml RIGHT VENTRICLE             IVC RV S prime:     22.40 cm/s  IVC diam: 1.53 cm TAPSE (M-mode): 1.7 cm LEFT ATRIUM             Index       RIGHT ATRIUM           Index LA diam:        2.40 cm 1.24 cm/m  RA Area:     13.30 cm LA Vol (A2C):   22.5 ml 11.63 ml/m RA Volume:   34.80 ml  17.98 ml/m LA Vol (A4C):   27.4 ml 14.16 ml/m LA Biplane Vol: 26.4 ml 13.64 ml/m  AORTIC VALVE LVOT Vmax:   133.00 cm/s LVOT Vmean:  105.000 cm/s LVOT VTI:    0.211 m  AORTA Ao Root diam: 2.90 cm MITRAL VALVE MV Area (PHT): 2.39 cm             SHUNTS MV PHT:        91.93 msec           Systemic VTI:  0.21 m MV Decel Time: 317 msec             Systemic Diam: 1.60 cm MV E velocity: 55.30 cm/s 103 cm/s MV A velocity: 89.20 cm/s 70.3 cm/s MV E/A ratio:  0.62       1.5  Eleonore Chiquito MD Electronically signed by Eleonore Chiquito MD Signature Date/Time: 02/27/2019/5:55:17 PM    Final    CT VENOGRAM ABD/PEL  Result Date: 02/28/2019 CLINICAL DATA:  Cardiac thrombus or embolic source suspected. Concern for thrombus within the proximal IVC seen on ECHO. EXAM: CT CHEST, ABDOMEN, AND PELVIS WITH CONTRAST TECHNIQUE: Multidetector CT imaging of the chest, abdomen and pelvis was performed following the standard protocol during bolus administration of intravenous contrast. CONTRAST:  145mL OMNIPAQUE IOHEXOL 350 MG/ML SOLN COMPARISON:  None. FINDINGS: CT CHEST FINDINGS Cardiovascular: There is no large centrally located pulmonary embolism. The heart size is normal. There is no significant pericardial effusion. Coronary artery calcifications are noted. Minimal atherosclerotic changes are noted of the thoracic aorta without evidence for a thoracic aortic aneurysm. Mediastinum/Nodes:  --there are prominent mediastinal and hilar lymph nodes, likely reactive. --No axillary lymphadenopathy. --No supraclavicular lymphadenopathy. --Normal thyroid gland. --The esophagus is unremarkable Lungs/Pleura: There are diffuse bilateral ground-glass airspace opacities involving all lobes to relatively equal degree. There is no pneumothorax. No significant pleural effusion. Musculoskeletal: No chest wall abnormality. No acute or significant osseous findings. Review of the MIP images confirms the above findings. CT ABDOMEN PELVIS FINDINGS Hepatobiliary: The liver is normal. Normal gallbladder.There is no biliary ductal dilation. Pancreas: Normal contours without ductal dilatation. No peripancreatic fluid collection. Spleen: No splenic laceration or hematoma. Adrenals/Urinary Tract: --Adrenal glands: No adrenal hemorrhage. --Right kidney/ureter: No hydronephrosis or perinephric hematoma. --Left kidney/ureter: No hydronephrosis or perinephric hematoma. --Urinary bladder: Unremarkable. Stomach/Bowel: --Stomach/Duodenum: No hiatal hernia or other gastric abnormality. Normal duodenal course and caliber. --Small bowel: No dilatation or inflammation. --Colon: No focal abnormality. --Appendix: Normal. Vascular/Lymphatic: Normal course and caliber of the  major abdominal vessels. There is no evidence for a thrombus within the IVC. --No retroperitoneal lymphadenopathy. --No mesenteric lymphadenopathy. --No pelvic or inguinal lymphadenopathy. Reproductive: Unremarkable Other: No ascites or free air. 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 Musculoskeletal. No acute displaced fractures. IMPRESSION: 1. No CT evidence for IVC thrombus. 2. Diffuse bilateral ground-glass airspace opacities consistent with the patient's history of viral pneumonia. 3. Prominent mediastinal and hilar lymph nodes, likely reactive. Aortic Atherosclerosis (ICD10-I70.0). Electronically Signed   By: Katherine Mantle M.D.   On: 02/28/2019  00:45   VAS [place] LOWER EXTREMITY VENOUS (DVT)  Result Date: 02/24/2019  Lower Venous Study Indications: Elevated Ddimer.  Risk Factors: COVID 19 positive. Comparison Study: No prior studies. Performing Technologist: Chanda Busing RVT  Examination Guidelines: A complete evaluation includes B-mode imaging, spectral Doppler, color Doppler, and power Doppler as needed of all accessible portions of each vessel. Bilateral testing is considered an integral part of a complete examination. Limited examinations for reoccurring indications may be performed as noted.  +---------+---------------+---------+-----------+----------+--------------+ RIGHT    CompressibilityPhasicitySpontaneityPropertiesThrombus Aging +---------+---------------+---------+-----------+----------+--------------+ CFV      Full           Yes      Yes                                 +---------+---------------+---------+-----------+----------+--------------+ SFJ      Full                                                        +---------+---------------+---------+-----------+----------+--------------+ FV Prox  Full                                                        +---------+---------------+---------+-----------+----------+--------------+ FV Mid   Full                                                        +---------+---------------+---------+-----------+----------+--------------+ FV DistalFull                                                        +---------+---------------+---------+-----------+----------+--------------+ PFV      Full                                                        +---------+---------------+---------+-----------+----------+--------------+ POP      Full           Yes      Yes                                 +---------+---------------+---------+-----------+----------+--------------+  PTV      Partial                                      Acute           +---------+---------------+---------+-----------+----------+--------------+ PERO     Full                                                        +---------+---------------+---------+-----------+----------+--------------+   +---------+---------------+---------+-----------+----------+--------------+ LEFT     CompressibilityPhasicitySpontaneityPropertiesThrombus Aging +---------+---------------+---------+-----------+----------+--------------+ CFV      Full           Yes      Yes                                 +---------+---------------+---------+-----------+----------+--------------+ SFJ      Full                                                        +---------+---------------+---------+-----------+----------+--------------+ FV Prox  Full                                                        +---------+---------------+---------+-----------+----------+--------------+ FV Mid   Full                                                        +---------+---------------+---------+-----------+----------+--------------+ FV DistalFull                                                        +---------+---------------+---------+-----------+----------+--------------+ PFV      Full                                                        +---------+---------------+---------+-----------+----------+--------------+ POP      Full           Yes      Yes                                 +---------+---------------+---------+-----------+----------+--------------+ PTV      Full                                                        +---------+---------------+---------+-----------+----------+--------------+  PERO     Full                                                        +---------+---------------+---------+-----------+----------+--------------+     Summary: Right: Findings consistent with acute deep vein thrombosis involving the right posterior tibial veins. No  cystic structure found in the popliteal fossa. Left: There is no evidence of deep vein thrombosis in the lower extremity. No cystic structure found in the popliteal fossa.  *See table(s) above for measurements and observations. Electronically signed by Waverly Ferrari MD on 02/24/2019 at 3:55:02 PM.    Final

## 2019-03-06 NOTE — Plan of Care (Signed)
PT requiring HHFNC 30L /100%  with 15L NNRB mask to maintain sats 88% or better while resting inbed for with any movement in bed.Pt does become more anxious as doing activity d/t how SOB or winded he feels with activity.  Nurse attempt to encourage him as he did any activity as well as assist with recovering of breathing post activity.  Pt did sleep 4 hrs during night after having xanax and tylenol with midnight assessment.  Will continue to work with pt's activity intolerance and anxiety level overall . Problem: Activity: Goal: Risk for activity intolerance will decrease Outcome: Not Progressing   Problem: Coping: Goal: Level of anxiety will decrease Outcome: Not Progressing

## 2019-03-06 NOTE — Progress Notes (Signed)
Unopened Xanax found in patient;s room by primary RN Caryl Pina. Medication returned to pyxis. Pharmacy notified.

## 2019-03-06 NOTE — Progress Notes (Signed)
RN to room for patient saturations <85, lowest 75%. RT called and dicussed increasing O2 Liters to 45 Liters. Messaged MD of continued desaturations- MD to follow up via phone call. Patient increased WOB, but no subjective distress.

## 2019-03-06 NOTE — Plan of Care (Signed)
  Problem: Education: Goal: Knowledge of risk factors and measures for prevention of condition will improve Outcome: Progressing   Problem: Respiratory: Goal: Will maintain a patent airway Outcome: Progressing   Problem: Health Behavior/Discharge Planning: Goal: Ability to manage health-related needs will improve Outcome: Progressing   

## 2019-03-06 NOTE — Progress Notes (Signed)
ANTICOAGULATION CONSULT NOTE - Follow Up Consult  Pharmacy Consult for Lovenox Indication: DVT  Allergies  Allergen Reactions  . Neomycin Other (See Comments) and Swelling    TOPICAL FORM ONLY.  . Other Other (See Comments)    Soy bean oil causes bronchial congestion, phlegm.  Animal dander.  Seasonal and environmental.  . Sulfur   . Thimerosal   . Apricot Flavor     dermatitis    Patient Measurements: Height: 5' 5.5" (166.4 cm) Weight: 170 lb 15.1 oz (77.5 kg) IBW/kg (Calculated) : 62.65  Vital Signs: Temp: 97.7 F (36.5 C) (12/16 0000) Temp Source: Axillary (12/16 0000) BP: 101/54 (12/16 0439) Pulse Rate: 81 (12/16 0439)  Labs: Recent Labs    03/04/19 0450 03/05/19 0410 03/06/19 0110  HGB 18.3* 18.1* 18.3*  HCT 54.6* 54.4* 54.2*  PLT 129* 129* 133*  CREATININE 0.92 0.86 0.97    Estimated Creatinine Clearance: 74.7 mL/min (by C-G formula based on SCr of 0.97 mg/dL).   Medications:  Scheduled:  . Chlorhexidine Gluconate Cloth  6 each Topical Daily  . cholecalciferol  1,000 Units Oral Daily  . dexamethasone (DECADRON) injection  4 mg Intravenous Q24H  . enoxaparin (LOVENOX) injection  80 mg Subcutaneous Q12H  . furosemide  40 mg Intravenous Daily  . insulin aspart  0-9 Units Subcutaneous TID WC  . mouth rinse  15 mL Mouth Rinse BID  . metoprolol tartrate  12.5 mg Oral BID  . pantoprazole  40 mg Oral Daily  . rosuvastatin  20 mg Oral q1800  . sodium chloride flush  3 mL Intravenous Q12H  . vitamin C  500 mg Oral Daily  . zinc sulfate  220 mg Oral Daily   Infusions:    Assessment: 63 yo M admitted with COVID, found to also have RLE DVT.  Pt continues on treatment dose Lovenox.  CBC stable.  Goal of Therapy:  Anti-Xa level 0.6-1 units/ml 4hrs after LMWH dose given Monitor platelets by anticoagulation protocol: Yes   Plan:  Continue Lovenox 1mg /kg q12h as ordered. Follow-up clinical progress and plans for oral anticoagulation. [place] for s/sx  bleeding.  [employer], Pharm.D., BCPS Clinical Pharmacist Clinical phone for 03/06/2019 from 8:30-4:00 is [phone].   03/06/2019 8:52 AM

## 2019-03-07 ENCOUNTER — Inpatient Hospital Stay (HOSPITAL_COMMUNITY): Payer: Medicaid Other

## 2019-03-07 LAB — GLUCOSE, CAPILLARY
Glucose-Capillary: 143 mg/dL — ABNORMAL HIGH (ref 70–99)
Glucose-Capillary: 188 mg/dL — ABNORMAL HIGH (ref 70–99)
Glucose-Capillary: 228 mg/dL — ABNORMAL HIGH (ref 70–99)

## 2019-03-07 LAB — COMPREHENSIVE METABOLIC PANEL
ALT: 44 U/L (ref 0–44)
AST: 35 U/L (ref 15–41)
Albumin: 2.6 g/dL — ABNORMAL LOW (ref 3.5–5.0)
Alkaline Phosphatase: 150 U/L — ABNORMAL HIGH (ref 38–126)
Anion gap: 16 — ABNORMAL HIGH (ref 5–15)
BUN: 49 mg/dL — ABNORMAL HIGH (ref 8–23)
CO2: 30 mmol/L (ref 22–32)
Calcium: 8.6 mg/dL — ABNORMAL LOW (ref 8.9–10.3)
Chloride: 87 mmol/L — ABNORMAL LOW (ref 98–111)
Creatinine, Ser: 0.87 mg/dL (ref 0.61–1.24)
GFR calc Af Amer: >60 mL/min (ref >60)
GFR calc non Af Amer: >60 mL/min (ref >60)
Glucose, Bld: 103 mg/dL — ABNORMAL HIGH (ref 70–99)
Potassium: 3.9 mmol/L (ref 3.5–5.1)
Sodium: 133 mmol/L — ABNORMAL LOW (ref 135–145)
Total Bilirubin: 1 mg/dL (ref 0.3–1.2)
Total Protein: 7 g/dL (ref 6.5–8.1)

## 2019-03-07 LAB — CBC
HCT: 56.4 % — ABNORMAL HIGH (ref 39.0–52.0)
Hemoglobin: 19 g/dL — ABNORMAL HIGH (ref 13.0–17.0)
MCH: 29.7 pg (ref 26.0–34.0)
MCHC: 33.7 g/dL (ref 30.0–36.0)
MCV: 88.1 fL (ref 80.0–100.0)
Platelets: 132 10*3/uL — ABNORMAL LOW (ref 150–400)
RBC: 6.4 MIL/uL — ABNORMAL HIGH (ref 4.22–5.81)
RDW: 13.3 % (ref 11.5–15.5)
WBC: 16 10*3/uL — ABNORMAL HIGH (ref 4.0–10.5)
nRBC: 0 % (ref 0.0–0.2)

## 2019-03-07 LAB — FERRITIN: Ferritin: 1287 ng/mL — ABNORMAL HIGH (ref 24–336)

## 2019-03-07 LAB — D-DIMER, QUANTITATIVE: D-Dimer, Quant: 2 ug/mL-FEU — ABNORMAL HIGH (ref 0.00–0.50)

## 2019-03-07 LAB — C-REACTIVE PROTEIN: CRP: 0.5 mg/dL (ref <1.0)

## 2019-03-07 LAB — MRSA PCR SCREENING: MRSA by PCR: NEGATIVE

## 2019-03-07 MED ORDER — VANCOMYCIN HCL 1500 MG/300ML IV SOLN
1500.0000 mg | Freq: Once | INTRAVENOUS | Status: AC
  Administered 2019-03-07: 10:00:00 1500 mg via INTRAVENOUS
  Filled 2019-03-07: qty 300

## 2019-03-07 MED ORDER — SODIUM CHLORIDE 0.9 % IV SOLN
2.0000 g | Freq: Three times a day (TID) | INTRAVENOUS | Status: DC
  Administered 2019-03-07 – 2019-03-09 (×7): 2 g via INTRAVENOUS
  Filled 2019-03-07 (×8): qty 2

## 2019-03-07 MED ORDER — VANCOMYCIN HCL IN DEXTROSE 1-5 GM/200ML-% IV SOLN
1000.0000 mg | Freq: Two times a day (BID) | INTRAVENOUS | Status: DC
  Administered 2019-03-07 – 2019-03-09 (×4): 1000 mg via INTRAVENOUS
  Filled 2019-03-07 (×5): qty 200

## 2019-03-07 NOTE — Plan of Care (Signed)
Teaching continued with patient and family via phone. All questions answered at this time. Emotional support provided to patient and family. +Bsx4. LBM 03/05/19. IV lasix continued, pt needs assistance with urinal, vdg+incontinent. Condom cath placed for accurate I&O. Afebrile, HR 80-110s NSR/St with occasional PVCs noted. +DVT, continuing on SQ lovenox per team. Respiratory status continues to worsen, MD aware. Remains on [place] +NRB, now 60L/100%. Plan for tentative transfer to ICU per Dr. Sloan Leiter for intubation. Patient unable to tolerate even minimal amounts of activity. Sat at side of bed with RN this AM, Spo2 down to low 70s at that time on HHFNC+NRB. Patient took >30 minutes to recover even after [place] settings increased. Breathing appears more labored and with accessory muscle use with any activity, team aware. PIV x2 intact, patent. IV vancomycin and IV Cefepime started by team d/t concerns of superimposed bacterial PNA. Unable to tolerate solid foods at this time d/t O2 requirement. Encouraging PO fluids as tolerated. No c/o pain. Will continue to monitor.

## 2019-03-07 NOTE — Progress Notes (Signed)
Called and spoke with patient's son Jeneen Rinks to provide an update on patient's status. Jeneen Rinks and patient spoke directly via speakerphone on RNs phone. All Jeneen Rinks' questions answered at this time.

## 2019-03-07 NOTE — Progress Notes (Signed)
Patient has facetimed with family today x15 minutes earlier in shift. Phone call with patient and son Jeneen Rinks this evening both facilitated by RN.

## 2019-03-07 NOTE — Progress Notes (Signed)
PT Cancellation Note  Patient Details Name: Alexander Erickson MRN: [id number] DOB: 10-Aug-1955   Cancelled Treatment:    Reason Eval/Treat Not Completed: Medical issues which prohibited therapy. Attempted to see pt at 1051 but pt had just completed working with nursing staff on cleaning up, upon therapist arrival to room pt sats 84% on 15L and NRB. Pt having great difficulty recovering from this,  Attempted to see pt in pm and nurse reports pt still not appropriate he is desating to low 70s with minimal activity and taking upwards of 45 mins to recover. Will hold off on tx at this time and f/u tomorrow.  Alexander Erickson, PT    Delford Field 03/07/2019, 3:10 PM

## 2019-03-07 NOTE — Progress Notes (Signed)
Pharmacy Antibiotic Note  Alexander Erickson is a 63 y.o. male admitted on 02/21/2019 with COVID-19.  Pharmacy has been consulted for Cefepime and Vancomycin dosing for HCAP.  Plan: Cefepime 2g IV q8h Vancomycin 1500mg  IV x1 then 1g IV q12h. Measure Vanc peak and trough at steady state. Goal AUC = 400 - 550.  Expected AUC 530 using SCr 0.87 Follow up renal function, culture results, and clinical course.   Height: 5' 5.5" (166.4 cm) Weight: 170 lb 15.1 oz (77.5 kg) IBW/kg (Calculated) : 62.65  Temp (24hrs), Avg:97.6 F (36.4 C), Min:97 F (36.1 C), Max:98.6 F (37 C)  Recent Labs  Lab 03/03/19 0050 03/04/19 0450 03/05/19 0410 03/06/19 0110 03/07/19 0240  WBC 13.2* 13.3* 12.7* 13.7* 16.0*  CREATININE 0.88 0.92 0.86 0.97 0.87    Estimated Creatinine Clearance: 83.2 mL/min (by C-G formula based on SCr of 0.87 mg/dL).    Allergies  Allergen Reactions  . Neomycin Other (See Comments) and Swelling    TOPICAL FORM ONLY.  . Other Other (See Comments)    Soy bean oil causes bronchial congestion, phlegm.  Animal dander.  Seasonal and environmental.  . Sulfur   . Thimerosal   . Apricot Flavor     dermatitis    Antimicrobials this admission: Remdesevir 12/4 >> 12/13 Azith 12/4 >> 12/8 CTX 12/4>> 12/8  Doxy po 12/4>>12/5 Vancomycin 12/17 >>  Cefepime 12/17 >>   Dose adjustments this admission:   Microbiology results: 11/30 COVID + 12/5 RCx: nl flora  12/10 MRSA PCR negative  Thank you for allowing pharmacy to be a part of this patient's care.  Gretta Arab PharmD, BCPS Clinical pharmacist phone 7am- 5pm: [phone] 03/07/2019 8:05 AM

## 2019-03-07 NOTE — Progress Notes (Signed)
PROGRESS NOTE                                                                                                                                                                                                             Patient Demographics:    Alexander Erickson, is a 62 y.o. male, DOB - [birth year], ZOX:[id number]  Outpatient Primary MD for the patient is Patient, No Pcp Per   Admit date - 02/20/2019   LOS - 13  No chief complaint on file.      Brief Narrative: Patient is a 63 y.o. male with PMHx of HTN, HLD-who presented with 1 week history of progressive cough and shortness of breath-he was found to have acute hypoxemic respiratory failure in the setting of COVID-19 pneumonia along with severe hyponatremia.   Subjective:   He is just about able to talk in full sentences but does get dyspneic.  Still on both heated high flow and 100% NRB.  Per RN staff-minimal movement causes him to desaturate to the 70s-and it takes good 20-25 minutes before he is able to maintain adequate sats.   Assessment  & Plan :   Acute Hypoxic Resp Failure due to Covid 19 Viral pneumonia and concurrent bacterial pneumonia: Continues to remain very tenuous-he has finished a course of remdesivir and empiric Rocephin on 12/8.  Repeat chest x-ray (personally reviewed) shows what appears to be a new consolidation in the left lower lobe.  Given how tenuous he is-and severe hypoxemia-we will go ahead and start him on vancomycin and cefepime.  Obtain sputum cultures.  Very difficult situation-not much improvement in spite of almost 2 weeks in the hospital.  Will complete day 14 of steroids today-suspect he is in the fibrotic stage-and probably does not require steroids any further.    Long discussion with patient-subsequently his son over the phone-given tenuous situation-in high risk for further worsening and intubation-at this point-both patient and family would  want to continue with full scope of treatment-but patient was leaning towards not being intubated long-term.  We will continue to engage family and delineate further goals of care.  Fever: afebrile  O2 requirements:  SpO2: 92 % O2 Flow Rate (L/min): On both heated high flow and 100% NRB mask. FiO2 (%): 100 %   COVID-19 Labs: Recent Labs    03/05/19 0410 03/06/19 0110  03/07/19 0240  DDIMER 2.02* 2.24* 2.00*  FERRITIN 1,264* 1,252* 1,287*  CRP 1.0* 0.6 0.5       Component Value Date/Time   BNP 53.0 March 04, 2019 1234    Recent Labs  Lab 03/06/19 0110  PROCALCITON 0.13    Lab Results  Component Value Date   SARSCOV2NAA Detected (A) 02/18/2019     COVID-19 Medications: Decadron 12/4 >12/17 Remdesivir 12/4 > 12/13 Actemra 12/6 x 1  Other medications: Diuretics:Euvolemic-continue Lasix to maintain negative balance (- 10 L so far) Antibiotics:  Vancomycin 12/17>> Cefepime 12/17>> Rocephin: 12/4>>12/8 Insulin: CBG stable on SSI.  A1c 6.8-May need initiation of metformin on discharge.   Microbiology: 12/5>> sputum culture: Negative 12/10>> MRSA PCR negative  Prone/Incentive Spirometry: encouraged patient to lie prone for 3-4 hours at a time for a total of 16 hours a day, and to encourage incentive spirometry use 3-4/hour  DVT Prophylaxis  :  Lovenox -therapeutic dosing  Right lower extremity DVT with possible IVC clot seen on echo: Continue full dose Lovenox.  CT chest did not show a large central pulmonary thrombus.  CT venogram of the chest/abdomen and pelvis did not show any IVC clot,  discussed case with [facility]-Dr. Towanda Malkin at this time recommends continuing anticoagulation without the need for thrombolytics or any other vascular procedures.    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  Hyponatremia: Resolved with diuresis.  Dyslipidemia: Continue statin.  Prolonged QTC: Resolved.  Obesity: Estimated body mass index is 28.01  kg/m as calculated from the following:   Height as of this encounter: 5' 5.5" (1.664 m).   Weight as of this encounter: 77.5 kg.    GI prophylaxis: PPI  Consults  :  None  Procedures  :  None  ABG:    Component Value Date/Time   PHART 7.597 (H) 03/06/2019 2147   PCO2ART 34.3 03/06/2019 2147   PO2ART 41.0 (L) 03/06/2019 2147   HCO3 33.6 (H) 03/06/2019 2147   TCO2 35 (H) 03/06/2019 2147   O2SAT 86.0 03/06/2019 2147    Vent Settings: N/a  Condition - Extremely Guarded-very tenuous with risk for further deterioration  Family Communication  : Son on 12/17  Code Status :  Full Code  Diet :  Diet Order            Diet Carb Modified Fluid consistency: Thin; Room service appropriate? Yes  Diet effective now               Disposition Plan  :  Remain hospitalized  Barriers to discharge: Hypoxia requiring O2 supplementation  Antimicorbials  :    Anti-infectives (From admission, onward)   Start     Dose/Rate Route Frequency Ordered Stop   03/07/19 2200  vancomycin (VANCOCIN) IVPB 1000 mg/200 mL premix     1,000 mg 200 mL/hr over 60 Minutes Intravenous Every 12 hours 03/07/19 1217     03/07/19 0900  vancomycin (VANCOREADY) IVPB 1500 mg/300 mL     1,500 mg 150 mL/hr over 120 Minutes Intravenous  Once 03/07/19 0810 03/07/19 1229   03/07/19 0815  ceFEPIme (MAXIPIME) 2 g in sodium chloride 0.9 % 100 mL IVPB     2 g 200 mL/hr over 30 Minutes Intravenous Every 8 hours 03/07/19 0810     02/27/19 1500  remdesivir 100 mg in sodium chloride 0.9 % 100 mL IVPB    Note to Pharmacy: To complete a total of 10 days of therapy   100 mg 200 mL/hr over 30 Minutes  Intravenous Daily 02/27/19 1320 03/03/19 1850   02/23/19 1000  remdesivir 100 mg in sodium chloride 0.9 % 100 mL IVPB  Status:  Discontinued     100 mg 200 mL/hr over 30 Minutes Intravenous Daily Feb 27, 2019 2057 02-27-19 2102   02/23/19 1000  remdesivir 100 mg in sodium chloride 0.9 % 100 mL IVPB     100 mg 200 mL/hr over 30  Minutes Intravenous Daily Feb 27, 2019 2104 02/26/19 0915   02/27/19 2315  doxycycline (VIBRA-TABS) tablet 100 mg  Status:  Discontinued     100 mg Oral Every 12 hours 2019-02-27 2306 02/23/19 1657   2019/02/27 2100  remdesivir 200 mg in sodium chloride 0.9% 250 mL IVPB  Status:  Discontinued     200 mg 580 mL/hr over 30 Minutes Intravenous Once 02-27-19 2057 02-27-19 2102   02-27-19 2100  cefTRIAXone (ROCEPHIN) 2 g in sodium chloride 0.9 % 100 mL IVPB     2 g 200 mL/hr over 30 Minutes Intravenous Every 24 hours 02/27/2019 2058 02/26/19 2215   Feb 27, 2019 2100  azithromycin (ZITHROMAX) tablet 500 mg  Status:  Discontinued     500 mg Oral Daily 2019-02-27 2058 02-27-19 2306      Inpatient Medications  Scheduled Meds: . Chlorhexidine Gluconate Cloth  6 each Topical Daily  . cholecalciferol  1,000 Units Oral Daily  . dexamethasone (DECADRON) injection  4 mg Intravenous Q24H  . enoxaparin (LOVENOX) injection  80 mg Subcutaneous Q12H  . furosemide  40 mg Intravenous Daily  . insulin aspart  0-9 Units Subcutaneous TID WC  . mouth rinse  15 mL Mouth Rinse BID  . metoprolol tartrate  12.5 mg Oral BID  . pantoprazole  40 mg Oral Daily  . rosuvastatin  20 mg Oral q1800  . sodium chloride flush  3 mL Intravenous Q12H  . vitamin C  500 mg Oral Daily  . zinc sulfate  220 mg Oral Daily   Continuous Infusions: . ceFEPime (MAXIPIME) IV Stopped (03/07/19 0911)  . vancomycin     PRN Meds:.acetaminophen, albuterol, ALPRAZolam, HYDROcodone-acetaminophen, influenza vac split quadrivalent PF, labetalol, lip balm, oxymetazoline, phenol, sodium chloride   Time Spent in minutes 35   See all Orders from today for further details   Jeoffrey Massed M.D on 03/07/2019 at 1:21 PM  To page go to [email or web address] - use universal password  [facility] -  Office  [phone]    Objective:   Vitals:   03/07/19 0849 03/07/19 0900 03/07/19 1000 03/07/19 1100  BP:  (!) 133/98 (!) 162/92 110/83  Pulse: (!) 102  (!) 104 100 98  Resp: (!) 32 (!) 30 (!) 29 (!) 34  Temp:      TempSrc:      SpO2: (!) 85% (!) 89% 95% 92%  Weight:      Height:        Wt Readings from Last 3 Encounters:  03/03/19 77.5 kg  February 27, 2019 86.2 kg     Intake/Output Summary (Last 24 hours) at 03/07/2019 1321 Last data filed at 03/07/2019 1200 Gross per 24 hour  Intake 815.65 ml  Output 1400 ml  Net -584.35 ml     Physical Exam Gen Exam:Alert awake-in mild distress after talking a full sentence.  Very frail/chronically sick appearing. HEENT:atraumatic, normocephalic Chest: B/L clear to auscultation anteriorly CVS:S1S2 regular Abdomen:soft non tender, non distended Extremities:no edema Neurology: Non focal Skin: no rash   Data Review:    CBC Recent Labs  Lab 03/03/19 0050 03/04/19 0450 03/05/19 0410  03/06/19 0110 03/06/19 2147 03/07/19 0240  WBC 13.2* 13.3* 12.7* 13.7*  --  16.0*  HGB 18.0* 18.3* 18.1* 18.3* 19.0* 19.0*  HCT 53.0* 54.6* 54.4* 54.2* 56.0* 56.4*  PLT 131* 129* 129* 133*  --  132*  MCV 87.6 89.1 88.7 88.6  --  88.1  MCH 29.8 29.9 29.5 29.9  --  29.7  MCHC 34.0 33.5 33.3 33.8  --  33.7  RDW 12.8 13.0 13.2 13.1  --  13.3    Chemistries  Recent Labs  Lab 03/02/19 0316 03/03/19 0050 03/04/19 0450 03/05/19 0410 03/06/19 0110 03/06/19 2147 03/07/19 0240  NA 133* 136 138 135 136 130* 133*  K 4.4 4.3 4.5 4.7 4.1 4.1 3.9  CL 91* 94* 92* 89* 89*  --  87*  CO2 29 28 34* 33* 30  --  30  GLUCOSE 110* 149* 101* 129* 129*  --  103*  BUN 40* 41* 40* 44* 44*  --  49*  CREATININE 0.93 0.88 0.92 0.86 0.97  --  0.87  CALCIUM 8.4* 8.5* 8.6* 8.7* 8.5*  --  8.6*  MG 2.3  --   --   --  2.3  --   --   AST 34 36 37 36 34  --  35  ALT 23 26 33 40 40  --  44  ALKPHOS 180* 189* 171* 161* 153*  --  150*  BILITOT 1.2 1.1 1.0 0.8 1.0  --  1.0   ------------------------------------------------------------------------------------------------------------------ No results for input(s): CHOL, HDL,  LDLCALC, TRIG, CHOLHDL, LDLDIRECT in the last 72 hours.  Lab Results  Component Value Date   HGBA1C 6.8 (H) 02/27/2019   ------------------------------------------------------------------------------------------------------------------ No results for input(s): TSH, T4TOTAL, T3FREE, THYROIDAB in the last 72 hours.  Invalid input(s): FREET3 ------------------------------------------------------------------------------------------------------------------ Recent Labs    03/06/19 0110 03/07/19 0240  FERRITIN 1,252* 1,287*    Coagulation profile No results for input(s): INR, PROTIME in the last 168 hours.  Recent Labs    03/06/19 0110 03/07/19 0240  DDIMER 2.24* 2.00*    Cardiac Enzymes No results for input(s): CKMB, TROPONINI, MYOGLOBIN in the last 168 hours.  Invalid input(s): CK ------------------------------------------------------------------------------------------------------------------    Component Value Date/Time   BNP 53.0 03/05/2019 1234    Micro Results Recent Results (from the past 240 hour(s))  MRSA PCR Screening     Status: None   Collection Time: 02/28/19  4:31 AM   Specimen: Nasal Mucosa; Nasopharyngeal  Result Value Ref Range Status   MRSA by PCR NEGATIVE NEGATIVE Final    Comment:        The GeneXpert MRSA Assay (FDA approved for NASAL specimens only), is one component of a comprehensive MRSA colonization surveillance program. It is not intended to diagnose MRSA infection nor to guide or monitor treatment for MRSA infections. Performed at [facility], [address] [place], [place] [address]     Radiology Reports CT CHEST W CONTRAST  Result Date: 02/28/2019 CLINICAL DATA:  Cardiac thrombus or embolic source suspected. Concern for thrombus within the proximal IVC seen on ECHO. EXAM: CT CHEST, ABDOMEN, AND PELVIS WITH CONTRAST TECHNIQUE: Multidetector CT imaging of the chest, abdomen and pelvis was performed following  the standard protocol during bolus administration of intravenous contrast. CONTRAST:  OMNIPAQUE IOHEXOL 350 MG/ML SOLN COMPARISON:  None. FINDINGS: CT CHEST FINDINGS Cardiovascular: There is no large centrally located pulmonary embolism. The heart size is normal. There is no significant pericardial effusion. Coronary artery calcifications are noted. Minimal atherosclerotic changes  are noted of the thoracic aorta without evidence for a thoracic aortic aneurysm. Mediastinum/Nodes: --there are prominent mediastinal and hilar lymph nodes, likely reactive. --No axillary lymphadenopathy. --No supraclavicular lymphadenopathy. --Normal thyroid gland. --The esophagus is unremarkable Lungs/Pleura: There are diffuse bilateral ground-glass airspace opacities involving all lobes to relatively equal degree. There is no pneumothorax. No significant pleural effusion. Musculoskeletal: No chest wall abnormality. No acute or significant osseous findings. Review of the MIP images confirms the above findings. CT ABDOMEN PELVIS FINDINGS Hepatobiliary: The liver is normal. Normal gallbladder.There is no biliary ductal dilation. Pancreas: Normal contours without ductal dilatation. No peripancreatic fluid collection. Spleen: No splenic laceration or hematoma. Adrenals/Urinary Tract: --Adrenal glands: No adrenal hemorrhage. --Right kidney/ureter: No hydronephrosis or perinephric hematoma. --Left kidney/ureter: No hydronephrosis or perinephric hematoma. --Urinary bladder: Unremarkable. Stomach/Bowel: --Stomach/Duodenum: No hiatal hernia or other gastric abnormality. Normal duodenal course and caliber. --Small bowel: No dilatation or inflammation. --Colon: No focal abnormality. --Appendix: Normal. Vascular/Lymphatic: Normal course and caliber of the major abdominal vessels. There is no evidence for a thrombus within the IVC. --No retroperitoneal lymphadenopathy. --No mesenteric lymphadenopathy. --No pelvic or inguinal lymphadenopathy.  Reproductive: Unremarkable Other: No ascites or free air. 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 Musculoskeletal. No acute displaced fractures. IMPRESSION: 1. No CT evidence for IVC thrombus. 2. Diffuse bilateral ground-glass airspace opacities consistent with the patient's history of viral pneumonia. 3. Prominent mediastinal and hilar lymph nodes, likely reactive. Aortic Atherosclerosis (ICD10-I70.0). Electronically Signed   By: Katherine Mantle M.D.   On: 02/28/2019 00:45   DG Chest Port AM  Result Date: 03/07/2019 CLINICAL DATA:  COVID infection EXAM: PORTABLE CHEST 1 VIEW COMPARISON:  03/02/2019; 02/25/2019; chest CT-02/27/2019 FINDINGS: Grossly unchanged cardiac silhouette and mediastinal contours given persistently reduced lung volumes. There is persistent thickening the right paratracheal stripe presumably secondary prominent vasculature. Worsening diffuse bilateral interstitial opacities and consolidative opacities involving the left mid and lower lung with suspected retrocardiac air bronchograms. No definite pleural effusion. No definite evidence of edema. No acute osseous abnormalities. IMPRESSION: Worsening bilateral interstitial opacities and consolidative opacities involving the left mid and lower lung, nonspecific though worrisome for progression of atypical infection, though note, underlying edema not excluded. Electronically Signed   By: Simonne Come M.D.   On: 03/07/2019 08:19   DG Chest Port 1 View  Result Date: 02/25/2019 CLINICAL DATA:  63 year old male COVID-19 pneumonia. EXAM: PORTABLE CHEST 1 VIEW COMPARISON:  03/12/2019 and earlier. FINDINGS: Portable AP semi upright view at 0648 hours. Lower lung volumes. Stable cardiac size and mediastinal contours. Visualized tracheal air column is within normal limits. Coarse and interstitial bilateral pulmonary opacity. Some improved ventilation at the right lung base. Most confluent opacity now remains at the left lung base. No  superimposed pneumothorax or effusion. Paucity of bowel gas in the upper abdomen. No acute osseous abnormality identified. IMPRESSION: 1. Continued bilateral pneumonia with improved ventilation at the right lung base since 03/05/2019. 2. No new cardiopulmonary abnormality. Electronically Signed   By: Odessa Fleming M.D.   On: 02/25/2019 07:57   DG Chest Portable 1 View  Result Date: 02/27/2019 CLINICAL DATA:  Shortness of breath, COVID positive EXAM: PORTABLE CHEST 1 VIEW COMPARISON:  None. FINDINGS: Cardiomediastinal contours are normal. There is diffuse interstitial and airspace opacity throughout both the right and left chest. No signs of dense consolidation or evidence of pleural effusion. No acute bone finding. IMPRESSION: Diffuse interstitial and airspace opacity throughout both lungs. Findings which could be seen in viral or atypical pneumonia, also  with COVID-19 infection. Asymmetric pulmonary edema could have similar appearance. Electronically Signed   By: Donzetta Kohut M.D.   On: March 24, 2019 13:01   DG Chest Port 1V today  Result Date: 03/02/2019 CLINICAL DATA:  Shortness of breath. Follow-up for COVID-19 pneumonia. EXAM: PORTABLE CHEST 1 VIEW COMPARISON:  02/25/2019 and earlier studies. FINDINGS: Hazy bilateral airspace lung opacities are without significant change from the most recent prior exam. No new lung abnormalities. No evidence of a pleural effusion and no pneumothorax. IMPRESSION: 1. No significant change from the most recent prior study. Persistent bilateral hazy airspace lung opacities consistent with COVID-19 pneumonia, with possible superimposed ARDS. Electronically Signed   By: Amie Portland M.D.   On: 03/02/2019 13:12   ECHOCARDIOGRAM COMPLETE  Result Date: 02/27/2019   ECHOCARDIOGRAM REPORT   Patient Name:   ELON EOFF Date of Exam: 02/27/2019 Medical Rec #:  [id number]    Height:       65.5 in Accession #:    [id number]   Weight:       187.4 lb Date of Birth:  1956-02-21   BSA:           1.94 m Patient Age:    63 years     BP:           109/71 mmHg Patient Gender: M            HR:           102 bpm. Exam Location:  Inpatient Procedure: 2D Echo, Cardiac Doppler and Color Doppler Indications:    Acute respiratory failure.  History:        Patient has no prior history of Echocardiogram examinations.                 Abnormal ECG. Covid 19 positive. Hypoxia. Pneumonia.  Sonographer:    Sheralyn Boatman RDCS Referring Phys: [id number] [facility]  Sonographer Comments: Technically difficult study due to poor echo windows. IMPRESSIONS  1. There is a mass like structure present in the IVC. This prevents collpase of the IVC during the examination. Due to the patient's history of covid-19 infection and LE DVT, this likely represents a thrombus. RV size and function are normal.  2. Left ventricular ejection fraction, by visual estimation, is >75%. The left ventricle has hyperdynamic function. There is mildly increased left ventricular hypertrophy.  3. Small left ventricular internal cavity size.  4. The left ventricle has no regional wall motion abnormalities.  5. Global right ventricle has normal systolic function.The right ventricular size is normal. No increase in right ventricular wall thickness.  6. Left atrial size was normal.  7. Right atrial size was normal.  8. Presence of pericardial fat pad.  9. The pericardial effusion is circumferential. 10. Trivial pericardial effusion is present. 11. The mitral valve is grossly normal. No evidence of mitral valve regurgitation. 12. The tricuspid valve is grossly normal. Tricuspid valve regurgitation is trivial. 13. The aortic valve is grossly normal. Aortic valve regurgitation is not visualized. No evidence of aortic valve sclerosis or stenosis. 14. The pulmonic valve was grossly normal. Pulmonic valve regurgitation is not visualized. 15. TR signal is inadequate for assessing pulmonary artery systolic pressure. 16. 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 17. The  inferior vena cava is normal in size with <50% respiratory variability, suggesting right atrial pressure of 8 mmHg. FINDINGS  Left Ventricle: Left ventricular ejection fraction, by visual estimation, is >75%. The left ventricle has hyperdynamic function. The left ventricle  has no regional wall motion abnormalities. The left ventricular internal cavity size was the LV cavity size is small. There is mildly increased left ventricular hypertrophy. Concentric left ventricular hypertrophy. Left ventricular diastolic parameters are consistent with age-related delayed relaxation (normal). Right Ventricle: The right ventricular size is normal. No increase in right ventricular wall thickness. Global RV systolic function is has normal systolic function. Left Atrium: Left atrial size was normal in size. Right Atrium: Right atrial size was normal in size Pericardium: Trivial pericardial effusion is present. The pericardial effusion is circumferential. Presence of pericardial fat pad. Mitral Valve: The mitral valve is grossly normal. No evidence of mitral valve regurgitation. Tricuspid Valve: The tricuspid valve is grossly normal. Tricuspid valve regurgitation is trivial. Aortic Valve: The aortic valve is grossly normal. Aortic valve regurgitation is not visualized. The aortic valve is structurally normal, with no evidence of sclerosis or stenosis. Pulmonic Valve: The pulmonic valve was grossly normal. Pulmonic valve regurgitation is not visualized. Pulmonic regurgitation is not visualized. Aorta: The aortic root is normal in size and structure. Venous: The inferior vena cava is normal in size with less than 50% respiratory variability, suggesting right atrial pressure of 8 mmHg. There is mass detected in the inferior vena cava. IAS/Shunts: No atrial level shunt detected by color flow Doppler.  LEFT VENTRICLE PLAX 2D LVIDd:         2.67 cm       Diastology LVIDs:         1.94 cm       LV e' lateral:   7.29 cm/s LV PW:          1.58 cm       LV E/e' lateral: 7.6 LV IVS:        1.20 cm       LV e' medial:    5.22 cm/s LVOT diam:     1.60 cm       LV E/e' medial:  10.6 LV SV:         15 ml LV SV Index:   7.22 LVOT Area:     2.01 cm  LV Volumes (MOD) LV area d, A2C:    12.70 cm LV area d, A4C:    17.80 cm LV area s, A2C:    8.18 cm LV area s, A4C:    9.86 cm LV major d, A2C:   4.92 cm LV major d, A4C:   6.84 cm LV major s, A2C:   4.51 cm LV major s, A4C:   5.77 cm LV vol d, MOD A2C: 27.7 ml LV vol d, MOD A4C: 40.2 ml LV vol s, MOD A2C: 12.9 ml LV vol s, MOD A4C: 14.3 ml LV SV MOD A2C:     14.8 ml LV SV MOD A4C:     40.2 ml LV SV MOD BP:      24.0 ml RIGHT VENTRICLE             IVC RV S prime:     22.40 cm/s  IVC diam: 1.53 cm TAPSE (M-mode): 1.7 cm LEFT ATRIUM             Index       RIGHT ATRIUM           Index LA diam:        2.40 cm 1.24 cm/m  RA Area:     13.30 cm LA Vol (A2C):   22.5 ml 11.63 ml/m RA Volume:   34.80 ml  17.98  ml/m LA Vol (A4C):   27.4 ml 14.16 ml/m LA Biplane Vol: 26.4 ml 13.64 ml/m  AORTIC VALVE LVOT Vmax:   133.00 cm/s LVOT Vmean:  105.000 cm/s LVOT VTI:    0.211 m  AORTA Ao Root diam: 2.90 cm MITRAL VALVE MV Area (PHT): 2.39 cm             SHUNTS MV PHT:        91.93 msec           Systemic VTI:  0.21 m MV Decel Time: 317 msec             Systemic Diam: 1.60 cm MV E velocity: 55.30 cm/s 103 cm/s MV A velocity: 89.20 cm/s 70.3 cm/s MV E/A ratio:  0.62       1.5  Lennie Odor MD Electronically signed by Lennie Odor MD Signature Date/Time: 02/27/2019/5:55:17 PM    Final    CT VENOGRAM ABD/PEL  Result Date: 02/28/2019 CLINICAL DATA:  Cardiac thrombus or embolic source suspected. Concern for thrombus within the proximal IVC seen on ECHO. EXAM: CT CHEST, ABDOMEN, AND PELVIS WITH CONTRAST TECHNIQUE: Multidetector CT imaging of the chest, abdomen and pelvis was performed following the standard protocol during bolus administration of intravenous contrast. CONTRAST:  OMNIPAQUE IOHEXOL 350 MG/ML SOLN  COMPARISON:  None. FINDINGS: CT CHEST FINDINGS Cardiovascular: There is no large centrally located pulmonary embolism. The heart size is normal. There is no significant pericardial effusion. Coronary artery calcifications are noted. Minimal atherosclerotic changes are noted of the thoracic aorta without evidence for a thoracic aortic aneurysm. Mediastinum/Nodes: --there are prominent mediastinal and hilar lymph nodes, likely reactive. --No axillary lymphadenopathy. --No supraclavicular lymphadenopathy. --Normal thyroid gland. --The esophagus is unremarkable Lungs/Pleura: There are diffuse bilateral ground-glass airspace opacities involving all lobes to relatively equal degree. There is no pneumothorax. No significant pleural effusion. Musculoskeletal: No chest wall abnormality. No acute or significant osseous findings. Review of the MIP images confirms the above findings. CT ABDOMEN PELVIS FINDINGS Hepatobiliary: The liver is normal. Normal gallbladder.There is no biliary ductal dilation. Pancreas: Normal contours without ductal dilatation. No peripancreatic fluid collection. Spleen: No splenic laceration or hematoma. Adrenals/Urinary Tract: --Adrenal glands: No adrenal hemorrhage. --Right kidney/ureter: No hydronephrosis or perinephric hematoma. --Left kidney/ureter: No hydronephrosis or perinephric hematoma. --Urinary bladder: Unremarkable. Stomach/Bowel: --Stomach/Duodenum: No hiatal hernia or other gastric abnormality. Normal duodenal course and caliber. --Small bowel: No dilatation or inflammation. --Colon: No focal abnormality. --Appendix: Normal. Vascular/Lymphatic: Normal course and caliber of the major abdominal vessels. There is no evidence for a thrombus within the IVC. --No retroperitoneal lymphadenopathy. --No mesenteric lymphadenopathy. --No pelvic or inguinal lymphadenopathy. Reproductive: Unremarkable Other: No ascites or free air. 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 Musculoskeletal.  No acute displaced fractures. IMPRESSION: 1. No CT evidence for IVC thrombus. 2. Diffuse bilateral ground-glass airspace opacities consistent with the patient's history of viral pneumonia. 3. Prominent mediastinal and hilar lymph nodes, likely reactive. Aortic Atherosclerosis (ICD10-I70.0). Electronically Signed   By: Katherine Mantle M.D.   On: 02/28/2019 00:45   VAS [place] LOWER EXTREMITY VENOUS (DVT)  Result Date: 02/24/2019  Lower Venous Study Indications: Elevated Ddimer.  Risk Factors: COVID 19 positive. Comparison Study: No prior studies. Performing Technologist: Chanda Busing RVT  Examination Guidelines: A complete evaluation includes B-mode imaging, spectral Doppler, color Doppler, and power Doppler as needed of all accessible portions of each vessel. Bilateral testing is considered an integral part of a complete examination. Limited examinations for reoccurring indications may be performed  as noted.  +---------+---------------+---------+-----------+----------+--------------+ RIGHT    CompressibilityPhasicitySpontaneityPropertiesThrombus Aging +---------+---------------+---------+-----------+----------+--------------+ CFV      Full           Yes      Yes                                 +---------+---------------+---------+-----------+----------+--------------+ SFJ      Full                                                        +---------+---------------+---------+-----------+----------+--------------+ FV Prox  Full                                                        +---------+---------------+---------+-----------+----------+--------------+ FV Mid   Full                                                        +---------+---------------+---------+-----------+----------+--------------+ FV DistalFull                                                        +---------+---------------+---------+-----------+----------+--------------+ PFV      Full                                                         +---------+---------------+---------+-----------+----------+--------------+ POP      Full           Yes      Yes                                 +---------+---------------+---------+-----------+----------+--------------+ PTV      Partial                                      Acute          +---------+---------------+---------+-----------+----------+--------------+ PERO     Full                                                        +---------+---------------+---------+-----------+----------+--------------+   +---------+---------------+---------+-----------+----------+--------------+ LEFT     CompressibilityPhasicitySpontaneityPropertiesThrombus Aging +---------+---------------+---------+-----------+----------+--------------+ CFV      Full           Yes      Yes                                 +---------+---------------+---------+-----------+----------+--------------+  SFJ      Full                                                        +---------+---------------+---------+-----------+----------+--------------+ FV Prox  Full                                                        +---------+---------------+---------+-----------+----------+--------------+ FV Mid   Full                                                        +---------+---------------+---------+-----------+----------+--------------+ FV DistalFull                                                        +---------+---------------+---------+-----------+----------+--------------+ PFV      Full                                                        +---------+---------------+---------+-----------+----------+--------------+ POP      Full           Yes      Yes                                 +---------+---------------+---------+-----------+----------+--------------+ PTV      Full                                                         +---------+---------------+---------+-----------+----------+--------------+ PERO     Full                                                        +---------+---------------+---------+-----------+----------+--------------+     Summary: Right: Findings consistent with acute deep vein thrombosis involving the right posterior tibial veins. No cystic structure found in the popliteal fossa. Left: There is no evidence of deep vein thrombosis in the lower extremity. No cystic structure found in the popliteal fossa.  *See table(s) above for measurements and observations. Electronically signed by Waverly Ferrari MD on 02/24/2019 at 3:55:02 PM.    Final

## 2019-03-08 ENCOUNTER — Inpatient Hospital Stay (HOSPITAL_COMMUNITY): Payer: Medicaid Other

## 2019-03-08 DIAGNOSIS — E871 Hypo-osmolality and hyponatremia: Secondary | ICD-10-CM

## 2019-03-08 DIAGNOSIS — Z01818 Encounter for other preprocedural examination: Secondary | ICD-10-CM

## 2019-03-08 DIAGNOSIS — J1289 Other viral pneumonia: Secondary | ICD-10-CM

## 2019-03-08 DIAGNOSIS — U071 COVID-19: Principal | ICD-10-CM

## 2019-03-08 DIAGNOSIS — J9601 Acute respiratory failure with hypoxia: Secondary | ICD-10-CM

## 2019-03-08 LAB — COMPREHENSIVE METABOLIC PANEL
ALT: 34 U/L (ref 0–44)
AST: 30 U/L (ref 15–41)
Albumin: 2.6 g/dL — ABNORMAL LOW (ref 3.5–5.0)
Alkaline Phosphatase: 132 U/L — ABNORMAL HIGH (ref 38–126)
Anion gap: 14 (ref 5–15)
BUN: 46 mg/dL — ABNORMAL HIGH (ref 8–23)
CO2: 28 mmol/L (ref 22–32)
Calcium: 8.4 mg/dL — ABNORMAL LOW (ref 8.9–10.3)
Chloride: 91 mmol/L — ABNORMAL LOW (ref 98–111)
Creatinine, Ser: 0.76 mg/dL (ref 0.61–1.24)
GFR calc Af Amer: >60 mL/min (ref >60)
GFR calc non Af Amer: >60 mL/min (ref >60)
Glucose, Bld: 135 mg/dL — ABNORMAL HIGH (ref 70–99)
Potassium: 3.5 mmol/L (ref 3.5–5.1)
Sodium: 133 mmol/L — ABNORMAL LOW (ref 135–145)
Total Bilirubin: 1.2 mg/dL (ref 0.3–1.2)
Total Protein: 6.7 g/dL (ref 6.5–8.1)

## 2019-03-08 LAB — CBC
HCT: 54.1 % — ABNORMAL HIGH (ref 39.0–52.0)
Hemoglobin: 18.4 g/dL — ABNORMAL HIGH (ref 13.0–17.0)
MCH: 29.6 pg (ref 26.0–34.0)
MCHC: 34 g/dL (ref 30.0–36.0)
MCV: 87.1 fL (ref 80.0–100.0)
Platelets: 124 10*3/uL — ABNORMAL LOW (ref 150–400)
RBC: 6.21 MIL/uL — ABNORMAL HIGH (ref 4.22–5.81)
RDW: 13.1 % (ref 11.5–15.5)
WBC: 15.3 10*3/uL — ABNORMAL HIGH (ref 4.0–10.5)
nRBC: 0 % (ref 0.0–0.2)

## 2019-03-08 LAB — PHOSPHORUS: Phosphorus: 4.2 mg/dL (ref 2.5–4.6)

## 2019-03-08 LAB — C-REACTIVE PROTEIN: CRP: 0.8 mg/dL (ref <1.0)

## 2019-03-08 LAB — GLUCOSE, CAPILLARY
Glucose-Capillary: 153 mg/dL — ABNORMAL HIGH (ref 70–99)
Glucose-Capillary: 122 mg/dL — ABNORMAL HIGH (ref 70–99)
Glucose-Capillary: 228 mg/dL — ABNORMAL HIGH (ref 70–99)
Glucose-Capillary: 194 mg/dL — ABNORMAL HIGH (ref 70–99)
Glucose-Capillary: 158 mg/dL — ABNORMAL HIGH (ref 70–99)
Glucose-Capillary: 206 mg/dL — ABNORMAL HIGH (ref 70–99)

## 2019-03-08 LAB — MAGNESIUM: Magnesium: 2.5 mg/dL — ABNORMAL HIGH (ref 1.7–2.4)

## 2019-03-08 LAB — D-DIMER, QUANTITATIVE: D-Dimer, Quant: 1.51 ug/mL-FEU — ABNORMAL HIGH (ref 0.00–0.50)

## 2019-03-08 LAB — FERRITIN: Ferritin: 1292 ng/mL — ABNORMAL HIGH (ref 24–336)

## 2019-03-08 MED ORDER — MIDAZOLAM HCL 2 MG/2ML IJ SOLN
2.0000 mg | INTRAMUSCULAR | Status: DC | PRN
  Filled 2019-03-08: qty 2

## 2019-03-08 MED ORDER — MIDAZOLAM HCL 2 MG/2ML IJ SOLN
INTRAMUSCULAR | Status: AC
  Administered 2019-03-08: 19:00:00 2 mg via INTRAVENOUS
  Filled 2019-03-08: qty 4

## 2019-03-08 MED ORDER — ASCORBIC ACID 500 MG PO TABS
500.0000 mg | ORAL_TABLET | Freq: Every day | ORAL | Status: DC
  Administered 2019-03-09 – 2019-03-16 (×8): 500 mg
  Filled 2019-03-08 (×8): qty 1

## 2019-03-08 MED ORDER — ZINC SULFATE 220 (50 ZN) MG PO CAPS
220.0000 mg | ORAL_CAPSULE | Freq: Every day | ORAL | Status: DC
  Administered 2019-03-09 – 2019-03-16 (×8): 220 mg
  Filled 2019-03-08 (×9): qty 1

## 2019-03-08 MED ORDER — FENTANYL BOLUS VIA INFUSION
50.0000 ug | INTRAVENOUS | Status: DC | PRN
  Administered 2019-03-11 – 2019-03-13 (×4): 50 ug via INTRAVENOUS
  Filled 2019-03-08: qty 50

## 2019-03-08 MED ORDER — LACTATED RINGERS IV BOLUS
500.0000 mL | Freq: Once | INTRAVENOUS | Status: AC
  Administered 2019-03-08: 19:00:00 500 mL via INTRAVENOUS

## 2019-03-08 MED ORDER — MIDAZOLAM HCL 2 MG/2ML IJ SOLN: 2.0000 mg | Freq: Once | INTRAMUSCULAR | Status: AC

## 2019-03-08 MED ORDER — ETOMIDATE 2 MG/ML IV SOLN: 20.0000 mg | Freq: Once | INTRAVENOUS | Status: AC

## 2019-03-08 MED ORDER — FAMOTIDINE IN NACL 20-0.9 MG/50ML-% IV SOLN
20.0000 mg | Freq: Two times a day (BID) | INTRAVENOUS | Status: DC
  Administered 2019-03-08 – 2019-03-10 (×5): 20 mg via INTRAVENOUS
  Filled 2019-03-08 (×5): qty 50

## 2019-03-08 MED ORDER — ETOMIDATE 2 MG/ML IV SOLN
INTRAVENOUS | Status: AC
  Administered 2019-03-08: 19:00:00 20 mg via INTRAVENOUS
  Filled 2019-03-08: qty 10

## 2019-03-08 MED ORDER — ORAL CARE MOUTH RINSE
15.0000 mL | OROMUCOSAL | Status: DC
  Administered 2019-03-08 – 2019-03-17 (×86): 15 mL via OROMUCOSAL

## 2019-03-08 MED ORDER — VITAL HIGH PROTEIN PO LIQD
1000.0000 mL | ORAL | Status: AC
  Administered 2019-03-08 – 2019-03-09 (×2): 1000 mL

## 2019-03-08 MED ORDER — ACETAMINOPHEN 325 MG PO TABS
650.0000 mg | ORAL_TABLET | Freq: Four times a day (QID) | ORAL | Status: DC | PRN
  Administered 2019-03-10: 650 mg
  Filled 2019-03-08: qty 2

## 2019-03-08 MED ORDER — FENTANYL CITRATE (PF) 100 MCG/2ML IJ SOLN: 50.0000 ug | Freq: Once | INTRAMUSCULAR | Status: AC

## 2019-03-08 MED ORDER — MIDAZOLAM HCL 2 MG/2ML IJ SOLN
2.0000 mg | INTRAMUSCULAR | Status: DC | PRN
  Administered 2019-03-13 (×2): 2 mg via INTRAVENOUS
  Filled 2019-03-08: qty 2

## 2019-03-08 MED ORDER — ROSUVASTATIN CALCIUM 20 MG PO TABS
20.0000 mg | ORAL_TABLET | Freq: Every day | ORAL | Status: DC
  Administered 2019-03-09 – 2019-03-16 (×8): 20 mg
  Filled 2019-03-08 (×9): qty 1

## 2019-03-08 MED ORDER — DEXMEDETOMIDINE HCL IN NACL 400 MCG/100ML IV SOLN
0.0000 ug/kg/h | INTRAVENOUS | Status: DC
  Administered 2019-03-08 – 2019-03-09 (×2): 0.4 ug/kg/h via INTRAVENOUS
  Filled 2019-03-08 (×2): qty 100

## 2019-03-08 MED ORDER — FENTANYL 2500MCG IN NS 250ML (10MCG/ML) PREMIX INFUSION
0.0000 ug/h | INTRAVENOUS | Status: DC
  Administered 2019-03-08: 19:00:00 50 ug/h via INTRAVENOUS
  Administered 2019-03-09: 250 ug/h via INTRAVENOUS
  Administered 2019-03-09: 200 ug/h via INTRAVENOUS
  Administered 2019-03-10 – 2019-03-13 (×7): 250 ug/h via INTRAVENOUS
  Administered 2019-03-13: 11:00:00 350 ug/h via INTRAVENOUS
  Filled 2019-03-08 (×11): qty 250

## 2019-03-08 MED ORDER — VITAMIN D 25 MCG (1000 UNIT) PO TABS
1000.0000 [IU] | ORAL_TABLET | Freq: Every day | ORAL | Status: DC
  Administered 2019-03-09 – 2019-03-16 (×8): 1000 [IU]
  Filled 2019-03-08 (×10): qty 1

## 2019-03-08 MED ORDER — NOREPINEPHRINE 4 MG/250ML-% IV SOLN
INTRAVENOUS | Status: AC
  Filled 2019-03-08: qty 250

## 2019-03-08 MED ORDER — PRO-STAT SUGAR FREE PO LIQD
30.0000 mL | Freq: Two times a day (BID) | ORAL | Status: DC
  Administered 2019-03-08 – 2019-03-10 (×4): 30 mL
  Filled 2019-03-08 (×4): qty 30

## 2019-03-08 MED ORDER — ROCURONIUM BROMIDE 10 MG/ML (PF) SYRINGE
PREFILLED_SYRINGE | INTRAVENOUS | Status: AC
  Administered 2019-03-08: 19:00:00 80 mg via INTRAVENOUS
  Filled 2019-03-08: qty 10

## 2019-03-08 MED ORDER — NOREPINEPHRINE 4 MG/250ML-% IV SOLN
2.0000 ug/min | INTRAVENOUS | Status: DC
  Administered 2019-03-08: 22:00:00 2 ug/min via INTRAVENOUS
  Administered 2019-03-09: 10 ug/min via INTRAVENOUS
  Filled 2019-03-08: qty 250

## 2019-03-08 MED ORDER — CHLORHEXIDINE GLUCONATE 0.12% ORAL RINSE (MEDLINE KIT)
15.0000 mL | Freq: Two times a day (BID) | OROMUCOSAL | Status: DC
  Administered 2019-03-08 – 2019-03-10 (×5): 15 mL via OROMUCOSAL

## 2019-03-08 MED ORDER — NOREPINEPHRINE 4 MG/250ML-% IV SOLN: 0.0000 ug/min | INTRAVENOUS | Status: DC

## 2019-03-08 MED ORDER — FENTANYL CITRATE (PF) 100 MCG/2ML IJ SOLN
INTRAMUSCULAR | Status: AC
  Administered 2019-03-08: 19:00:00 50 ug via INTRAVENOUS
  Filled 2019-03-08: qty 2

## 2019-03-08 MED ORDER — ROCURONIUM BROMIDE 10 MG/ML (PF) SYRINGE: 80.0000 mg | PREFILLED_SYRINGE | Freq: Once | INTRAVENOUS | Status: AC

## 2019-03-08 MED ORDER — SODIUM CHLORIDE 0.9 % IV SOLN
250.0000 mL | INTRAVENOUS | Status: DC
  Administered 2019-03-08: 22:00:00 250 mL via INTRAVENOUS

## 2019-03-08 NOTE — Progress Notes (Signed)
PROGRESS NOTE                                                                                                                                                                                                             Patient Demographics:    Alexander Erickson, is a 63 y.o. male, DOB - 19-Jul-1955, ZOX:[id number]  Outpatient Primary MD for the patient is Patient, No Pcp Per   Admit date - 03/07/2019   LOS - 14  No chief complaint on file.      Brief Narrative: Patient is a 63 y.o. male with PMHx of HTN, HLD-who presented with 1 week history of progressive cough and shortness of breath-he was found to have acute hypoxemic respiratory failure in the setting of COVID-19 pneumonia and concurrent bacterial pneumonia along with severe hyponatremia.  Patient was admitted and started on steroids/remdesivir and empiric antibiotics.  Patient was also given Actemra.  Patient continued to have persistently worsening hypoxemia-requiring initiation of heated high flow oxygen.  Hospital course was complicated by development of right lower extremity DVT, and presumed HCAP on 12/17.  See below for further details.  Significant events: 11/30>> COVID test + 12/4>> admit to [facility] via [facility] ED 12/6>> Doppler positive for right lower extremity DVT 12/9>> transferred to ICU for heated high flow 12/9>> echo with no RV strain, but possible IVC thrombus 12/9>> CT venogram chest/abdomen/pelvis-no IVC thrombus 12/11>> transferred back to PCU-heated high flow continued  12/17>> suspected to have superimposed bacterial pneumonia on top of COVID-19 PNA  COVID-19 medications: Decadron 12/4 >12/17 Remdesivir 12/4 > 12/13 (10-day course) Actemra 12/6 x 1  Antibiotics: Vancomycin 12/17>> Cefepime 12/17>> Rocephin: 12/4>>12/8   Subjective:   He remains on heated high flow and 100% NRB.  Not in any distress.  Does not seem to have a lot of anxiety  today.  Denies any chest pain.  Still desaturates with minimal movement-takes around 20-25 minutes to regain saturation.   Assessment  & Plan :   Acute Hypoxic Resp Failure due to Covid 19 Viral pneumonia and concurrent bacterial pneumonia: Remains essentially unchanged for the past several days.  Suspect now in the fibroproliferative phase.  Still very hypoxic-requiring both heated high flow and 100% NRB to maintain O2 saturations.  Has completed a course of steroids and remdesivir.  Completed Rocephin on 12/8.  Due  to concern for HCAP on 12/17-has been started on vancomycin and cefepime.  Continue to monitor very closely-if deteriorates any further-we will require transfer to the ICU.  Have a long discussion with the patient-goals of care are to pursue full scope of treatment for now-including mechanical ventilation/intubation.  However he is not keen on long-term mechanical ventilation.   Fever: afebrile  O2 requirements:  SpO2: 92 % O2 Flow Rate (L/min): On both heated high flow and 100% NRB mask. FiO2 (%): 100 %   COVID-19 Labs: Recent Labs    03/06/19 0110 03/07/19 0240 03/08/19 0020  DDIMER 2.24* 2.00* 1.51*  FERRITIN 1,252* 1,287* 1,292*  CRP 0.6 0.5 0.8       Component Value Date/Time   BNP 53.0 03/02/2019 1234    Recent Labs  Lab 03/06/19 0110  PROCALCITON 0.13    Lab Results  Component Value Date   SARSCOV2NAA Detected (A) 02/18/2019     Other medications: Diuretics:Euvolemic-continue Lasix to maintain negative balance (-10.2 L so far) Insulin: CBG stable on SSI.  A1c 6.8-May need initiation of metformin on discharge.   Microbiology: 12/5>> sputum culture: Negative 12/10>> MRSA PCR negative 12/17>> MRSA PCR negative  Prone/Incentive Spirometry: encouraged patient to lie prone for 3-4 hours at a time for a total of 16 hours a day, and to encourage incentive spirometry use 3-4/hour  DVT Prophylaxis  :  Lovenox -therapeutic dosing  Right lower extremity  DVT with possible IVC clot seen on echo: Continue full dose Lovenox.  CT chest did not show a large central pulmonary thrombus.  CT venogram of the chest/abdomen and pelvis did not show any IVC clot,  discussed case with [facility]-Dr. Towanda Malkin at this time recommends continuing anticoagulation without the need for thrombolytics or any other vascular procedures.    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  Hyponatremia: Very mild-but significantly improved with supportive care.  Dyslipidemia: Continue statin  Prolonged QTC: Resolved.  Obesity: Estimated body mass index is 28.01 kg/m as calculated from the following:   Height as of this encounter: 5' 5.5" (1.664 m).   Weight as of this encounter: 77.5 kg.    GI prophylaxis: PPI  Consults  :  None  Procedures  :  None  ABG:    Component Value Date/Time   PHART 7.597 (H) 03/06/2019 2147   PCO2ART 34.3 03/06/2019 2147   PO2ART 41.0 (L) 03/06/2019 2147   HCO3 33.6 (H) 03/06/2019 2147   TCO2 35 (H) 03/06/2019 2147   O2SAT 86.0 03/06/2019 2147    Vent Settings: N/a  Condition - Extremely Guarded-very tenuous with risk for further deterioration  Family Communication  : Son on 12/18  Code Status :  Full Code  Diet :  Diet Order            Diet Carb Modified Fluid consistency: Thin; Room service appropriate? Yes  Diet effective now               Disposition Plan  :  Remain hospitalized  Barriers to discharge: Hypoxia requiring O2 supplementation  Antimicorbials  :    Anti-infectives (From admission, onward)   Start     Dose/Rate Route Frequency Ordered Stop   03/07/19 2200  vancomycin (VANCOCIN) IVPB 1000 mg/200 mL premix     1,000 mg 200 mL/hr over 60 Minutes Intravenous Every 12 hours 03/07/19 1217     03/07/19 0900  vancomycin (VANCOREADY) IVPB 1500 mg/300 mL     1,500 mg 150 mL/hr over 120  Minutes Intravenous  Once 03/07/19 0810 03/07/19 1229   03/07/19 0815  ceFEPIme (MAXIPIME) 2  g in sodium chloride 0.9 % 100 mL IVPB     2 g 200 mL/hr over 30 Minutes Intravenous Every 8 hours 03/07/19 0810     02/27/19 1500  remdesivir 100 mg in sodium chloride 0.9 % 100 mL IVPB    Note to Pharmacy: To complete a total of 10 days of therapy   100 mg 200 mL/hr over 30 Minutes Intravenous Daily 02/27/19 1320 03/03/19 1850   02/23/19 1000  remdesivir 100 mg in sodium chloride 0.9 % 100 mL IVPB  Status:  Discontinued     100 mg 200 mL/hr over 30 Minutes Intravenous Daily 2019-03-14 2057 14-Mar-2019 2102   02/23/19 1000  remdesivir 100 mg in sodium chloride 0.9 % 100 mL IVPB     100 mg 200 mL/hr over 30 Minutes Intravenous Daily 03-14-19 2104 02/26/19 0915   2019/03/14 2315  doxycycline (VIBRA-TABS) tablet 100 mg  Status:  Discontinued     100 mg Oral Every 12 hours 14-Mar-2019 2306 02/23/19 1657   03/14/2019 2100  remdesivir 200 mg in sodium chloride 0.9% 250 mL IVPB  Status:  Discontinued     200 mg 580 mL/hr over 30 Minutes Intravenous Once 14-Mar-2019 2057 03/14/19 2102   03/14/19 2100  cefTRIAXone (ROCEPHIN) 2 g in sodium chloride 0.9 % 100 mL IVPB     2 g 200 mL/hr over 30 Minutes Intravenous Every 24 hours 03/14/19 2058 02/26/19 2215   2019-03-14 2100  azithromycin (ZITHROMAX) tablet 500 mg  Status:  Discontinued     500 mg Oral Daily 2019-03-14 2058 2019-03-14 2306      Inpatient Medications  Scheduled Meds:  Chlorhexidine Gluconate Cloth  6 each Topical Daily   cholecalciferol  1,000 Units Oral Daily   enoxaparin (LOVENOX) injection  80 mg Subcutaneous Q12H   furosemide  40 mg Intravenous Daily   insulin aspart  0-9 Units Subcutaneous TID WC   mouth rinse  15 mL Mouth Rinse BID   metoprolol tartrate  12.5 mg Oral BID   pantoprazole  40 mg Oral Daily   rosuvastatin  20 mg Oral q1800   sodium chloride flush  3 mL Intravenous Q12H   vitamin C  500 mg Oral Daily   zinc sulfate  220 mg Oral Daily   Continuous Infusions:  ceFEPime (MAXIPIME) IV 2 g (03/08/19 5784)    vancomycin 1,000 mg (03/08/19 1104)   PRN Meds:.acetaminophen, albuterol, ALPRAZolam, HYDROcodone-acetaminophen, influenza vac split quadrivalent PF, labetalol, lip balm, oxymetazoline, phenol, sodium chloride   Time Spent in minutes 35   See all Orders from today for further details   Jeoffrey Massed M.D on 03/08/2019 at 4:08 PM  To page go to [email or web address] - use universal password  [facility] -  Office  [phone]    Objective:   Vitals:   03/08/19 1100 03/08/19 1200 03/08/19 1300 03/08/19 1400  BP: 104/72 (!) 126/53 123/77 134/85  Pulse: 92 94 93 93  Resp: (!) 22 (!) 25 (!) 24 (!) 27  Temp:  97.6 F (36.4 C)    TempSrc:  Axillary    SpO2: 99% 96% 93% 96%  Weight:      Height:        Wt Readings from Last 3 Encounters:  03/03/19 77.5 kg  2019-03-14 86.2 kg     Intake/Output Summary (Last 24 hours) at 03/08/2019 1608 Last data filed at 03/08/2019 1200 Gross per 24  hour  Intake 1397.92 ml  Output 2325 ml  Net -927.08 ml     Physical Exam Gen Exam:Alert awake-mildly tachypneic after completing a long sentence-but not in obvious distress. HEENT:atraumatic, normocephalic Chest: B/L clear to auscultation anteriorly CVS:S1S2 regular Abdomen:soft non tender, non distended Extremities:no edema Neurology: Non focal Skin: no rash   Data Review:    CBC Recent Labs  Lab 03/04/19 0450 03/05/19 0410 03/06/19 0110 03/06/19 2147 03/07/19 0240 03/08/19 0020  WBC 13.3* 12.7* 13.7*  --  16.0* 15.3*  HGB 18.3* 18.1* 18.3* 19.0* 19.0* 18.4*  HCT 54.6* 54.4* 54.2* 56.0* 56.4* 54.1*  PLT 129* 129* 133*  --  132* 124*  MCV 89.1 88.7 88.6  --  88.1 87.1  MCH 29.9 29.5 29.9  --  29.7 29.6  MCHC 33.5 33.3 33.8  --  33.7 34.0  RDW 13.0 13.2 13.1  --  13.3 13.1    Chemistries  Recent Labs  Lab 03/02/19 0316 03/04/19 0450 03/05/19 0410 03/06/19 0110 03/06/19 2147 03/07/19 0240 03/08/19 0020  NA 133* 138 135 136 130* 133* 133*  K 4.4 4.5 4.7 4.1 4.1  3.9 3.5  CL 91* 92* 89* 89*  --  87* 91*  CO2 29 34* 33* 30  --  30 28  GLUCOSE 110* 101* 129* 129*  --  103* 135*  BUN 40* 40* 44* 44*  --  49* 46*  CREATININE 0.93 0.92 0.86 0.97  --  0.87 0.76  CALCIUM 8.4* 8.6* 8.7* 8.5*  --  8.6* 8.4*  MG 2.3  --   --  2.3  --   --   --   AST 34 37 36 34  --  35 30  ALT 23 33 40 40  --  44 34  ALKPHOS 180* 171* 161* 153*  --  150* 132*  BILITOT 1.2 1.0 0.8 1.0  --  1.0 1.2   ------------------------------------------------------------------------------------------------------------------ No results for input(s): CHOL, HDL, LDLCALC, TRIG, CHOLHDL, LDLDIRECT in the last 72 hours.  Lab Results  Component Value Date   HGBA1C 6.8 (H) 02/27/2019   ------------------------------------------------------------------------------------------------------------------ No results for input(s): TSH, T4TOTAL, T3FREE, THYROIDAB in the last 72 hours.  Invalid input(s): FREET3 ------------------------------------------------------------------------------------------------------------------ Recent Labs    03/07/19 0240 03/08/19 0020  FERRITIN 1,287* 1,292*    Coagulation profile No results for input(s): INR, PROTIME in the last 168 hours.  Recent Labs    03/07/19 0240 03/08/19 0020  DDIMER 2.00* 1.51*    Cardiac Enzymes No results for input(s): CKMB, TROPONINI, MYOGLOBIN in the last 168 hours.  Invalid input(s): CK ------------------------------------------------------------------------------------------------------------------    Component Value Date/Time   BNP 53.0 02-23-2019 1234    Micro Results Recent Results (from the past 240 hour(s))  MRSA PCR Screening     Status: None   Collection Time: 02/28/19  4:31 AM   Specimen: Nasal Mucosa; Nasopharyngeal  Result Value Ref Range Status   MRSA by PCR NEGATIVE NEGATIVE Final    Comment:        The GeneXpert MRSA Assay (FDA approved for NASAL specimens only), is one component of  a comprehensive MRSA colonization surveillance program. It is not intended to diagnose MRSA infection nor to guide or monitor treatment for MRSA infections. Performed at [facility], [address], [place], [place] [address]   MRSA PCR Screening     Status: None   Collection Time: 03/07/19  4:00 PM   Specimen: Nasal Mucosa; Nasopharyngeal  Result Value Ref Range Status  MRSA by PCR NEGATIVE NEGATIVE Final    Comment:        The GeneXpert MRSA Assay (FDA approved for NASAL specimens only), is one component of a comprehensive MRSA colonization surveillance program. It is not intended to diagnose MRSA infection nor to guide or monitor treatment for MRSA infections. Performed at [facility], [address], [place], [place] [address]     Radiology Reports CT CHEST W CONTRAST  Result Date: 02/28/2019 CLINICAL DATA:  Cardiac thrombus or embolic source suspected. Concern for thrombus within the proximal IVC seen on ECHO. EXAM: CT CHEST, ABDOMEN, AND PELVIS WITH CONTRAST TECHNIQUE: Multidetector CT imaging of the chest, abdomen and pelvis was performed following the standard protocol during bolus administration of intravenous contrast. CONTRAST:  OMNIPAQUE IOHEXOL 350 MG/ML SOLN COMPARISON:  None. FINDINGS: CT CHEST FINDINGS Cardiovascular: There is no large centrally located pulmonary embolism. The heart size is normal. There is no significant pericardial effusion. Coronary artery calcifications are noted. Minimal atherosclerotic changes are noted of the thoracic aorta without evidence for a thoracic aortic aneurysm. Mediastinum/Nodes: --there are prominent mediastinal and hilar lymph nodes, likely reactive. --No axillary lymphadenopathy. --No supraclavicular lymphadenopathy. --Normal thyroid gland. --The esophagus is unremarkable Lungs/Pleura: There are diffuse bilateral ground-glass airspace opacities involving all lobes to relatively equal  degree. There is no pneumothorax. No significant pleural effusion. Musculoskeletal: No chest wall abnormality. No acute or significant osseous findings. Review of the MIP images confirms the above findings. CT ABDOMEN PELVIS FINDINGS Hepatobiliary: The liver is normal. Normal gallbladder.There is no biliary ductal dilation. Pancreas: Normal contours without ductal dilatation. No peripancreatic fluid collection. Spleen: No splenic laceration or hematoma. Adrenals/Urinary Tract: --Adrenal glands: No adrenal hemorrhage. --Right kidney/ureter: No hydronephrosis or perinephric hematoma. --Left kidney/ureter: No hydronephrosis or perinephric hematoma. --Urinary bladder: Unremarkable. Stomach/Bowel: --Stomach/Duodenum: No hiatal hernia or other gastric abnormality. Normal duodenal course and caliber. --Small bowel: No dilatation or inflammation. --Colon: No focal abnormality. --Appendix: Normal. Vascular/Lymphatic: Normal course and caliber of the major abdominal vessels. There is no evidence for a thrombus within the IVC. --No retroperitoneal lymphadenopathy. --No mesenteric lymphadenopathy. --No pelvic or inguinal lymphadenopathy. Reproductive: Unremarkable Other: No ascites or free air. 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 Musculoskeletal. No acute displaced fractures. IMPRESSION: 1. No CT evidence for IVC thrombus. 2. Diffuse bilateral ground-glass airspace opacities consistent with the patient's history of viral pneumonia. 3. Prominent mediastinal and hilar lymph nodes, likely reactive. Aortic Atherosclerosis (ICD10-I70.0). Electronically Signed   By: Katherine Mantle M.D.   On: 02/28/2019 00:45   DG Chest Port AM  Result Date: 03/07/2019 CLINICAL DATA:  COVID infection EXAM: PORTABLE CHEST 1 VIEW COMPARISON:  03/02/2019; 02/25/2019; chest CT-02/27/2019 FINDINGS: Grossly unchanged cardiac silhouette and mediastinal contours given persistently reduced lung volumes. There is persistent thickening  the right paratracheal stripe presumably secondary prominent vasculature. Worsening diffuse bilateral interstitial opacities and consolidative opacities involving the left mid and lower lung with suspected retrocardiac air bronchograms. No definite pleural effusion. No definite evidence of edema. No acute osseous abnormalities. IMPRESSION: Worsening bilateral interstitial opacities and consolidative opacities involving the left mid and lower lung, nonspecific though worrisome for progression of atypical infection, though note, underlying edema not excluded. Electronically Signed   By: Simonne Come M.D.   On: 03/07/2019 08:19   DG Chest Port 1 View  Result Date: 02/25/2019 CLINICAL DATA:  63 year old male COVID-19 pneumonia. EXAM: PORTABLE CHEST 1 VIEW COMPARISON:  March 13, 2019 and earlier. FINDINGS: Portable AP semi upright view at 0648 hours.  Lower lung volumes. Stable cardiac size and mediastinal contours. Visualized tracheal air column is within normal limits. Coarse and interstitial bilateral pulmonary opacity. Some improved ventilation at the right lung base. Most confluent opacity now remains at the left lung base. No superimposed pneumothorax or effusion. Paucity of bowel gas in the upper abdomen. No acute osseous abnormality identified. IMPRESSION: 1. Continued bilateral pneumonia with improved ventilation at the right lung base since 03/16/2019. 2. No new cardiopulmonary abnormality. Electronically Signed   By: Odessa Fleming M.D.   On: 02/25/2019 07:57   DG Chest Portable 1 View  Result Date: 2019/03/16 CLINICAL DATA:  Shortness of breath, COVID positive EXAM: PORTABLE CHEST 1 VIEW COMPARISON:  None. FINDINGS: Cardiomediastinal contours are normal. There is diffuse interstitial and airspace opacity throughout both the right and left chest. No signs of dense consolidation or evidence of pleural effusion. No acute bone finding. IMPRESSION: Diffuse interstitial and airspace opacity throughout both lungs.  Findings which could be seen in viral or atypical pneumonia, also with COVID-19 infection. Asymmetric pulmonary edema could have similar appearance. Electronically Signed   By: Donzetta Kohut M.D.   On: 03/16/19 13:01   DG Chest Port 1V today  Result Date: 03/08/2019 CLINICAL DATA:  Shortness of breath. COVID 19 pneumonia. EXAM: PORTABLE CHEST 1 VIEW COMPARISON:  Radiographs 03/07/2019 and 03/02/2019. FINDINGS: 1436 hours. The heart size and mediastinal contours are stable. There are persistent low lung volumes with diffuse bilateral interstitial pulmonary opacities. The retrocardiac consolidation in the left lower lobe has mildly improved. No pneumothorax or significant pleural effusion. The bones appear unchanged. Telemetry leads overlie the chest. IMPRESSION: 1. Mildly improved left lower lobe consolidation. 2. Persistent diffuse interstitial pulmonary opacities consistent with viral pneumonia. Electronically Signed   By: Carey Bullocks M.D.   On: 03/08/2019 14:45   DG Chest Port 1V today  Result Date: 03/02/2019 CLINICAL DATA:  Shortness of breath. Follow-up for COVID-19 pneumonia. EXAM: PORTABLE CHEST 1 VIEW COMPARISON:  02/25/2019 and earlier studies. FINDINGS: Hazy bilateral airspace lung opacities are without significant change from the most recent prior exam. No new lung abnormalities. No evidence of a pleural effusion and no pneumothorax. IMPRESSION: 1. No significant change from the most recent prior study. Persistent bilateral hazy airspace lung opacities consistent with COVID-19 pneumonia, with possible superimposed ARDS. Electronically Signed   By: Amie Portland M.D.   On: 03/02/2019 13:12   ECHOCARDIOGRAM COMPLETE  Result Date: 02/27/2019   ECHOCARDIOGRAM REPORT   Patient Name:   ZEPHYR RIDLEY Date of Exam: 02/27/2019 Medical Rec #:  [id number]    Height:       65.5 in Accession #:    [id number]   Weight:       187.4 lb Date of Birth:  May 24, 1955   BSA:          1.94 m Patient Age:     63 years     BP:           109/71 mmHg Patient Gender: M            HR:           102 bpm. Exam Location:  Inpatient Procedure: 2D Echo, Cardiac Doppler and Color Doppler Indications:    Acute respiratory failure.  History:        Patient has no prior history of Echocardiogram examinations.                 Abnormal ECG. Covid 19 positive. Hypoxia. Pneumonia.  Sonographer:  Sheralyn Boatman RDCS Referring Phys: [id number] [facility]  Sonographer Comments: Technically difficult study due to poor echo windows. IMPRESSIONS  1. There is a mass like structure present in the IVC. This prevents collpase of the IVC during the examination. Due to the patient's history of covid-19 infection and LE DVT, this likely represents a thrombus. RV size and function are normal.  2. Left ventricular ejection fraction, by visual estimation, is >75%. The left ventricle has hyperdynamic function. There is mildly increased left ventricular hypertrophy.  3. Small left ventricular internal cavity size.  4. The left ventricle has no regional wall motion abnormalities.  5. Global right ventricle has normal systolic function.The right ventricular size is normal. No increase in right ventricular wall thickness.  6. Left atrial size was normal.  7. Right atrial size was normal.  8. Presence of pericardial fat pad.  9. The pericardial effusion is circumferential. 10. Trivial pericardial effusion is present. 11. The mitral valve is grossly normal. No evidence of mitral valve regurgitation. 12. The tricuspid valve is grossly normal. Tricuspid valve regurgitation is trivial. 13. The aortic valve is grossly normal. Aortic valve regurgitation is not visualized. No evidence of aortic valve sclerosis or stenosis. 14. The pulmonic valve was grossly normal. Pulmonic valve regurgitation is not visualized. 15. TR signal is inadequate for assessing pulmonary artery systolic pressure. 16. 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 17. The inferior vena cava is normal in  size with <50% respiratory variability, suggesting right atrial pressure of 8 mmHg. FINDINGS  Left Ventricle: Left ventricular ejection fraction, by visual estimation, is >75%. The left ventricle has hyperdynamic function. The left ventricle has no regional wall motion abnormalities. The left ventricular internal cavity size was the LV cavity size is small. There is mildly increased left ventricular hypertrophy. Concentric left ventricular hypertrophy. Left ventricular diastolic parameters are consistent with age-related delayed relaxation (normal). Right Ventricle: The right ventricular size is normal. No increase in right ventricular wall thickness. Global RV systolic function is has normal systolic function. Left Atrium: Left atrial size was normal in size. Right Atrium: Right atrial size was normal in size Pericardium: Trivial pericardial effusion is present. The pericardial effusion is circumferential. Presence of pericardial fat pad. Mitral Valve: The mitral valve is grossly normal. No evidence of mitral valve regurgitation. Tricuspid Valve: The tricuspid valve is grossly normal. Tricuspid valve regurgitation is trivial. Aortic Valve: The aortic valve is grossly normal. Aortic valve regurgitation is not visualized. The aortic valve is structurally normal, with no evidence of sclerosis or stenosis. Pulmonic Valve: The pulmonic valve was grossly normal. Pulmonic valve regurgitation is not visualized. Pulmonic regurgitation is not visualized. Aorta: The aortic root is normal in size and structure. Venous: The inferior vena cava is normal in size with less than 50% respiratory variability, suggesting right atrial pressure of 8 mmHg. There is mass detected in the inferior vena cava. IAS/Shunts: No atrial level shunt detected by color flow Doppler.  LEFT VENTRICLE PLAX 2D LVIDd:         2.67 cm       Diastology LVIDs:         1.94 cm       LV e' lateral:   7.29 cm/s LV PW:         1.58 cm       LV E/e' lateral: 7.6  LV IVS:        1.20 cm       LV e' medial:    5.22  cm/s LVOT diam:     1.60 cm       LV E/e' medial:  10.6 LV SV:         15 ml LV SV Index:   7.22 LVOT Area:     2.01 cm  LV Volumes (MOD) LV area d, A2C:    12.70 cm LV area d, A4C:    17.80 cm LV area s, A2C:    8.18 cm LV area s, A4C:    9.86 cm LV major d, A2C:   4.92 cm LV major d, A4C:   6.84 cm LV major s, A2C:   4.51 cm LV major s, A4C:   5.77 cm LV vol d, MOD A2C: 27.7 ml LV vol d, MOD A4C: 40.2 ml LV vol s, MOD A2C: 12.9 ml LV vol s, MOD A4C: 14.3 ml LV SV MOD A2C:     14.8 ml LV SV MOD A4C:     40.2 ml LV SV MOD BP:      24.0 ml RIGHT VENTRICLE             IVC RV S prime:     22.40 cm/s  IVC diam: 1.53 cm TAPSE (M-mode): 1.7 cm LEFT ATRIUM             Index       RIGHT ATRIUM           Index LA diam:        2.40 cm 1.24 cm/m  RA Area:     13.30 cm LA Vol (A2C):   22.5 ml 11.63 ml/m RA Volume:   34.80 ml  17.98 ml/m LA Vol (A4C):   27.4 ml 14.16 ml/m LA Biplane Vol: 26.4 ml 13.64 ml/m  AORTIC VALVE LVOT Vmax:   133.00 cm/s LVOT Vmean:  105.000 cm/s LVOT VTI:    0.211 m  AORTA Ao Root diam: 2.90 cm MITRAL VALVE MV Area (PHT): 2.39 cm             SHUNTS MV PHT:        91.93 msec           Systemic VTI:  0.21 m MV Decel Time: 317 msec             Systemic Diam: 1.60 cm MV E velocity: 55.30 cm/s 103 cm/s MV A velocity: 89.20 cm/s 70.3 cm/s MV E/A ratio:  0.62       1.5  Eleonore Chiquito MD Electronically signed by Eleonore Chiquito MD Signature Date/Time: 02/27/2019/5:55:17 PM    Final    CT VENOGRAM ABD/PEL  Result Date: 02/28/2019 CLINICAL DATA:  Cardiac thrombus or embolic source suspected. Concern for thrombus within the proximal IVC seen on ECHO. EXAM: CT CHEST, ABDOMEN, AND PELVIS WITH CONTRAST TECHNIQUE: Multidetector CT imaging of the chest, abdomen and pelvis was performed following the standard protocol during bolus administration of intravenous contrast. CONTRAST:  161mL OMNIPAQUE IOHEXOL 350 MG/ML SOLN COMPARISON:  None. FINDINGS: CT CHEST  FINDINGS Cardiovascular: There is no large centrally located pulmonary embolism. The heart size is normal. There is no significant pericardial effusion. Coronary artery calcifications are noted. Minimal atherosclerotic changes are noted of the thoracic aorta without evidence for a thoracic aortic aneurysm. Mediastinum/Nodes: --there are prominent mediastinal and hilar lymph nodes, likely reactive. --No axillary lymphadenopathy. --No supraclavicular lymphadenopathy. --Normal thyroid gland. --The esophagus is unremarkable Lungs/Pleura: There are diffuse bilateral ground-glass airspace opacities involving all lobes to relatively equal degree. There is no pneumothorax. No significant  pleural effusion. Musculoskeletal: No chest wall abnormality. No acute or significant osseous findings. Review of the MIP images confirms the above findings. CT ABDOMEN PELVIS FINDINGS Hepatobiliary: The liver is normal. Normal gallbladder.There is no biliary ductal dilation. Pancreas: Normal contours without ductal dilatation. No peripancreatic fluid collection. Spleen: No splenic laceration or hematoma. Adrenals/Urinary Tract: --Adrenal glands: No adrenal hemorrhage. --Right kidney/ureter: No hydronephrosis or perinephric hematoma. --Left kidney/ureter: No hydronephrosis or perinephric hematoma. --Urinary bladder: Unremarkable. Stomach/Bowel: --Stomach/Duodenum: No hiatal hernia or other gastric abnormality. Normal duodenal course and caliber. --Small bowel: No dilatation or inflammation. --Colon: No focal abnormality. --Appendix: Normal. Vascular/Lymphatic: Normal course and caliber of the major abdominal vessels. There is no evidence for a thrombus within the IVC. --No retroperitoneal lymphadenopathy. --No mesenteric lymphadenopathy. --No pelvic or inguinal lymphadenopathy. Reproductive: Unremarkable Other: No ascites or free air. 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 Musculoskeletal. No acute displaced fractures.  IMPRESSION: 1. No CT evidence for IVC thrombus. 2. Diffuse bilateral ground-glass airspace opacities consistent with the patient's history of viral pneumonia. 3. Prominent mediastinal and hilar lymph nodes, likely reactive. Aortic Atherosclerosis (ICD10-I70.0). Electronically Signed   By: Katherine Mantle M.D.   On: 02/28/2019 00:45   VAS [place] LOWER EXTREMITY VENOUS (DVT)  Result Date: 02/24/2019  Lower Venous Study Indications: Elevated Ddimer.  Risk Factors: COVID 19 positive. Comparison Study: No prior studies. Performing Technologist: Chanda Busing RVT  Examination Guidelines: A complete evaluation includes B-mode imaging, spectral Doppler, color Doppler, and power Doppler as needed of all accessible portions of each vessel. Bilateral testing is considered an integral part of a complete examination. Limited examinations for reoccurring indications may be performed as noted.  +---------+---------------+---------+-----------+----------+--------------+  RIGHT     Compressibility Phasicity Spontaneity Properties Thrombus Aging  +---------+---------------+---------+-----------+----------+--------------+  CFV       Full            Yes       Yes                                    +---------+---------------+---------+-----------+----------+--------------+  SFJ       Full                                                             +---------+---------------+---------+-----------+----------+--------------+  FV Prox   Full                                                             +---------+---------------+---------+-----------+----------+--------------+  FV Mid    Full                                                             +---------+---------------+---------+-----------+----------+--------------+  FV Distal Full                                                             +---------+---------------+---------+-----------+----------+--------------+  PFV       Full                                                              +---------+---------------+---------+-----------+----------+--------------+  POP       Full            Yes       Yes                                    +---------+---------------+---------+-----------+----------+--------------+  PTV       Partial                                          Acute           +---------+---------------+---------+-----------+----------+--------------+  PERO      Full                                                             +---------+---------------+---------+-----------+----------+--------------+   +---------+---------------+---------+-----------+----------+--------------+  LEFT      Compressibility Phasicity Spontaneity Properties Thrombus Aging  +---------+---------------+---------+-----------+----------+--------------+  CFV       Full            Yes       Yes                                    +---------+---------------+---------+-----------+----------+--------------+  SFJ       Full                                                             +---------+---------------+---------+-----------+----------+--------------+  FV Prox   Full                                                             +---------+---------------+---------+-----------+----------+--------------+  FV Mid    Full                                                             +---------+---------------+---------+-----------+----------+--------------+  FV Distal Full                                                             +---------+---------------+---------+-----------+----------+--------------+  PFV       Full                                                             +---------+---------------+---------+-----------+----------+--------------+  POP       Full            Yes       Yes                                    +---------+---------------+---------+-----------+----------+--------------+  PTV       Full                                                              +---------+---------------+---------+-----------+----------+--------------+  PERO      Full                                                             +---------+---------------+---------+-----------+----------+--------------+     Summary: Right: Findings consistent with acute deep vein thrombosis involving the right posterior tibial veins. No cystic structure found in the popliteal fossa. Left: There is no evidence of deep vein thrombosis in the lower extremity. No cystic structure found in the popliteal fossa.  *See table(s) above for measurements and observations. Electronically signed by Waverly Ferrarihristopher Dickson MD on 02/24/2019 at 3:55:02 PM.    Final

## 2019-03-08 NOTE — Progress Notes (Signed)
At 1800 Alexander Erickson was getting set up for dinner when he coughed up a bloody clot quarter size. His O2 saturation dropped from 96% to 74%. He was able to climb up to 85% but did not get higher then a sustained 86%. RN called RT to discuss Alexander Erickson status his O2 was increased from 35% on HFNC to 40%. He still was anxious and it was discussed to give him his anxiety medication to see if he would relax a bit. RT came to the room and discussion was held to get the ICU charge to come see pt along with the pulmonologist. Alexander Erickson was advised to go to ICU and get placed on the ventilator. He agreed to the plan and his son Alexander Erickson was called. Alexander Erickson was able to talk to his son during our transfer to the ICU. Report was given to [facility] and the team was prepared and ready for intubation.

## 2019-03-08 NOTE — Progress Notes (Signed)
Rapid Response Event Note  Overview: Bedside RN requested for RRT to assess patient related to hemoptysis and desaturation. Dr. Lake Bells called to assess patient. Decision was made to transport the patient to the ICU and perform intubation. Patient was verbally consented by Dr. Lake Bells prior to procedure. All questions welcomed and answered. Patient was able to speak to his family prior to intubation.         Alexander Erickson

## 2019-03-08 NOTE — Progress Notes (Signed)
NAME:  Alexander Erickson, MRN:  [id number], DOB:  1955-11-03, LOS: 14 ADMISSION DATE:  03-14-19, CONSULTATION DATE:  12/18 REFERRING MD:  Jerral Ralph, CHIEF COMPLAINT:  dyspnea   Brief History   63 y./o male admited with acute respiratory failure with hypoxemia due to COVID pneumonia, treated with heated high flow for days; developed hemoptysis and worsening respiratory failure requiring intubation on 12/18.  History of present illness   63 y/o male with acute respiratory failure with hypoxemia due to COVID pneumonia. Was admitted, treated with decadron, remdesivir, actemra.  There was concern for an IVC clot at one point.  This was ruled out with CT venogram.  He continued to be treated with heated high flow O2. Developed hemoptysis and worsening dyspnea/hypoxemia on 12/18 requiring intubation per his stated wishes.    Past Medical History  Hypertension Hyperlipidemia  Significant Hospital Events   11/30>> COVID test + 12/4>> admit to [facility] via [facility] ED 12/9>> transferred to ICU for heated high flow 12/11>> transferred back to PCU-heated high flow continued  12/17>> suspected to have superimposed bacterial pneumonia on top of COVID-19   Consults:  PCCM  Procedures:  12/18 ETT >   Significant Diagnostic Tests:  12/6>> Doppler positive for right lower extremity DVT 12/9>> echo with no RV strain, but possible IVC thrombus 12/9>> CT venogram chest/abdomen/pelvis-no IVC thrombus  Micro Data:  12/5 Resp culture>OPF  Antimicrobials:  COVID-19 medications: Decadron 12/4 >12/17 Remdesivir 12/4 > 12/13 (10-day course) Actemra 12/6 x 1  Antibiotics: Vancomycin 12/17>> Cefepime 12/17>> Rocephin: 12/4>>12/8   Interim history/subjective:  As above  Objective   Blood pressure 129/83, pulse 94, temperature 97.9 F (36.6 C), temperature source Axillary, resp. rate (!) 38, height 5' 5.5" (1.664 m), weight 77.5 kg, SpO2 (!) 86 %.    FiO2 (%):  [100 %] 100 %   Intake/Output  Summary (Last 24 hours) at 03/08/2019 1838 Last data filed at 03/08/2019 1800 Gross per 24 hour  Intake 1557.92 ml  Output 2350 ml  Net -792.08 ml   Filed Weights   2019/03/14 2000 02/24/19 0532 03/03/19 0929  Weight: 85 kg 85 kg 77.5 kg    Examination: General:  Severe respiratory distress in bed HENT: NCAT OP clear PULM: Rhonchi B, increased effort CV: Tachy, intact radial pulses GI: soft, nontender MSK: normal bulk and tone Neuro: awake, alert, no distress, MAEW  12/18 LLL consolidation, bilateral interstitial infiltrates  Resolved Hospital Problem list     Assessment & Plan:  COVID 19 pneumonia > severe acute worsening with hemoptysis Intubate now Continue mechanical ventilation per ARDS protocol Target TVol 6-8cc/kgIBW Target Plateau Pressure < 30cm H20 Target driving pressure less than 15 cm of water Target PaO2 55-65: titrate PEEP/FiO2 per protocol As long as PaO2 to FiO2 ratio is less than 1:150 position in prone position for 16 hours a day Check CVP daily if CVL in place Target CVP less than 4, diurese as necessary Ventilator associated pneumonia prevention protocol  Hemoptysis> most likely related to lung infection and anticoagulation Consider CT chest vs bronchoscopy Hold anticoagulation overnight  DVT: Hold lovenox overnight 12/18  Need for sedation for intubation PAD protocol RASS goal -2 Fetanyl, precedex  HCAP Cefepime  Best practice:  Diet: tube feeding Pain/Anxiety/Delirium protocol (if indicated): yes as above VAP protocol (if indicated): yes DVT prophylaxis: lovenox GI prophylaxis: famotidine Glucose control: SSI Mobility: bed rest Code Status: full Family Communication: nurses updated son 12/18 Disposition:   Labs   CBC: Recent Labs  Lab  03/04/19 0450 03/05/19 0410 03/06/19 0110 03/06/19 2147 03/07/19 0240 03/08/19 0020  WBC 13.3* 12.7* 13.7*  --  16.0* 15.3*  HGB 18.3* 18.1* 18.3* 19.0* 19.0* 18.4*  HCT 54.6* 54.4* 54.2*  56.0* 56.4* 54.1*  MCV 89.1 88.7 88.6  --  88.1 87.1  PLT 129* 129* 133*  --  132* 124*    Basic Metabolic Panel: Recent Labs  Lab 03/02/19 0316 03/04/19 0450 03/05/19 0410 03/06/19 0110 03/06/19 2147 03/07/19 0240 03/08/19 0020  NA 133* 138 135 136 130* 133* 133*  K 4.4 4.5 4.7 4.1 4.1 3.9 3.5  CL 91* 92* 89* 89*  --  87* 91*  CO2 29 34* 33* 30  --  30 28  GLUCOSE 110* 101* 129* 129*  --  103* 135*  BUN 40* 40* 44* 44*  --  49* 46*  CREATININE 0.93 0.92 0.86 0.97  --  0.87 0.76  CALCIUM 8.4* 8.6* 8.7* 8.5*  --  8.6* 8.4*  MG 2.3  --   --  2.3  --   --   --    GFR: Estimated Creatinine Clearance: 90.5 mL/min (by C-G formula based on SCr of 0.76 mg/dL). Recent Labs  Lab 03/05/19 0410 03/06/19 0110 03/07/19 0240 03/08/19 0020  PROCALCITON  --  0.13  --   --   WBC 12.7* 13.7* 16.0* 15.3*    Liver Function Tests: Recent Labs  Lab 03/04/19 0450 03/05/19 0410 03/06/19 0110 03/07/19 0240 03/08/19 0020  AST 37 36 34 35 30  ALT 33 40 40 44 34  ALKPHOS 171* 161* 153* 150* 132*  BILITOT 1.0 0.8 1.0 1.0 1.2  PROT 7.0 6.8 6.9 7.0 6.7  ALBUMIN 2.6* 2.6* 2.7* 2.6* 2.6*   No results for input(s): LIPASE, AMYLASE in the last 168 hours. No results for input(s): AMMONIA in the last 168 hours.  ABG    Component Value Date/Time   PHART 7.597 (H) 03/06/2019 2147   PCO2ART 34.3 03/06/2019 2147   PO2ART 41.0 (L) 03/06/2019 2147   HCO3 33.6 (H) 03/06/2019 2147   TCO2 35 (H) 03/06/2019 2147   O2SAT 86.0 03/06/2019 2147     Coagulation Profile: No results for input(s): INR, PROTIME in the last 168 hours.  Cardiac Enzymes: No results for input(s): CKTOTAL, CKMB, CKMBINDEX, TROPONINI in the last 168 hours.  HbA1C: Hgb A1c MFr Bld  Date/Time Value Ref Range Status  02/27/2019 03:15 AM 6.8 (H) 4.8 - 5.6 % Final    Comment:    (NOTE) Pre diabetes:          5.7%-6.4% Diabetes:              >6.4% Glycemic control for   <7.0% adults with diabetes     CBG: Recent  Labs  Lab 03/07/19 1644 03/07/19 2019 03/08/19 0722 03/08/19 1125 03/08/19 1642  GLUCAP 228* 153* 122* 228* 194*       Critical care time: 40 minutes    Roselie Awkward, MD [place] [facility] Pager: [phone] Cell: [phone] If no response, call [phone]

## 2019-03-09 ENCOUNTER — Inpatient Hospital Stay (HOSPITAL_COMMUNITY): Payer: Medicaid Other

## 2019-03-09 LAB — COMPREHENSIVE METABOLIC PANEL
ALT: 32 U/L (ref 0–44)
AST: 39 U/L (ref 15–41)
Albumin: 2.4 g/dL — ABNORMAL LOW (ref 3.5–5.0)
Alkaline Phosphatase: 120 U/L (ref 38–126)
Anion gap: 14 (ref 5–15)
BUN: 57 mg/dL — ABNORMAL HIGH (ref 8–23)
CO2: 24 mmol/L (ref 22–32)
Calcium: 8.1 mg/dL — ABNORMAL LOW (ref 8.9–10.3)
Chloride: 95 mmol/L — ABNORMAL LOW (ref 98–111)
Creatinine, Ser: 1.03 mg/dL (ref 0.61–1.24)
GFR calc Af Amer: >60 mL/min (ref >60)
GFR calc non Af Amer: >60 mL/min (ref >60)
Glucose, Bld: 172 mg/dL — ABNORMAL HIGH (ref 70–99)
Potassium: 3.9 mmol/L (ref 3.5–5.1)
Sodium: 133 mmol/L — ABNORMAL LOW (ref 135–145)
Total Bilirubin: 1.3 mg/dL — ABNORMAL HIGH (ref 0.3–1.2)
Total Protein: 6 g/dL — ABNORMAL LOW (ref 6.5–8.1)

## 2019-03-09 LAB — D-DIMER, QUANTITATIVE: D-Dimer, Quant: 1.41 ug/mL-FEU — ABNORMAL HIGH (ref 0.00–0.50)

## 2019-03-09 LAB — C-REACTIVE PROTEIN: CRP: 0.8 mg/dL (ref <1.0)

## 2019-03-09 LAB — POCT I-STAT 7, (LYTES, BLD GAS, ICA,H+H)
Acid-Base Excess: 6 mmol/L — ABNORMAL HIGH (ref 0.0–2.0)
Acid-Base Excess: 1 mmol/L (ref 0.0–2.0)
Bicarbonate: 29.3 mmol/L — ABNORMAL HIGH (ref 20.0–28.0)
Bicarbonate: 30.2 mmol/L — ABNORMAL HIGH (ref 20.0–28.0)
Bicarbonate: 31.4 mmol/L — ABNORMAL HIGH (ref 20.0–28.0)
Calcium, Ion: 1.11 mmol/L — ABNORMAL LOW (ref 1.15–1.40)
Calcium, Ion: 1.17 mmol/L (ref 1.15–1.40)
Calcium, Ion: 1.18 mmol/L (ref 1.15–1.40)
HCT: 46 % (ref 39.0–52.0)
HCT: 48 % (ref 39.0–52.0)
HCT: 50 % (ref 39.0–52.0)
Hemoglobin: 15.6 g/dL (ref 13.0–17.0)
Hemoglobin: 16.3 g/dL (ref 13.0–17.0)
Hemoglobin: 17 g/dL (ref 13.0–17.0)
O2 Saturation: 86 %
O2 Saturation: 94 %
O2 Saturation: 96 %
Patient temperature: 98.6
Patient temperature: 98.6
Patient temperature: 98.7
Potassium: 4 mmol/L (ref 3.5–5.1)
Potassium: 4.1 mmol/L (ref 3.5–5.1)
Potassium: 4.2 mmol/L (ref 3.5–5.1)
Sodium: 133 mmol/L — ABNORMAL LOW (ref 135–145)
Sodium: 135 mmol/L (ref 135–145)
Sodium: 135 mmol/L (ref 135–145)
TCO2: 30 mmol/L (ref 22–32)
TCO2: 32 mmol/L (ref 22–32)
TCO2: 34 mmol/L — ABNORMAL HIGH (ref 22–32)
pCO2 arterial: 38.6 mmHg (ref 32.0–48.0)
pCO2 arterial: 75.2 mmHg (ref 32.0–48.0)
pCO2 arterial: 77.8 mmHg (ref 32.0–48.0)
pH, Arterial: 7.212 — ABNORMAL LOW (ref 7.350–7.450)
pH, Arterial: 7.214 — ABNORMAL LOW (ref 7.350–7.450)
pH, Arterial: 7.489 — ABNORMAL HIGH (ref 7.350–7.450)
pO2, Arterial: 100 mmHg (ref 83.0–108.0)
pO2, Arterial: 47 mmHg — ABNORMAL LOW (ref 83.0–108.0)
pO2, Arterial: 89 mmHg (ref 83.0–108.0)

## 2019-03-09 LAB — CBC
HCT: 51.8 % (ref 39.0–52.0)
Hemoglobin: 17.2 g/dL — ABNORMAL HIGH (ref 13.0–17.0)
MCH: 29.4 pg (ref 26.0–34.0)
MCHC: 33.2 g/dL (ref 30.0–36.0)
MCV: 88.4 fL (ref 80.0–100.0)
Platelets: 118 10*3/uL — ABNORMAL LOW (ref 150–400)
RBC: 5.86 MIL/uL — ABNORMAL HIGH (ref 4.22–5.81)
RDW: 13.2 % (ref 11.5–15.5)
WBC: 21.1 10*3/uL — ABNORMAL HIGH (ref 4.0–10.5)
nRBC: 0 % (ref 0.0–0.2)

## 2019-03-09 LAB — GLUCOSE, CAPILLARY
Glucose-Capillary: 190 mg/dL — ABNORMAL HIGH (ref 70–99)
Glucose-Capillary: 146 mg/dL — ABNORMAL HIGH (ref 70–99)
Glucose-Capillary: 166 mg/dL — ABNORMAL HIGH (ref 70–99)
Glucose-Capillary: 183 mg/dL — ABNORMAL HIGH (ref 70–99)

## 2019-03-09 LAB — PHOSPHORUS
Phosphorus: 3 mg/dL (ref 2.5–4.6)
Phosphorus: 3.7 mg/dL (ref 2.5–4.6)

## 2019-03-09 LAB — MAGNESIUM
Magnesium: 2.3 mg/dL (ref 1.7–2.4)
Magnesium: 2.5 mg/dL — ABNORMAL HIGH (ref 1.7–2.4)

## 2019-03-09 LAB — TRIGLYCERIDES: Triglycerides: 190 mg/dL — ABNORMAL HIGH (ref <150)

## 2019-03-09 LAB — FERRITIN: Ferritin: 1288 ng/mL — ABNORMAL HIGH (ref 24–336)

## 2019-03-09 LAB — PROCALCITONIN: Procalcitonin: 0.12 ng/mL

## 2019-03-09 MED ORDER — DOXAZOSIN MESYLATE 2 MG PO TABS
2.0000 mg | ORAL_TABLET | Freq: Every day | ORAL | Status: DC
  Administered 2019-03-09 – 2019-03-16 (×7): 2 mg
  Filled 2019-03-09 (×8): qty 1

## 2019-03-09 MED ORDER — PROPOFOL 1000 MG/100ML IV EMUL
5.0000 ug/kg/min | INTRAVENOUS | Status: DC
  Administered 2019-03-09: 30 ug/kg/min via INTRAVENOUS
  Administered 2019-03-09: 5.468 ug/kg/min via INTRAVENOUS
  Administered 2019-03-10 (×4): 50 ug/kg/min via INTRAVENOUS
  Administered 2019-03-11 – 2019-03-12 (×6): 45 ug/kg/min via INTRAVENOUS
  Administered 2019-03-12: 45.057 ug/kg/min via INTRAVENOUS
  Administered 2019-03-12 – 2019-03-14 (×10): 45 ug/kg/min via INTRAVENOUS
  Filled 2019-03-09 (×27): qty 100

## 2019-03-09 MED ORDER — SODIUM CHLORIDE 0.9 % IV SOLN
1.0000 g | INTRAVENOUS | Status: DC
  Administered 2019-03-09 – 2019-03-13 (×5): 1 g via INTRAVENOUS
  Filled 2019-03-09 (×2): qty 10
  Filled 2019-03-09: qty 1
  Filled 2019-03-09 (×2): qty 10

## 2019-03-09 MED ORDER — VASOPRESSIN 20 UNIT/ML IV SOLN
0.0400 [IU]/min | INTRAVENOUS | Status: DC
  Administered 2019-03-09 – 2019-03-17 (×10): 0.04 [IU]/min via INTRAVENOUS
  Filled 2019-03-09 (×13): qty 2

## 2019-03-09 MED ORDER — LACTATED RINGERS IV BOLUS
500.0000 mL | Freq: Once | INTRAVENOUS | Status: AC
  Administered 2019-03-09: 500 mL via INTRAVENOUS

## 2019-03-09 MED ORDER — VECURONIUM BROMIDE 10 MG IV SOLR
0.1000 mg/kg | INTRAVENOUS | Status: DC | PRN
  Administered 2019-03-10 – 2019-03-13 (×11): 7.6 mg via INTRAVENOUS
  Filled 2019-03-09 (×11): qty 10

## 2019-03-09 MED ORDER — ROCURONIUM BROMIDE 10 MG/ML (PF) SYRINGE
70.0000 mg | PREFILLED_SYRINGE | Freq: Once | INTRAVENOUS | Status: AC
  Administered 2019-03-09: 70 mg via INTRAVENOUS
  Filled 2019-03-09: qty 10

## 2019-03-09 MED ORDER — ARTIFICIAL TEARS OPHTHALMIC OINT
1.0000 "application " | TOPICAL_OINTMENT | Freq: Three times a day (TID) | OPHTHALMIC | Status: DC
  Administered 2019-03-09 – 2019-03-14 (×15): 1 via OPHTHALMIC
  Filled 2019-03-09 (×3): qty 3.5

## 2019-03-09 MED ORDER — NOREPINEPHRINE 4 MG/250ML-% IV SOLN
0.0000 ug/min | INTRAVENOUS | Status: DC
  Administered 2019-03-09: 15 ug/min via INTRAVENOUS
  Administered 2019-03-09: 9.333 ug/min via INTRAVENOUS
  Administered 2019-03-10: 14 ug/min via INTRAVENOUS
  Administered 2019-03-10 (×2): 25 ug/min via INTRAVENOUS
  Administered 2019-03-10: 20 ug/min via INTRAVENOUS
  Administered 2019-03-10: 14 ug/min via INTRAVENOUS
  Administered 2019-03-10: 25 ug/min via INTRAVENOUS
  Filled 2019-03-09 (×8): qty 250

## 2019-03-09 NOTE — Procedures (Signed)
Chest Tube Insertion Procedure Note  Indications:  Clinically significant Pneumothorax  Pre-operative Diagnosis: Pneumothorax  Post-operative Diagnosis: Pneumothorax  Procedure Details  Informed consent was obtained for the procedure, including sedation.  Risks of lung perforation, hemorrhage, arrhythmia, and adverse drug reaction were discussed.   After sterile skin prep, using standard technique, a 14 French tube was placed in the right lateral 6th rib space.  Findings: None  Estimated Blood Loss:  Minimal         Specimens:  None              Complications:  None; patient tolerated the procedure well.         Disposition: ICU - intubated and critically ill.         Condition: stable  Attending Attestation: I was present and scrubbed for the entire procedure.

## 2019-03-09 NOTE — Progress Notes (Addendum)
Called and updated son.  Told him it was watch-and-wait now with supportive care on vent.  Erskine Emery MD PCCM

## 2019-03-09 NOTE — Progress Notes (Signed)
Alexander Erickson was having a difficult time this am with working over the ventilator. His sedation medicstion was changed to [place] and I worked most of the morning helping him to achieve a stable sedation however, Alexander Erickson was having difficulty with blood pressure. After 1330, the provider MD Tamala Julian made rounds and wanted to prone Alexander Erickson to help him with his airway, a bladder scan was performed as he was not putting out any urine. He had aprrox 724mls noted on the scanner. Dr. Tamala Julian instructed the nurse to place a indwelling foley to assist with monitoring his output and kidney function. The foley catheter was placed and the tube was secured to Alexander Erickson right leg. Dr. Tamala Julian informed the nurse he was going to place a CVC line to manage all the medications Alexander Erickson would need, he also placed a rhino rocket to Alexander Erickson right nare as he began to have a nose bleed. After Dr. Tamala Julian called Alexander. Erickson son Jeneen Rinks to obtain consent for placement of a CVC line, the line was placed under sterile fashion. A chest xray was performed to ensure placement . The line was cleared by Dr. Tamala Julian as good to use, however, Alexander Erickson was found to have a Right sided PE. Dr. Tamala Julian then placed a chest tube under sterile procedure to the Right side of Alexander Erickson chest. Alexander Erickson was sedated by Dr. Tamala Julian prior to the placement of the chest tube, and another chest xray was performed to confirm placement. This xray showed some concerning image of the Left lung as well and Dr. Tamala Julian felt a CT was needed. Respiratory therapy, 2 nurses, and the Doctor assisted Alexander Erickson to CT. While in CT, Alexander Erickson began to have another nose bleed. He was suctioned and returned to ICU. Dr. Tamala Julian placed a rhino rocket to his left nare. A second chest tube was placed to Alexander Erickson left side for a small left PE that was noted on the CT scan. An additional chest xray was performed to confirm placemen to the left sided chest tube. Alexander Erickson tolerated the procedures  as will be kept comfortable through sedation.

## 2019-03-09 NOTE — Procedures (Signed)
Central Venous Catheter Insertion Procedure Note Alexander Erickson [id number] 19-Jun-1955  Procedure: Insertion of Central Venous Catheter Indications: Assessment of intravascular volume and Drug and/or fluid administration  Procedure Details Consent: Risks of procedure as well as the alternatives and risks of each were explained to the (patient/caregiver).  Consent for procedure obtained. Time Out: Verified patient identification, verified procedure, site/side was marked, verified correct patient position, special equipment/implants available, medications/allergies/relevent history reviewed, required imaging and test results available.  Performed  Maximum sterile technique was used including antiseptics, cap, gloves, gown, hand hygiene, mask and sheet. Skin prep: Chlorhexidine; local anesthetic administered A antimicrobial bonded/coated triple lumen catheter was placed in the left internal jugular vein using the Seldinger technique.  Evaluation Blood flow good Complications: No apparent complications Patient did tolerate procedure well. Chest X-ray ordered to verify placement.  CXR: showed pneumothorax on opposite side? no CXR this AM, suspect slow growing due to high PEEP and dyssynchrony overnight.  Alexander Erickson 03/09/2019, 4:30 PM

## 2019-03-09 NOTE — Procedures (Signed)
Chest Tube Insertion Procedure Note  Indications:  Clinically significant Pneumothorax  Pre-operative Diagnosis: Pneumothorax  Post-operative Diagnosis: Pneumothorax  Procedure Details  Informed consent was obtained for the procedure, including sedation.  Risks of lung perforation, hemorrhage, arrhythmia, and adverse drug reaction were discussed.   After sterile skin prep, using standard technique, a 14 French tube was placed in the left lateral 6th rib space.  Findings: None  Estimated Blood Loss:  Minimal         Specimens:  None              Complications:  None; patient tolerated the procedure well.         Disposition: ICU - intubated and critically ill.         Condition: stable  Attending Attestation: I was present and scrubbed for the entire procedure.

## 2019-03-09 NOTE — Progress Notes (Signed)
NAME:  Alexander Erickson, MRN:  [id number], DOB:  08-Sep-1955, LOS: 15 ADMISSION DATE:  03/07/2019, CONSULTATION DATE:  12/18 REFERRING MD:  Jerral RalphGhimire, CHIEF COMPLAINT:  dyspnea   Brief History   63 y./o male admited with acute respiratory failure with hypoxemia due to COVID pneumonia, treated with heated high flow for days; developed hemoptysis and worsening respiratory failure requiring intubation on 12/18.  History of present illness   63 y/o male with acute respiratory failure with hypoxemia due to COVID pneumonia. Was admitted, treated with decadron, remdesivir, actemra.  There was concern for an IVC clot at one point.  This was ruled out with CT venogram.  He continued to be treated with heated high flow O2. Developed hemoptysis and worsening dyspnea/hypoxemia on 12/18 requiring intubation per his stated wishes.    Past Medical History  Hypertension Hyperlipidemia  Significant Hospital Events   11/30>> COVID test + 12/4>> [facility]>> transferred to ICU for heated high flow 12/11>> transferred back to PCU-heated high flow continued  12/17>> suspected to have superimposed bacterial pneumonia on top of COVID-19   Consults:  PCCM  Procedures:  12/18 ETT >   Significant Diagnostic Tests:  12/6>> Doppler positive for right lower extremity DVT 12/9>> echo with no RV strain, but possible IVC thrombus 12/9>> CT venogram chest/abdomen/pelvis-no IVC thrombus  Micro Data:  12/5 Resp culture>OPF  Antimicrobials:  COVID-19 medications: Decadron 12/4 >12/17 Remdesivir 12/4 > 12/13 (10-day course) Actemra 12/6 x 1  Antibiotics: Vancomycin 12/17>> Cefepime 12/17>> Rocephin: 12/4>>12/8   Interim history/subjective:  Intubated overnight.  Bloody secretions seem to have eased up a good deal.  Objective   Blood pressure 99/67, pulse 78, temperature 99.7 F (37.6 C), temperature source Oral, resp. rate 15, height 5' 5.5" (1.664 m), weight 76.2 kg, SpO2 97  %.    Vent Mode: PRVC FiO2 (%):  [80 %-100 %] 90 % Set Rate:  [20 bmp-28 bmp] 20 bmp Vt Set:  [420 mL] 420 mL PEEP:  [12 cmH20-14 cmH20] 14 cmH20 Plateau Pressure:  [24 cmH20-40 cmH20] 29 cmH20   Intake/Output Summary (Last 24 hours) at 03/09/2019 0745 Last data filed at 03/09/2019 0600 Gross per 24 hour  Intake 2577.35 ml  Output 1625 ml  Net 952.35 ml   Filed Weights   02/24/19 0532 03/03/19 0929 03/09/19 0405  Weight: 85 kg 77.5 kg 76.2 kg    Examination: GEN: elderly man on vent HEENT: ETT in place, no blood in secretions CV: RRR, ext warm PULM: Clear, no accessory muscle use GI: Soft, hypoactive BS EXT: No edema NEURO: agitated and moves all 4 ext with any stimulation PSYCH: either very agitated or obtunded SKIN: No rashes  BUN/Cr up Bili minimally elevated WBC up Plts down slightly  Resolved Hospital Problem list     Assessment & Plan:  COVID 19 pneumonia > severe acute worsening with hemoptysis - ARDSnet PEEP/FiO2, attempt to limit driving pressures as allowed by lung compliance - VAP prevention bundle -18/6h proning with A/a gradients < 150, adjust based on clinical response -Sedation to minimize shearing forces created by ventilator assynchrony - Keep even as able with balanced diuresis as tolerated by renal function  Hemoptysis> eased up, question if coming from nose/mouth - Rechallenge with AC  DVT: - Restart lovenox  Need for sedation for intubation- bad dyssynchrony today - PAD protocol - RASS goal -2 - Fetanyl, add and increase propofol as needed  HCAP- no convincing evidence of this other than white  count, stop abx and monitor fever curve, tracheal aspirate and secretions  Best practice:  Diet: tube feeding Pain/Anxiety/Delirium protocol (if indicated): yes as above VAP protocol (if indicated): yes DVT prophylaxis: lovenox GI prophylaxis: famotidine Glucose control: SSI Mobility: bed rest Code Status: full Family Communication: will  call today Disposition:  ICU  Labs   CBC: Recent Labs  Lab 03/05/19 0410 03/06/19 0110 03/06/19 2147 03/07/19 0240 03/08/19 0020 03/09/19 0357 03/09/19 0522  WBC 12.7* 13.7*  --  16.0* 15.3* 21.1*  --   HGB 18.1* 18.3* 19.0* 19.0* 18.4* 17.2* 17.0  HCT 54.4* 54.2* 56.0* 56.4* 54.1* 51.8 50.0  MCV 88.7 88.6  --  88.1 87.1 88.4  --   PLT 129* 133*  --  132* 124* 118*  --     Basic Metabolic Panel: Recent Labs  Lab 03/05/19 0410 03/06/19 0110 03/06/19 2147 03/07/19 0240 03/08/19 0020 03/08/19 1947 03/09/19 0357 03/09/19 0522  NA 135 136 130* 133* 133*  --  133* 133*  K 4.7 4.1 4.1 3.9 3.5  --  3.9 4.0  CL 89* 89*  --  87* 91*  --  95*  --   CO2 33* 30  --  30 28  --  24  --   GLUCOSE 129* 129*  --  103* 135*  --  172*  --   BUN 44* 44*  --  49* 46*  --  57*  --   CREATININE 0.86 0.97  --  0.87 0.76  --  1.03  --   CALCIUM 8.7* 8.5*  --  8.6* 8.4*  --  8.1*  --   MG  --  2.3  --   --   --  2.5* 2.3  --   PHOS  --   --   --   --   --  4.2 3.0  --    GFR: Estimated Creatinine Clearance: 69.8 mL/min (by C-G formula based on SCr of 1.03 mg/dL). Recent Labs  Lab 03/06/19 0110 03/07/19 0240 03/08/19 0020 03/09/19 0357  PROCALCITON 0.13  --   --   --   WBC 13.7* 16.0* 15.3* 21.1*    Liver Function Tests: Recent Labs  Lab 03/05/19 0410 03/06/19 0110 03/07/19 0240 03/08/19 0020 03/09/19 0357  AST 36 34 35 30 39  ALT 40 40 44 34 32  ALKPHOS 161* 153* 150* 132* 120  BILITOT 0.8 1.0 1.0 1.2 1.3*  PROT 6.8 6.9 7.0 6.7 6.0*  ALBUMIN 2.6* 2.7* 2.6* 2.6* 2.4*   No results for input(s): LIPASE, AMYLASE in the last 168 hours. No results for input(s): AMMONIA in the last 168 hours.  ABG    Component Value Date/Time   PHART 7.489 (H) 03/09/2019 0522   PCO2ART 38.6 03/09/2019 0522   PO2ART 47.0 (L) 03/09/2019 0522   HCO3 29.3 (H) 03/09/2019 0522   TCO2 30 03/09/2019 0522   O2SAT 86.0 03/09/2019 0522     Coagulation Profile: No results for input(s): INR,  PROTIME in the last 168 hours.  Cardiac Enzymes: No results for input(s): CKTOTAL, CKMB, CKMBINDEX, TROPONINI in the last 168 hours.  HbA1C: Hgb A1c MFr Bld  Date/Time Value Ref Range Status  02/27/2019 03:15 AM 6.8 (H) 4.8 - 5.6 % Final    Comment:    (NOTE) Pre diabetes:          5.7%-6.4% Diabetes:              >6.4% Glycemic control for   <7.0% adults  with diabetes     CBG: Recent Labs  Lab 03/08/19 1125 03/08/19 1642 03/08/19 2051 03/08/19 2324 03/09/19 0401  GLUCAP 228* 194* 158* 206* 190*        The patient is critically ill with multiple organ systems failure and requires high complexity decision making for assessment and support, frequent evaluation and titration of therapies, application of advanced monitoring technologies and extensive interpretation of multiple databases. Critical Care Time devoted to patient care services described in this note independent of APP/resident time (if applicable)  is 35 minutes.   Myrla Halsted MD [facility] 03/09/2019 7:50 AM Personal pager: [phone] If unanswered, please page CCM On-call: [phone]

## 2019-03-09 NOTE — Procedures (Signed)
R nares packed, blood oozing from mouth has eased up Keep in place at least 4-5 days Ceftriaxone for TSS prevention while in  Erskine Emery MD

## 2019-03-09 NOTE — Progress Notes (Signed)
Repeat CXR reviewed Despite air leak in atrium ongoing bilateral pneumothoraces. Looks to me like pneumomediastinum (silhouette around heart > pneumopericardium) of which could explain bilateral nature of pleural disease). Will paralyze to limit airway pressures and check chest CT.   Erskine Emery MD PCCM

## 2019-03-09 NOTE — Progress Notes (Addendum)
Called pt's son and spoke with him as well as an unidentified woman who was present. They explained that they had just spoken to the provider and were up to date on procedures (central line, chest tube, proning). They asked if we would call to update them after chest tube placement; I told them the update would not be immediate. I further explained the [facility] family update policy. They expressed understanding that there would be one phone call made per shift, and that it is atypical to hear from the healthcare team more than twice daily unless consent is needed or an emergency necessitates a phone call.  I expressed sympathy from the healthcare team as we recognize the difficulties families encounter who are unable to contact their loved ones as often as they regularly may. I explained the process of requesting and scheduling video chat with pt's family, which they also understood.  Family was grateful for the update.  ADDENDUM Pt's son, Alexander Erickson, called back about 30 minutes later with questions about sedation. I answered his questions in detail and ensured his understanding. Once he expressed understanding and denied having further questions, I asked him to write down future questions as they arise so that he can have them addressed during the night shift update.

## 2019-03-09 NOTE — Progress Notes (Signed)
Spoke with and provided updateds to patient's son, Jeneen Rinks at this time.

## 2019-03-09 NOTE — Progress Notes (Signed)
ANTICOAGULATION CONSULT NOTE - Follow Up Consult  Pharmacy Consult for Lovenox Indication: DVT  Allergies  Allergen Reactions  . Neomycin Other (See Comments) and Swelling    TOPICAL FORM ONLY.  . Other Other (See Comments)    Soy bean oil causes bronchial congestion, phlegm.  Animal dander.  Seasonal and environmental.  . Sulfur   . Thimerosal   . Apricot Flavor     dermatitis    Patient Measurements: Height: 5' 5.5" (166.4 cm) Weight: 167 lb 15.9 oz (76.2 kg) IBW/kg (Calculated) : 62.65  Vital Signs: Temp: 98.6 F (37 C) (12/19 0800) Temp Source: Axillary (12/19 0800) BP: 97/65 (12/19 0800) Pulse Rate: 75 (12/19 0800)  Labs: Recent Labs    03/07/19 0240 03/08/19 0020 03/09/19 0357 03/09/19 0522  HGB 19.0* 18.4* 17.2* 17.0  HCT 56.4* 54.1* 51.8 50.0  PLT 132* 124* 118*  --   CREATININE 0.87 0.76 1.03  --     Estimated Creatinine Clearance: 69.8 mL/min (by C-G formula based on SCr of 1.03 mg/dL).   Medications:  Scheduled:  . vitamin C  500 mg Per Tube Daily  . chlorhexidine gluconate (MEDLINE KIT)  15 mL Mouth Rinse BID  . Chlorhexidine Gluconate Cloth  6 each Topical Daily  . cholecalciferol  1,000 Units Per Tube Daily  . enoxaparin (LOVENOX) injection  80 mg Subcutaneous Q12H  . feeding supplement (PRO-STAT SUGAR FREE 64)  30 mL Per Tube BID  . feeding supplement (VITAL HIGH PROTEIN)  1,000 mL Per Tube Q24H  . furosemide  40 mg Intravenous Daily  . insulin aspart  0-9 Units Subcutaneous TID WC  . mouth rinse  15 mL Mouth Rinse BID  . mouth rinse  15 mL Mouth Rinse 10 times per day  . metoprolol tartrate  12.5 mg Oral BID  . pantoprazole  40 mg Oral Daily  . rosuvastatin  20 mg Per Tube q1800  . sodium chloride flush  3 mL Intravenous Q12H  . zinc sulfate  220 mg Per Tube Daily   Infusions:  . sodium chloride 10 mL/hr at 03/09/19 0800  . ceFEPime (MAXIPIME) IV Stopped (03/09/19 0723)  . dexmedetomidine (PRECEDEX) IV infusion 0.4 mcg/kg/hr (03/09/19  0800)  . famotidine (PEPCID) IV Stopped (03/08/19 2234)  . fentaNYL infusion INTRAVENOUS 200 mcg/hr (03/09/19 0926)  . norepinephrine (LEVOPHED) Adult infusion 10 mcg/min (03/09/19 0800)  . propofol (DIPRIVAN) infusion    . vancomycin 1,000 mg (03/08/19 2259)    Assessment: 63 yo M admitted with COVID, found to also have RLE DVT.  Pt continues on treatment dose Lovenox.   SCr ~1, stable CBC: Hgb remains elevated, Plt remains low/stable ~ 118k D-dimer decreased to 1.4  Bleeding:   12/18 Reported hemoptysis.  RN documents pt coughed up a bloody clot quarter size on 12/18 at 1800.  Per MD notes, Hold anticoagulation overnight. 12/19 No further bleeding or hemoptysis per RN/MD.  Dose already given on 12/19 at 0927.  Goal of Therapy:  Anti-Xa level 0.6-1 units/ml 4hrs after LMWH dose given Monitor platelets by anticoagulation protocol: Yes   Plan:  Continue Lovenox 57m/kg q12h as ordered. Follow-up renal function, CBC, montior for s/sx bleeding.   CGretta ArabPharmD, BCPS Clinical pharmacist phone 7am- 5pm: [phone]/19/2020 9:40 AM

## 2019-03-09 NOTE — Plan of Care (Signed)
I evaluated Mr. Deleonardis at bedside after bilateral chest tube placement with persistent but slightly improved pneumothoraces on chest x ray.  Both chest tubes were to suction, and tidaling, but no air leak.  I flushed both chest tubes with 32mL of saline each, and both flushed well.  I turned down the PEEP on the ventilator down from 14 to 10 then to 8, and also turned down his tidal volume from 433mL to 350mL (his 6cc/kg IBW).   Repeat chest xray showed again slightly improved pneumothoraces.     Then I placed both chest tubes to waterseal, which can also help with a patient on positive pressure.    With improvement in bilateral pneumothoraces with ventilator tweaks, and appropriate placement of chest tubes bilaterally on imaging, I do not think he needs repeat large bore chest tubes placed.  Will get repeat CXR in the AM.

## 2019-03-09 NOTE — Progress Notes (Signed)
[facility] Progress Note Patient Name: Ransome Helwig DOB: September 18, 1955 MRN: [id number]   Date of Service  03/09/2019  HPI/Events of Note  ABG on 80%/PRVC 28/TV 420/P 12 = 7.597/34.3/41.0/33.6. Sat = 96% by oximetry.   eICU Interventions  Will order: 1. Decrease PRVC rate to 20. 2. Repeat ABG at 5 AM.     Intervention Category Major Interventions: Acid-Base disturbance - evaluation and management;Respiratory failure - evaluation and management  Winferd Wease [place] 03/09/2019, 3:41 AM

## 2019-03-09 NOTE — Procedures (Signed)
L nares packed, blood oozing from mouth has eased up further Keep in place at least 4-5 days Ceftriaxone for TSS prevention while in  Erskine Emery MD

## 2019-03-09 NOTE — Progress Notes (Signed)
eLink Physician-Brief Progress Note Patient Name: Alexander Erickson DOB: 06-Apr-1955 MRN: [id number]   Date of Service  03/09/2019  HPI/Events of Note  Review of portable CXR - Residual R pneumothorax s/p R pigtail placement. BP = 130/65 with HR = 74. PEEP = 14.  eICU Interventions  Plan: 1. Notified ground team of need for larger chest tube.      Intervention Category Major Interventions: Hypoxemia - evaluation and management  Jewel Mcafee Eugene 03/09/2019, 8:17 PM

## 2019-03-10 ENCOUNTER — Inpatient Hospital Stay (HOSPITAL_COMMUNITY): Payer: Medicaid Other

## 2019-03-10 LAB — POCT I-STAT 7, (LYTES, BLD GAS, ICA,H+H)
Acid-Base Excess: 1 mmol/L (ref 0.0–2.0)
Acid-Base Excess: 3 mmol/L — ABNORMAL HIGH (ref 0.0–2.0)
Bicarbonate: 31.6 mmol/L — ABNORMAL HIGH (ref 20.0–28.0)
Bicarbonate: 31.5 mmol/L — ABNORMAL HIGH (ref 20.0–28.0)
Calcium, Ion: 1.18 mmol/L (ref 1.15–1.40)
Calcium, Ion: 1.19 mmol/L (ref 1.15–1.40)
HCT: 44 % (ref 39.0–52.0)
HCT: 37 % — ABNORMAL LOW (ref 39.0–52.0)
Hemoglobin: 15 g/dL (ref 13.0–17.0)
Hemoglobin: 12.6 g/dL — ABNORMAL LOW (ref 13.0–17.0)
O2 Saturation: 96 %
O2 Saturation: 69 %
Patient temperature: 98.09
Potassium: 4.5 mmol/L (ref 3.5–5.1)
Potassium: 4.3 mmol/L (ref 3.5–5.1)
Sodium: 137 mmol/L (ref 135–145)
Sodium: 137 mmol/L (ref 135–145)
TCO2: 34 mmol/L — ABNORMAL HIGH (ref 22–32)
TCO2: 34 mmol/L — ABNORMAL HIGH (ref 22–32)
pCO2 arterial: 81.9 mmHg (ref 32.0–48.0)
pCO2 arterial: 67.1 mmHg (ref 32.0–48.0)
pH, Arterial: 7.194 — CL (ref 7.350–7.450)
pH, Arterial: 7.278 — ABNORMAL LOW (ref 7.350–7.450)
pO2, Arterial: 108 mmHg (ref 83.0–108.0)
pO2, Arterial: 41 mmHg — ABNORMAL LOW (ref 83.0–108.0)

## 2019-03-10 LAB — COMPREHENSIVE METABOLIC PANEL
ALT: 21 U/L (ref 0–44)
AST: 27 U/L (ref 15–41)
Albumin: 1.7 g/dL — ABNORMAL LOW (ref 3.5–5.0)
Alkaline Phosphatase: 99 U/L (ref 38–126)
Anion gap: 8 (ref 5–15)
BUN: 59 mg/dL — ABNORMAL HIGH (ref 8–23)
CO2: 30 mmol/L (ref 22–32)
Calcium: 7.8 mg/dL — ABNORMAL LOW (ref 8.9–10.3)
Chloride: 98 mmol/L (ref 98–111)
Creatinine, Ser: 1.14 mg/dL (ref 0.61–1.24)
GFR calc Af Amer: >60 mL/min (ref >60)
GFR calc non Af Amer: >60 mL/min (ref >60)
Glucose, Bld: 193 mg/dL — ABNORMAL HIGH (ref 70–99)
Potassium: 4.5 mmol/L (ref 3.5–5.1)
Sodium: 136 mmol/L (ref 135–145)
Total Bilirubin: 0.4 mg/dL (ref 0.3–1.2)
Total Protein: 5.5 g/dL — ABNORMAL LOW (ref 6.5–8.1)

## 2019-03-10 LAB — CBC
HCT: 45 % (ref 39.0–52.0)
Hemoglobin: 14.3 g/dL (ref 13.0–17.0)
MCH: 29.7 pg (ref 26.0–34.0)
MCHC: 31.8 g/dL (ref 30.0–36.0)
MCV: 93.6 fL (ref 80.0–100.0)
Platelets: 87 10*3/uL — ABNORMAL LOW (ref 150–400)
RBC: 4.81 MIL/uL (ref 4.22–5.81)
RDW: 13.2 % (ref 11.5–15.5)
WBC: 17.7 10*3/uL — ABNORMAL HIGH (ref 4.0–10.5)
nRBC: 0 % (ref 0.0–0.2)

## 2019-03-10 LAB — GLUCOSE, CAPILLARY
Glucose-Capillary: 153 mg/dL — ABNORMAL HIGH (ref 70–99)
Glucose-Capillary: 166 mg/dL — ABNORMAL HIGH (ref 70–99)
Glucose-Capillary: 171 mg/dL — ABNORMAL HIGH (ref 70–99)
Glucose-Capillary: 173 mg/dL — ABNORMAL HIGH (ref 70–99)
Glucose-Capillary: 183 mg/dL — ABNORMAL HIGH (ref 70–99)
Glucose-Capillary: 195 mg/dL — ABNORMAL HIGH (ref 70–99)

## 2019-03-10 LAB — MAGNESIUM: Magnesium: 2.5 mg/dL — ABNORMAL HIGH (ref 1.7–2.4)

## 2019-03-10 LAB — D-DIMER, QUANTITATIVE: D-Dimer, Quant: 1.16 ug/mL-FEU — ABNORMAL HIGH (ref 0.00–0.50)

## 2019-03-10 LAB — TRIGLYCERIDES: Triglycerides: 185 mg/dL — ABNORMAL HIGH (ref <150)

## 2019-03-10 LAB — PHOSPHORUS: Phosphorus: 4.2 mg/dL (ref 2.5–4.6)

## 2019-03-10 MED ORDER — ALBUMIN HUMAN 25 % IV SOLN
25.0000 g | Freq: Four times a day (QID) | INTRAVENOUS | Status: AC
  Administered 2019-03-10 – 2019-03-11 (×3): 25 g via INTRAVENOUS
  Filled 2019-03-10 (×3): qty 50

## 2019-03-10 MED ORDER — PRO-STAT SUGAR FREE PO LIQD
30.0000 mL | Freq: Every day | ORAL | Status: DC
  Administered 2019-03-10 – 2019-03-13 (×4): 30 mL
  Filled 2019-03-10 (×4): qty 30

## 2019-03-10 MED ORDER — ALBUMIN HUMAN 25 % IV SOLN
25.0000 g | Freq: Four times a day (QID) | INTRAVENOUS | Status: DC
  Administered 2019-03-10: 25 g via INTRAVENOUS
  Filled 2019-03-10: qty 100
  Filled 2019-03-10: qty 50

## 2019-03-10 MED ORDER — VITAL 1.5 CAL PO LIQD
1000.0000 mL | ORAL | Status: DC
  Administered 2019-03-10 – 2019-03-16 (×10): 1000 mL

## 2019-03-10 MED ORDER — NOREPINEPHRINE 16 MG/250ML-% IV SOLN
0.0000 ug/min | INTRAVENOUS | Status: DC
  Administered 2019-03-10: 30 ug/min via INTRAVENOUS
  Administered 2019-03-11: 12 ug/min via INTRAVENOUS
  Administered 2019-03-15 – 2019-03-16 (×3): 34 ug/min via INTRAVENOUS
  Administered 2019-03-17: 10 ug/min via INTRAVENOUS
  Filled 2019-03-10 (×10): qty 250

## 2019-03-10 MED ORDER — PRO-STAT SUGAR FREE PO LIQD
60.0000 mL | Freq: Two times a day (BID) | ORAL | Status: DC
  Administered 2019-03-10 – 2019-03-13 (×6): 60 mL
  Filled 2019-03-10 (×6): qty 60

## 2019-03-10 MED ORDER — INSULIN ASPART 100 UNIT/ML ~~LOC~~ SOLN
0.0000 [IU] | SUBCUTANEOUS | Status: DC
  Administered 2019-03-10: 1 [IU] via SUBCUTANEOUS
  Administered 2019-03-10 (×2): 2 [IU] via SUBCUTANEOUS
  Administered 2019-03-11: 3 [IU] via SUBCUTANEOUS
  Administered 2019-03-11: 7 [IU] via SUBCUTANEOUS
  Administered 2019-03-11: 3 [IU] via SUBCUTANEOUS
  Administered 2019-03-11: 1 [IU] via SUBCUTANEOUS
  Administered 2019-03-11: 2 [IU] via SUBCUTANEOUS
  Administered 2019-03-11: 3 [IU] via SUBCUTANEOUS
  Administered 2019-03-12 (×2): 2 [IU] via SUBCUTANEOUS
  Administered 2019-03-12: 3 [IU] via SUBCUTANEOUS
  Administered 2019-03-12: 2 [IU] via SUBCUTANEOUS
  Administered 2019-03-12: 3 [IU] via SUBCUTANEOUS
  Administered 2019-03-12 – 2019-03-13 (×5): 2 [IU] via SUBCUTANEOUS
  Administered 2019-03-13: 1 [IU] via SUBCUTANEOUS
  Administered 2019-03-14: 2 [IU] via SUBCUTANEOUS
  Administered 2019-03-14: 3 [IU] via SUBCUTANEOUS
  Administered 2019-03-14 (×3): 2 [IU] via SUBCUTANEOUS
  Administered 2019-03-14: 1 [IU] via SUBCUTANEOUS
  Administered 2019-03-15: 3 [IU] via SUBCUTANEOUS
  Administered 2019-03-15: 1 [IU] via SUBCUTANEOUS
  Administered 2019-03-15: 2 [IU] via SUBCUTANEOUS
  Administered 2019-03-15: 1 [IU] via SUBCUTANEOUS
  Administered 2019-03-16 (×4): 3 [IU] via SUBCUTANEOUS
  Administered 2019-03-16: 2 [IU] via SUBCUTANEOUS
  Administered 2019-03-17 (×3): 5 [IU] via SUBCUTANEOUS

## 2019-03-10 NOTE — Progress Notes (Signed)
Initial Nutrition Assessment  INTERVENTION:   -Vital 1.5 @ 40 ml/hr via OGT -60 ml Prostat BID, 30 ml daily (5 total) -Provides 1940 kcals,  139g protein and 733 ml H2O  NUTRITION DIAGNOSIS:   Increased nutrient needs related to acute illness(COVID-19 infection) as evidenced by estimated needs.  GOAL:   Patient will meet greater than or equal to 90% of their needs  MONITOR:   Vent status, Labs, Weight trends, TF tolerance, I & O's  REASON FOR ASSESSMENT:   Consult, Ventilator Enteral/tube feeding initiation and management  ASSESSMENT:   63 y./o male admited with acute respiratory failure with hypoxemia due to COVID pneumonia, treated with heated high flow for days; developed hemoptysis and worsening respiratory failure requiring intubation on 12/18.  12/3: COVID+ 12/4: admitted 12/18: intubated following worsened dyspnea and hypoxemia  **RD working remotely**  Prior to intubation, pt was eating well, 50-100% of meals, intakes began to decline 0-20% on 12/17.   Patient is currently intubated on ventilator support MV: 13.5 L/min Temp (24hrs), Avg:98.4 F (36.9 C), Min:97.8 F (36.6 C), Max:98.7 F (37.1 C)  Propofol: 22.9 ml/hr -provides ~604 fat kcals  Medications: Vitamin C tablet, Vitamin D tablet, Zinc sulfate capsule, Levophed infusion, Vasopressin infusion  Labs reviewed: CBGs: 171 Phos WNL Low Mg TG: 185  NUTRITION - FOCUSED PHYSICAL EXAM:  Working remotely.  Diet Order:   Diet Order    None      EDUCATION NEEDS:   No education needs have been identified at this time  Skin:  Skin Assessment: Reviewed RN Assessment  Last BM:  12/19  Height:   Ht Readings from Last 1 Encounters:  02/26/2019 5' 5.5" (1.664 m)    Weight:   Wt Readings from Last 1 Encounters:  03/10/19 78.8 kg    Ideal Body Weight:  61.8 kg  BMI:  Body mass index is 28.47 kg/m.  Estimated Nutritional Needs:   Kcal:  2200-2400  Protein:  115-130g  Fluid:   2L/day  Clayton Bibles, MS, RD, LDN Inpatient Clinical Dietitian Pager: [phone] After Hours Pager: [phone]

## 2019-03-10 NOTE — Progress Notes (Signed)
NAME:  Alexander Erickson, MRN:  [id number], DOB:  [birth year], LOS: 74 ADMISSION DATE:  02/20/2019, CONSULTATION DATE:  12/18 REFERRING MD:  Sloan Leiter, CHIEF COMPLAINT:  dyspnea   Brief History   63 y./o male admited with acute respiratory failure with hypoxemia due to COVID pneumonia, treated with heated high flow for days; developed hemoptysis and worsening respiratory failure requiring intubation on 12/18.  History of present illness   63 y/o male with acute respiratory failure with hypoxemia due to COVID pneumonia. Was admitted, treated with decadron, remdesivir, actemra.  There was concern for an IVC clot at one point.  This was ruled out with CT venogram.  He continued to be treated with heated high flow O2. Developed hemoptysis and worsening dyspnea/hypoxemia on 12/18 requiring intubation per his stated wishes.    Past Medical History  Hypertension Hyperlipidemia  Significant Hospital Events   11/30>> COVID test + 12/4>> admit to [facility] via [facility] ED 12/9>> transferred to ICU for heated high flow 12/11>> transferred back to PCU-heated high flow continued  12/17>> suspected to have superimposed bacterial pneumonia on top of COVID-19  12/19>> nasal packing for ongoing nose bleed, bilateral chest tubes placed for pneumothoraces and pneumomediastinum  Consults:  PCCM  Procedures:  12/18 ETT >  12/19 R pigtail > 12/19 L pigtail > 12/19 [facility] MML > 12/19 bilateral nares packing  Significant Diagnostic Tests:  12/6>> Doppler positive for right lower extremity DVT 12/9>> echo with no RV strain, but possible IVC thrombus 12/9>> CT venogram chest/abdomen/pelvis-no IVC thrombus  Micro Data:  12/5 Resp culture>OPF  Antimicrobials:  COVID-19 medications: Decadron 12/4 >12/17 Remdesivir 12/4 > 12/13 (10-day course) Actemra 12/6 x 1  Antibiotics: Vancomycin 12/17>> Cefepime 12/17>> Rocephin: 12/4>>12/8   Interim history/subjective:  Seems to have settled out.  No further  oral bleeding issues. Overnight MD adjusted vent and placed CTs on waterseal to reduce transpulmonary gradients which appears to have helped. Remains sedated and paralyzed.  Objective   Blood pressure (!) 106/49, pulse 71, temperature 98.3 F (36.8 C), temperature source Axillary, resp. rate (!) 30, height 5' 5.5" (1.664 m), weight 78.8 kg, SpO2 100 %.    Vent Mode: PRVC FiO2 (%):  [90 %-100 %] 100 % Set Rate:  [20 bmp-34 bmp] 34 bmp Vt Set:  [370 mL-420 mL] 370 mL PEEP:  [8 cmH20-14 cmH20] 8 cmH20 Plateau Pressure:  [17 cmH20-28 cmH20] 23 cmH20   Intake/Output Summary (Last 24 hours) at 03/10/2019 7322 Last data filed at 03/10/2019 0600 Gross per 24 hour  Intake 2693.91 ml  Output 3175 ml  Net -481.09 ml   Filed Weights   03/03/19 0929 03/09/19 0405 03/10/19 0500  Weight: 77.5 kg 76.2 kg 78.8 kg    Examination: GEN: elderly man on vent HEENT: ETT in place, no blood in secretions CV: RRR, ext warm PULM: Clear, no accessory muscle use, some subcutaneous emphysema over bilateral upper chest GI: Soft, hypoactive BS EXT: No edema NEURO: paralyzed PSYCH: cannot assess SKIN: No rashes, bilateral pigtails in place without air leaks  ABG acute CO2 retention, A-a gradient improved BUN/Cr slgihtly up LFTs okay with exception of very low albumin Plts slightly down again, H/H stable, WBC improved  Resolved Hospital Problem list     Assessment & Plan:  COVID 19 pneumonia > severe acute worsening with hemoptysis- likely from nose bleed.  Complicated by pneumomedistinum and bilateral pneumothoraces from vent desynchrony 12/18-12/19 - ARDSnet PEEP/FiO2, attempt to limit driving pressures as allowed by lung compliance - VAP  prevention bundle - Keep even as able with balanced diuresis as tolerated by renal function 12/20  - Continue paralysis, lower airway pressures and chest tubes to waterseal to limit transpulmonary and trans-mediastinal gradient - Sedation to target BIS <  50  Nose bleeds> eased up with packing - Rechallenge with AC today (held last night while nose bleeds were issue) - Ceftriaxone for ppx while packing in place  DVT: - Restart lovenox  Best practice:  Diet: tube feeding Pain/Anxiety/Delirium protocol (if indicated): yes as above VAP protocol (if indicated): yes DVT prophylaxis: lovenox GI prophylaxis: famotidine Glucose control: SSI Mobility: bed rest Code Status: full Family Communication: will call today Disposition:  ICU  Labs   CBC: Recent Labs  Lab 03/05/19 0410 03/06/19 0110 03/07/19 0240 03/08/19 0020 03/09/19 0357 03/09/19 0522 03/09/19 1955 03/09/19 2354 03/10/19 0508  WBC 12.7* 13.7* 16.0* 15.3* 21.1*  --   --   --   --   HGB 18.1* 18.3* 19.0* 18.4* 17.2* 17.0 16.3 15.6 15.0  HCT 54.4* 54.2* 56.4* 54.1* 51.8 50.0 48.0 46.0 44.0  MCV 88.7 88.6 88.1 87.1 88.4  --   --   --   --   PLT 129* 133* 132* 124* 118*  --   --   --   --     Basic Metabolic Panel: Recent Labs  Lab 03/06/19 0110 03/07/19 0240 03/08/19 0020 03/08/19 1947 03/09/19 0357 03/09/19 0522 03/09/19 1657 03/09/19 1955 03/09/19 2354 03/10/19 0508 03/10/19 0529  NA 136 133* 133*  --  133* 133*  --  135 135 137 136  K 4.1 3.9 3.5  --  3.9 4.0  --  4.2 4.1 4.5 4.5  CL 89* 87* 91*  --  95*  --   --   --   --   --  98  CO2 30 30 28   --  24  --   --   --   --   --  30  GLUCOSE 129* 103* 135*  --  172*  --   --   --   --   --  193*  BUN 44* 49* 46*  --  57*  --   --   --   --   --  59*  CREATININE 0.97 0.87 0.76  --  1.03  --   --   --   --   --  1.14  CALCIUM 8.5* 8.6* 8.4*  --  8.1*  --   --   --   --   --  7.8*  MG 2.3  --   --  2.5* 2.3  --  2.5*  --   --   --  2.5*  PHOS  --   --   --  4.2 3.0  --  3.7  --   --   --  4.2   GFR: Estimated Creatinine Clearance: 64 mL/min (by C-G formula based on SCr of 1.14 mg/dL). Recent Labs  Lab 03/06/19 0110 03/07/19 0240 03/08/19 0020 03/09/19 0357 03/09/19 0500  PROCALCITON 0.13  --   --    --  0.12  WBC 13.7* 16.0* 15.3* 21.1*  --     Liver Function Tests: Recent Labs  Lab 03/06/19 0110 03/07/19 0240 03/08/19 0020 03/09/19 0357 03/10/19 0529  AST 34 35 30 39 27  ALT 40 44 34 32 21  ALKPHOS 153* 150* 132* 120 99  BILITOT 1.0 1.0 1.2 1.3* 0.4  PROT 6.9 7.0 6.7 6.0* 5.5*  ALBUMIN 2.7* 2.6* 2.6* 2.4* 1.7*   No results for input(s): LIPASE, AMYLASE in the last 168 hours. No results for input(s): AMMONIA in the last 168 hours.  ABG    Component Value Date/Time   PHART 7.194 (LL) 03/10/2019 0508   PCO2ART 81.9 (HH) 03/10/2019 0508   PO2ART 108.0 03/10/2019 0508   HCO3 31.6 (H) 03/10/2019 0508   TCO2 34 (H) 03/10/2019 0508   O2SAT 96.0 03/10/2019 0508     Coagulation Profile: No results for input(s): INR, PROTIME in the last 168 hours.  Cardiac Enzymes: No results for input(s): CKTOTAL, CKMB, CKMBINDEX, TROPONINI in the last 168 hours.  HbA1C: Hgb A1c MFr Bld  Date/Time Value Ref Range Status  02/27/2019 03:15 AM 6.8 (H) 4.8 - 5.6 % Final    Comment:    (NOTE) Pre diabetes:          5.7%-6.4% Diabetes:              >6.4% Glycemic control for   <7.0% adults with diabetes     CBG: Recent Labs  Lab 03/09/19 0401 03/09/19 0744 03/09/19 1259 03/09/19 1659 03/09/19 2311  GLUCAP 190* 146* 166* 183* 183*        The patient is critically ill with multiple organ systems failure and requires high complexity decision making for assessment and support, frequent evaluation and titration of therapies, application of advanced monitoring technologies and extensive interpretation of multiple databases. Critical Care Time devoted to patient care services described in this note independent of APP/resident time (if applicable)  is 33 minutes.   Myrla Halsted MD [facility] 03/10/2019 7:09 AM Personal pager: [phone] If unanswered, please page CCM On-call: #[phone]

## 2019-03-10 NOTE — Progress Notes (Signed)
Called and updated son.  Dan Romelle Muldoon MD 

## 2019-03-11 ENCOUNTER — Inpatient Hospital Stay (HOSPITAL_COMMUNITY): Payer: Medicaid Other

## 2019-03-11 LAB — BASIC METABOLIC PANEL
Anion gap: 7 (ref 5–15)
BUN: 47 mg/dL — ABNORMAL HIGH (ref 8–23)
CO2: 29 mmol/L (ref 22–32)
Calcium: 7.9 mg/dL — ABNORMAL LOW (ref 8.9–10.3)
Chloride: 104 mmol/L (ref 98–111)
Creatinine, Ser: 0.94 mg/dL (ref 0.61–1.24)
GFR calc Af Amer: >60 mL/min (ref >60)
GFR calc non Af Amer: >60 mL/min (ref >60)
Glucose, Bld: 181 mg/dL — ABNORMAL HIGH (ref 70–99)
Potassium: 4.5 mmol/L (ref 3.5–5.1)
Sodium: 140 mmol/L (ref 135–145)

## 2019-03-11 LAB — CBC
HCT: 38.2 % — ABNORMAL LOW (ref 39.0–52.0)
Hemoglobin: 12.1 g/dL — ABNORMAL LOW (ref 13.0–17.0)
MCH: 30.3 pg (ref 26.0–34.0)
MCHC: 31.7 g/dL (ref 30.0–36.0)
MCV: 95.7 fL (ref 80.0–100.0)
Platelets: 59 10*3/uL — ABNORMAL LOW (ref 150–400)
RBC: 3.99 MIL/uL — ABNORMAL LOW (ref 4.22–5.81)
RDW: 13.4 % (ref 11.5–15.5)
WBC: 13.8 10*3/uL — ABNORMAL HIGH (ref 4.0–10.5)
nRBC: 0 % (ref 0.0–0.2)

## 2019-03-11 LAB — GLUCOSE, CAPILLARY
Glucose-Capillary: 201 mg/dL — ABNORMAL HIGH (ref 70–99)
Glucose-Capillary: 141 mg/dL — ABNORMAL HIGH (ref 70–99)
Glucose-Capillary: 177 mg/dL — ABNORMAL HIGH (ref 70–99)
Glucose-Capillary: 304 mg/dL — ABNORMAL HIGH (ref 70–99)
Glucose-Capillary: 271 mg/dL — ABNORMAL HIGH (ref 70–99)
Glucose-Capillary: 222 mg/dL — ABNORMAL HIGH (ref 70–99)

## 2019-03-11 LAB — TRIGLYCERIDES: Triglycerides: 130 mg/dL (ref <150)

## 2019-03-11 LAB — MAGNESIUM: Magnesium: 2.5 mg/dL — ABNORMAL HIGH (ref 1.7–2.4)

## 2019-03-11 LAB — DIC (DISSEMINATED INTRAVASCULAR COAGULATION)PANEL
D-Dimer, Quant: 1.75 ug/mL-FEU — ABNORMAL HIGH (ref 0.00–0.50)
Fibrinogen: 752 mg/dL — ABNORMAL HIGH (ref 210–475)
INR: 1.3 — ABNORMAL HIGH (ref 0.8–1.2)
Platelets: 60 10*3/uL — ABNORMAL LOW (ref 150–400)
Prothrombin Time: 16.3 seconds — ABNORMAL HIGH (ref 11.4–15.2)
Smear Review: NONE SEEN
aPTT: 41 seconds — ABNORMAL HIGH (ref 24–36)

## 2019-03-11 MED ORDER — STERILE WATER FOR INJECTION IJ SOLN
INTRAMUSCULAR | Status: AC
  Administered 2019-03-11: 09:00:00 5 mL
  Filled 2019-03-11: qty 10

## 2019-03-11 MED ORDER — ALBUMIN HUMAN 25 % IV SOLN
50.0000 g | Freq: Once | INTRAVENOUS | Status: AC
  Administered 2019-03-11: 50 g via INTRAVENOUS
  Filled 2019-03-11: qty 200

## 2019-03-11 MED ORDER — CHLORHEXIDINE GLUCONATE 0.12 % MT SOLN
15.0000 mL | Freq: Two times a day (BID) | OROMUCOSAL | Status: DC
  Administered 2019-03-11 – 2019-03-17 (×14): 15 mL via OROMUCOSAL
  Filled 2019-03-11 (×11): qty 15

## 2019-03-11 MED ORDER — ACETAZOLAMIDE SODIUM 500 MG IJ SOLR
500.0000 mg | Freq: Once | INTRAMUSCULAR | Status: AC
  Administered 2019-03-11: 500 mg via INTRAVENOUS
  Filled 2019-03-11: qty 500

## 2019-03-11 MED ORDER — FUROSEMIDE 10 MG/ML IJ SOLN
40.0000 mg | Freq: Once | INTRAMUSCULAR | Status: AC
  Administered 2019-03-11: 40 mg via INTRAVENOUS
  Filled 2019-03-11: qty 4

## 2019-03-11 MED ORDER — FAMOTIDINE 40 MG/5ML PO SUSR
20.0000 mg | Freq: Two times a day (BID) | ORAL | Status: DC
  Administered 2019-03-11 – 2019-03-16 (×11): 20 mg
  Filled 2019-03-11 (×11): qty 2.5

## 2019-03-11 NOTE — Progress Notes (Signed)
PT Cancellation Note  Patient Details Name: Alexander Erickson MRN: [id number] DOB: [birth year]   Cancelled Treatment:    Reason Eval/Treat Not Completed: Medical issues which prohibited therapy.  Pt intubated and paralyzed.  Significant medical decline since PT last saw him. PT to sign off.  Please re-order if/when pt stabilizes.   Thanks,  Verdene Lennert, PT, DPT  Acute Rehabilitation [phone] pager [phone] office  @ [facility]: [phone]     [name] 03/11/2019, 8:37 AM

## 2019-03-11 NOTE — Progress Notes (Signed)
NAME:  Alexander Erickson, MRN:  [id number], DOB:  [birth year], LOS: 22 ADMISSION DATE:  Feb 24, 2019, CONSULTATION DATE:  12/18 REFERRING MD:  Sloan Leiter, CHIEF COMPLAINT:  dyspnea   Brief History   63 y./o male admited with acute respiratory failure with hypoxemia due to COVID pneumonia, treated with heated high flow for days; developed hemoptysis and worsening respiratory failure requiring intubation on 12/18.  History of present illness   63 y/o male with acute respiratory failure with hypoxemia due to COVID pneumonia. Was admitted, treated with decadron, remdesivir, actemra.  There was concern for an IVC clot at one point.  This was ruled out with CT venogram.  He continued to be treated with heated high flow O2. Developed hemoptysis and worsening dyspnea/hypoxemia on 12/18 requiring intubation per his stated wishes.    Past Medical History  Hypertension Hyperlipidemia  Significant Hospital Events   11/30>> COVID test + 12/4>> admit to [facility] via [facility] ED 12/9>> transferred to ICU for heated high flow 12/11>> transferred back to PCU-heated high flow continued  12/17>> suspected to have superimposed bacterial pneumonia on top of COVID-19  12/19>> nasal packing for ongoing nose bleed, bilateral chest tubes placed for pneumothoraces and pneumomediastinum  Consults:  PCCM  Procedures:  12/18 ETT >  12/19 R pigtail > 12/19 L pigtail > 12/19 LIJ MML > 12/19 bilateral nares packing  Significant Diagnostic Tests:  12/6>> Doppler positive for right lower extremity DVT 12/9>> echo with no RV strain, but possible IVC thrombus 12/9>> CT venogram chest/abdomen/pelvis-no IVC thrombus  Micro Data:  12/5 Resp culture>OPF  Antimicrobials:  COVID-19 medications: Decadron 12/4 >12/17 Remdesivir 12/4 > 12/13 (10-day course) Actemra 12/6 x 1  Antibiotics: Vancomycin 12/17>> Cefepime 12/17>> Rocephin: 12/4>>12/8   Interim history/subjective:  No major events overnight.  Remains on  very high vent settings.  Objective   Blood pressure 131/64, pulse 70, temperature 98.5 F (36.9 C), temperature source Axillary, resp. rate (!) 26, height 5' 5.5" (1.664 m), weight 78.8 kg, SpO2 93 %.    Vent Mode: PRVC FiO2 (%):  [80 %-100 %] 90 % Set Rate:  [34 bmp] 34 bmp Vt Set:  [400 mL] 400 mL PEEP:  [5 cmH20-12 cmH20] 12 cmH20 Plateau Pressure:  [24 cmH20-33 cmH20] 30 cmH20   Intake/Output Summary (Last 24 hours) at 03/11/2019 0716 Last data filed at 03/11/2019 0700 Gross per 24 hour  Intake 4194.82 ml  Output 2455 ml  Net 1739.82 ml   Filed Weights   03/03/19 0929 03/09/19 0405 03/10/19 0500  Weight: 77.5 kg 76.2 kg 78.8 kg    Examination: GEN: elderly man on vent HEENT: ETT in place, no blood in secretions CV: RRR, ext warm PULM: Scattered rhonci, passive on vent GI: Soft, hypoactive BS EXT: No edema NEURO: paralyzed PSYCH: cannot assess SKIN: No rashes, bilateral pigtails in place without air leaks  BUN and creatinine improved, 2.1 L urine output Triglycerides are okay Blood sugars a little on the higher side but not alarming White count improved, hemoglobin stable, thrombocytopenia worse CXR 12/21 resolved PTX, continued bilateral airspace disease  Resolved Hospital Problem list     Assessment & Plan:  COVID 19 pneumonia > severe acute worsening with hemoptysis- likely from nose bleed.  Complicated by pneumomedistinum and bilateral pneumothoraces from vent desynchrony 12/18-12/19 - ARDSnet PEEP/FiO2, attempt to limit driving pressures as allowed by lung compliance - VAP prevention bundle - Keep even as able with balanced diuresis as tolerated by renal function  12/21 - Start proning protocol  16/8h - Continue chest tubes to water seal - Check ABG - Lasix/diamox x 1  Nose bleeds> eased up with packing - Continue packing - Ceftriaxone for ppx while packing in place - Tentative removal 12/24  DVT: - Continue lovenox for now  Thrombocytopenia-  reactive in setting of critical illness.  Low HIT probability score. - Check DIC panel, peripheral smear, HIT ab - Continue lovenox for now given low 4T score, will have to DC vs. transition if plts < 50k or HIT ab +  Best practice:  Diet: tube feeding Pain/Anxiety/Delirium protocol (if indicated): yes as above VAP protocol (if indicated): yes DVT prophylaxis: lovenox GI prophylaxis: famotidine Glucose control: SSI Mobility: bed rest Code Status: full Family Communication: will call today Disposition:  ICU  Labs   CBC: Recent Labs  Lab 03/07/19 0240 03/08/19 0020 03/09/19 0357 03/09/19 2354 03/10/19 0508 03/10/19 0529 03/10/19 1502 03/11/19 0500  WBC 16.0* 15.3* 21.1*  --   --  17.7*  --  13.8*  HGB 19.0* 18.4* 17.2* 15.6 15.0 14.3 12.6* 12.1*  HCT 56.4* 54.1* 51.8 46.0 44.0 45.0 37.0* 38.2*  MCV 88.1 87.1 88.4  --   --  93.6  --  95.7  PLT 132* 124* 118*  --   --  87*  --  59*    Basic Metabolic Panel: Recent Labs  Lab 03/06/19 0110 03/07/19 0240 03/08/19 0020 03/08/19 1947 03/09/19 0357 03/09/19 1657 03/09/19 2354 03/10/19 0508 03/10/19 0529 03/10/19 1502 03/11/19 0500  NA   < > 133* 133*  --  133*  --  135 137 136 137 140  K   < > 3.9 3.5  --  3.9  --  4.1 4.5 4.5 4.3 4.5  CL  --  87* 91*  --  95*  --   --   --  98  --  104  CO2  --  30 28  --  24  --   --   --  30  --  29  GLUCOSE  --  103* 135*  --  172*  --   --   --  193*  --  181*  BUN  --  49* 46*  --  57*  --   --   --  59*  --  47*  CREATININE  --  0.87 0.76  --  1.03  --   --   --  1.14  --  0.94  CALCIUM  --  8.6* 8.4*  --  8.1*  --   --   --  7.8*  --  7.9*  MG  --   --   --  2.5* 2.3 2.5*  --   --  2.5*  --  2.5*  PHOS  --   --   --  4.2 3.0 3.7  --   --  4.2  --   --    < > = values in this interval not displayed.   GFR: Estimated Creatinine Clearance: 77.6 mL/min (by C-G formula based on SCr of 0.94 mg/dL). Recent Labs  Lab 03/06/19 0110 03/08/19 0020 03/09/19 0357 03/09/19 0500  03/10/19 0529 03/11/19 0500  PROCALCITON 0.13  --   --  0.12  --   --   WBC 13.7* 15.3* 21.1*  --  17.7* 13.8*    Liver Function Tests: Recent Labs  Lab 03/06/19 0110 03/07/19 0240 03/08/19 0020 03/09/19 0357 03/10/19 0529  AST 34 35 30 39 27  ALT 40 44  34 32 21  ALKPHOS 153* 150* 132* 120 99  BILITOT 1.0 1.0 1.2 1.3* 0.4  PROT 6.9 7.0 6.7 6.0* 5.5*  ALBUMIN 2.7* 2.6* 2.6* 2.4* 1.7*   No results for input(s): LIPASE, AMYLASE in the last 168 hours. No results for input(s): AMMONIA in the last 168 hours.  ABG    Component Value Date/Time   PHART 7.278 (L) 03/10/2019 1502   PCO2ART 67.1 (HH) 03/10/2019 1502   PO2ART 41.0 (L) 03/10/2019 1502   HCO3 31.5 (H) 03/10/2019 1502   TCO2 34 (H) 03/10/2019 1502   O2SAT 69.0 03/10/2019 1502     Coagulation Profile: No results for input(s): INR, PROTIME in the last 168 hours.  Cardiac Enzymes: No results for input(s): CKTOTAL, CKMB, CKMBINDEX, TROPONINI in the last 168 hours.  HbA1C: Hgb A1c MFr Bld  Date/Time Value Ref Range Status  02/27/2019 03:15 AM 6.8 (H) 4.8 - 5.6 % Final    Comment:    (NOTE) Pre diabetes:          5.7%-6.4% Diabetes:              >6.4% Glycemic control for   <7.0% adults with diabetes     CBG: Recent Labs  Lab 03/10/19 1138 03/10/19 1651 03/10/19 1950 03/10/19 2334 03/11/19 0353  GLUCAP 195* 153* 173* 166* 201*        The patient is critically ill with multiple organ systems failure and requires high complexity decision making for assessment and support, frequent evaluation and titration of therapies, application of advanced monitoring technologies and extensive interpretation of multiple databases. Critical Care Time devoted to patient care services described in this note independent of APP/resident time (if applicable)  is 42 minutes.   Myrla Halstedan Rayann Jolley MD [facility] 03/11/2019 7:16 AM Personal pager: [phone] If unanswered, please page CCM On-call:  #[phone]

## 2019-03-11 NOTE — Plan of Care (Signed)
  Problem: Coping: Goal: Psychosocial and spiritual needs will be supported Outcome: Progressing   Problem: Respiratory: Goal: Will maintain a patent airway Outcome: Progressing   Problem: Clinical Measurements: Goal: Will remain free from infection Outcome: Progressing Goal: Diagnostic test results will improve Outcome: Progressing Goal: Cardiovascular complication will be avoided Outcome: Progressing   Problem: Activity: Goal: Risk for activity intolerance will decrease Outcome: Progressing   Problem: Nutrition: Goal: Adequate nutrition will be maintained Outcome: Progressing   Problem: Coping: Goal: Level of anxiety will decrease Outcome: Progressing   Problem: Elimination: Goal: Will not experience complications related to bowel motility Outcome: Progressing Goal: Will not experience complications related to urinary retention Outcome: Progressing   Problem: Pain Managment: Goal: General experience of comfort will improve Outcome: Progressing   Problem: Safety: Goal: Ability to remain free from injury will improve Outcome: Progressing   Problem: Skin Integrity: Goal: Risk for impaired skin integrity will decrease Outcome: Progressing

## 2019-03-11 NOTE — Progress Notes (Signed)
Spoke with pts son Jeneen Rinks.  Updated on pt status.  Currently resting comfortably on ventilator.  Able to wean vasopressors and oxygen requirements today.  Night shift RN to coordinate facetime this evening.  All questions answered.

## 2019-03-11 NOTE — Progress Notes (Signed)
Spoke with pts son Jeneen Rinks.  Updated on pt status.  Currently sedated and resting comfortably on ventilator.  On medication to keep blood pressure at safe level.  Plan is to turn on stomach today to allow for better oxygenation.  All questions answered.

## 2019-03-11 NOTE — Progress Notes (Signed)
Notified Dr Tamala Julian that pts HR had steadily increased over past 2 hours from 90s to 120s.  Pt had approx 2L from diruesis.  MD ordered 50g of 25% albumin.

## 2019-03-12 ENCOUNTER — Inpatient Hospital Stay (HOSPITAL_COMMUNITY): Payer: Medicaid Other

## 2019-03-12 LAB — GLUCOSE, CAPILLARY
Glucose-Capillary: 181 mg/dL — ABNORMAL HIGH (ref 70–99)
Glucose-Capillary: 171 mg/dL — ABNORMAL HIGH (ref 70–99)
Glucose-Capillary: 211 mg/dL — ABNORMAL HIGH (ref 70–99)
Glucose-Capillary: 201 mg/dL — ABNORMAL HIGH (ref 70–99)
Glucose-Capillary: 167 mg/dL — ABNORMAL HIGH (ref 70–99)
Glucose-Capillary: 153 mg/dL — ABNORMAL HIGH (ref 70–99)
Glucose-Capillary: 147 mg/dL — ABNORMAL HIGH (ref 70–99)

## 2019-03-12 LAB — BASIC METABOLIC PANEL
Anion gap: 7 (ref 5–15)
BUN: 60 mg/dL — ABNORMAL HIGH (ref 8–23)
CO2: 32 mmol/L (ref 22–32)
Calcium: 8.1 mg/dL — ABNORMAL LOW (ref 8.9–10.3)
Chloride: 102 mmol/L (ref 98–111)
Creatinine, Ser: 1.32 mg/dL — ABNORMAL HIGH (ref 0.61–1.24)
GFR calc Af Amer: >60 mL/min (ref >60)
GFR calc non Af Amer: 57 mL/min — ABNORMAL LOW (ref >60)
Glucose, Bld: 216 mg/dL — ABNORMAL HIGH (ref 70–99)
Potassium: 4.2 mmol/L (ref 3.5–5.1)
Sodium: 141 mmol/L (ref 135–145)

## 2019-03-12 LAB — POCT I-STAT 7, (LYTES, BLD GAS, ICA,H+H)
Acid-Base Excess: 3 mmol/L — ABNORMAL HIGH (ref 0.0–2.0)
Bicarbonate: 33.8 mmol/L — ABNORMAL HIGH (ref 20.0–28.0)
Calcium, Ion: 1.23 mmol/L (ref 1.15–1.40)
HCT: 32 % — ABNORMAL LOW (ref 39.0–52.0)
Hemoglobin: 10.9 g/dL — ABNORMAL LOW (ref 13.0–17.0)
O2 Saturation: 79 %
Patient temperature: 97.8
Potassium: 4.4 mmol/L (ref 3.5–5.1)
Sodium: 142 mmol/L (ref 135–145)
TCO2: 37 mmol/L — ABNORMAL HIGH (ref 22–32)
pCO2 arterial: 92.9 mmHg (ref 32.0–48.0)
pH, Arterial: 7.166 — CL (ref 7.350–7.450)
pO2, Arterial: 56 mmHg — ABNORMAL LOW (ref 83.0–108.0)

## 2019-03-12 LAB — HEPARIN INDUCED PLATELET AB (HIT ANTIBODY): Heparin Induced Plt Ab: 0.08 OD (ref 0.000–0.400)

## 2019-03-12 LAB — CBC
HCT: 35.1 % — ABNORMAL LOW (ref 39.0–52.0)
Hemoglobin: 10.4 g/dL — ABNORMAL LOW (ref 13.0–17.0)
MCH: 29.6 pg (ref 26.0–34.0)
MCHC: 29.6 g/dL — ABNORMAL LOW (ref 30.0–36.0)
MCV: 100 fL (ref 80.0–100.0)
Platelets: 56 10*3/uL — ABNORMAL LOW (ref 150–400)
RBC: 3.51 MIL/uL — ABNORMAL LOW (ref 4.22–5.81)
RDW: 14.2 % (ref 11.5–15.5)
WBC: 10.7 10*3/uL — ABNORMAL HIGH (ref 4.0–10.5)
nRBC: 0 % (ref 0.0–0.2)

## 2019-03-12 LAB — TRIGLYCERIDES: Triglycerides: 146 mg/dL (ref <150)

## 2019-03-12 LAB — MAGNESIUM: Magnesium: 2.7 mg/dL — ABNORMAL HIGH (ref 1.7–2.4)

## 2019-03-12 MED ORDER — INSULIN DETEMIR 100 UNIT/ML ~~LOC~~ SOLN
8.0000 [IU] | Freq: Two times a day (BID) | SUBCUTANEOUS | Status: DC
  Administered 2019-03-12 – 2019-03-16 (×10): 8 [IU] via SUBCUTANEOUS
  Filled 2019-03-12 (×13): qty 0.08

## 2019-03-12 NOTE — Progress Notes (Signed)
lavaegd pt with 10 ml saline flush per MD order. Small amount of thick thick yellow secretions removed. Pt stable throughout with no complications

## 2019-03-12 NOTE — Progress Notes (Signed)
Assisted tele visit to patient with son.  Thomas, Keltie Labell Renee, RN   

## 2019-03-12 NOTE — Progress Notes (Signed)
NAME:  Bader Stubblefield, MRN:  [id number], DOB:  1956-01-05, LOS: 18 ADMISSION DATE:  03/14/2019, CONSULTATION DATE:  12/18 REFERRING MD:  Jerral Ralph, CHIEF COMPLAINT:  dyspnea   Brief History   63 y./o male admited with acute respiratory failure with hypoxemia due to COVID pneumonia, treated with heated high flow for days; developed hemoptysis and worsening respiratory failure requiring intubation on 12/18.  History of present illness   63 y/o male with acute respiratory failure with hypoxemia due to COVID pneumonia. Was admitted, treated with decadron, remdesivir, actemra.  There was concern for an IVC clot at one point.  This was ruled out with CT venogram.  He continued to be treated with heated high flow O2. Developed hemoptysis and worsening dyspnea/hypoxemia on 12/18 requiring intubation per his stated wishes.    Past Medical History  Hypertension Hyperlipidemia  Significant Hospital Events   11/30>> COVID test + 12/4>> admit to [facility] via [facility] ED 12/9>> transferred to ICU for heated high flow 12/11>> transferred back to PCU-heated high flow continued  12/17>> suspected to have superimposed bacterial pneumonia on top of COVID-19  12/19>> nasal packing for ongoing nose bleed, bilateral chest tubes placed for pneumothoraces and pneumomediastinum  Consults:  PCCM  Procedures:  12/18 ETT >  12/19 R pigtail > 12/19 L pigtail > 12/19 LIJ MML > 12/19 bilateral nares packing  Significant Diagnostic Tests:  12/6>> Doppler positive for right lower extremity DVT 12/9>> echo with no RV strain, but possible IVC thrombus 12/9>> CT venogram chest/abdomen/pelvis-no IVC thrombus  Micro Data:  12/5 Resp culture>OPF  Antimicrobials:  COVID-19 medications: Decadron 12/4 >12/17 Remdesivir 12/4 > 12/13 (10-day course) Actemra 12/6 x 1  Antibiotics: Vancomycin 12/17>> Cefepime 12/17>> Rocephin: 12/4>>12/8   Interim history/subjective:  No major events overnight.  Remains on  high amounts of PEEP and oxygen.  Objective   Blood pressure 126/61, pulse 85, temperature 97.7 F (36.5 C), temperature source Oral, resp. rate (!) 34, height 5' 5.5" (1.664 m), weight 79.1 kg, SpO2 95 %.    Vent Mode: PRVC FiO2 (%):  [85 %-100 %] 85 % Set Rate:  [34 bmp] 34 bmp Vt Set:  [400 mL] 400 mL PEEP:  [12 cmH20-16 cmH20] 12 cmH20 Plateau Pressure:  [31 cmH20-44 cmH20] 37 cmH20   Intake/Output Summary (Last 24 hours) at 03/12/2019 0753 Last data filed at 03/12/2019 0700 Gross per 24 hour  Intake 3368.79 ml  Output 3165 ml  Net 203.79 ml   Filed Weights   03/09/19 0405 03/10/19 0500 03/12/19 0500  Weight: 76.2 kg 78.8 kg 79.1 kg    Examination: GEN: elderly man on vent HEENT: ETT in place, no blood in secretions CV: RRR, ext warm PULM: Scattered rhonci, passive on vent GI: Soft, hypoactive BS EXT: No edema NEURO: paralyzed PSYCH: cannot assess SKIN: No rashes, bilateral pigtails in place without air leaks  Chest x-ray reviewed, bilateral chest tubes in place, no evident pneumothorax, bilateral airspace disease BUN/creatinine slightly up Sugars are high He was even yesterday, 2.6 L urine output  Resolved Hospital Problem list     Assessment & Plan:  COVID 19 pneumonia > severe acute worsening with hemoptysis- likely from nose bleed.  Complicated by pneumomedistinum and bilateral pneumothoraces from vent desynchrony 12/18-12/19 - ARDSnet PEEP/FiO2, attempt to limit driving pressures as allowed by lung compliance - VAP prevention bundle - Keep even as able with balanced diuresis as tolerated by renal function  12/22 - Start proning protocol 16/8h (held yesterday for some issues with  ventilation which have been remedied with switch to [facility] and lower PEEP) - Continue chest tubes to water seal, right fell out a bit, resecured, still having output - Check ABG - hold on diuresis today with rise in BUN/Cr  Nose bleeds> eased up with packing - Continue packing -  Ceftriaxone for ppx while packing in place - Tentative removal 12/24 - Check AM CBC  DVT: - Continue lovenox for now  DM- A1c 6.8% on admission - Start levemir, continue SSI  Thrombocytopenia- reactive in setting of critical illness.  Low HIT probability score. - No schistocytes on smear, DIC panel relatively benign - Stable plts today - Continue lovenox for now given low 4T score, will have to DC vs. transition if plts < 50k or HIT ab +  Best practice:  Diet: tube feeding Pain/Anxiety/Delirium protocol (if indicated): yes as above VAP protocol (if indicated): yes DVT prophylaxis: lovenox GI prophylaxis: famotidine Glucose control: SSI Mobility: bed rest Code Status: full Family Communication: will call today Disposition:  ICU  Labs   CBC: Recent Labs  Lab 03/07/19 0240 03/08/19 0020 03/09/19 0357 03/09/19 2354 03/10/19 0508 03/10/19 0529 03/10/19 1502 03/11/19 0500 03/11/19 0845  WBC 16.0* 15.3* 21.1*  --   --  17.7*  --  13.8*  --   HGB 19.0* 18.4* 17.2* 15.6 15.0 14.3 12.6* 12.1*  --   HCT 56.4* 54.1* 51.8 46.0 44.0 45.0 37.0* 38.2*  --   MCV 88.1 87.1 88.4  --   --  93.6  --  95.7  --   PLT 132* 124* 118*  --   --  87*  --  59* 60*    Basic Metabolic Panel: Recent Labs  Lab 03/06/19 0110 03/07/19 0240 03/08/19 0020 03/08/19 1947 03/09/19 0357 03/09/19 1657 03/09/19 2354 03/10/19 0508 03/10/19 0529 03/10/19 1502 03/11/19 0500  NA   < > 133* 133*  --  133*  --  135 137 136 137 140  K   < > 3.9 3.5  --  3.9  --  4.1 4.5 4.5 4.3 4.5  CL  --  87* 91*  --  95*  --   --   --  98  --  104  CO2  --  30 28  --  24  --   --   --  30  --  29  GLUCOSE  --  103* 135*  --  172*  --   --   --  193*  --  181*  BUN  --  49* 46*  --  57*  --   --   --  59*  --  47*  CREATININE  --  0.87 0.76  --  1.03  --   --   --  1.14  --  0.94  CALCIUM  --  8.6* 8.4*  --  8.1*  --   --   --  7.8*  --  7.9*  MG  --   --   --  2.5* 2.3 2.5*  --   --  2.5*  --  2.5*  PHOS  --    --   --  4.2 3.0 3.7  --   --  4.2  --   --    < > = values in this interval not displayed.   GFR: Estimated Creatinine Clearance: 77.8 mL/min (by C-G formula based on SCr of 0.94 mg/dL). Recent Labs  Lab 03/06/19 0110 03/08/19 0020 03/09/19 0357 03/09/19 0500 03/10/19  0529 03/11/19 0500  PROCALCITON 0.13  --   --  0.12  --   --   WBC 13.7* 15.3* 21.1*  --  17.7* 13.8*    Liver Function Tests: Recent Labs  Lab 03/06/19 0110 03/07/19 0240 03/08/19 0020 03/09/19 0357 03/10/19 0529  AST 34 35 30 39 27  ALT 40 44 34 32 21  ALKPHOS 153* 150* 132* 120 99  BILITOT 1.0 1.0 1.2 1.3* 0.4  PROT 6.9 7.0 6.7 6.0* 5.5*  ALBUMIN 2.7* 2.6* 2.6* 2.4* 1.7*   No results for input(s): LIPASE, AMYLASE in the last 168 hours. No results for input(s): AMMONIA in the last 168 hours.  ABG    Component Value Date/Time   PHART 7.278 (L) 03/10/2019 1502   PCO2ART 67.1 (HH) 03/10/2019 1502   PO2ART 41.0 (L) 03/10/2019 1502   HCO3 31.5 (H) 03/10/2019 1502   TCO2 34 (H) 03/10/2019 1502   O2SAT 69.0 03/10/2019 1502     Coagulation Profile: Recent Labs  Lab 03/11/19 0845  INR 1.3*    Cardiac Enzymes: No results for input(s): CKTOTAL, CKMB, CKMBINDEX, TROPONINI in the last 168 hours.  HbA1C: Hgb A1c MFr Bld  Date/Time Value Ref Range Status  02/27/2019 03:15 AM 6.8 (H) 4.8 - 5.6 % Final    Comment:    (NOTE) Pre diabetes:          5.7%-6.4% Diabetes:              >6.4% Glycemic control for   <7.0% adults with diabetes     CBG: Recent Labs  Lab 03/11/19 1230 03/11/19 1647 03/11/19 1959 03/11/19 2328 03/12/19 0344  GLUCAP 177* 304* 271* 222* 171*        The patient is critically ill with multiple organ systems failure and requires high complexity decision making for assessment and support, frequent evaluation and titration of therapies, application of advanced monitoring technologies and extensive interpretation of multiple databases. Critical Care Time devoted to  patient care services described in this note independent of APP/resident time (if applicable)  is 32 minutes.   Myrla Halstedan Ellias Mcelreath MD [place] [facility] 03/12/2019 7:53 AM Personal pager: [phone] If unanswered, please page CCM On-call: #[phone]

## 2019-03-12 NOTE — Progress Notes (Signed)
OT Cancellation Note  Patient Details Name: Alexander Erickson MRN: [id number] DOB: 1955/12/30   Cancelled Treatment:    Reason Eval/Treat Not Completed: Medical issues which prohibited therapy. Pt intubated and sedated. Pt has had a significant decline since OT and PT last saw him. OT to sign off at this time. Please re-order OT when pt is medically stable and able to participate in therapy.   Lyman Bishop 03/12/2019, 7:25 AM

## 2019-03-12 NOTE — Progress Notes (Signed)
Called and updated son.  Dan Akaash Vandewater MD 

## 2019-03-12 NOTE — Progress Notes (Signed)
Wife called and updated via telephone; video chatting also utilized for wife to see patient.

## 2019-03-12 NOTE — Progress Notes (Signed)
ANTICOAGULATION CONSULT NOTE - Follow Up Consult  Pharmacy Consult for Lovenox Indication: DVT  Allergies  Allergen Reactions  . Neomycin Other (See Comments) and Swelling    TOPICAL FORM ONLY.  . Other Other (See Comments)    Soy bean oil causes bronchial congestion, phlegm.  Animal dander.  Seasonal and environmental.  . Sulfur   . Thimerosal   . Apricot Flavor     dermatitis    Patient Measurements: Height: 5' 5.5" (166.4 cm) Weight: 174 lb 6.1 oz (79.1 kg) IBW/kg (Calculated) : 62.65  Vital Signs: Temp: 97.8 F (36.6 C) (12/22 0800) Temp Source: Oral (12/22 0800) BP: 122/58 (12/22 0800) Pulse Rate: 89 (12/22 0800)  Labs: Recent Labs    03/10/19 0529 03/11/19 0500 03/11/19 0845 03/12/19 0518 03/12/19 0831  HGB 14.3 12.1*  --  10.4* 10.9*  HCT 45.0 38.2*  --  35.1* 32.0*  PLT 87* 59* 60* 56*  --   APTT  --   --  41*  --   --   LABPROT  --   --  16.3*  --   --   INR  --   --  1.3*  --   --   CREATININE 1.14 0.94  --  1.32*  --     Estimated Creatinine Clearance: 55.4 mL/min (A) (by C-G formula based on SCr of 1.32 mg/dL (H)).   Medications:  Scheduled:  . artificial tears  1 application Both Eyes I9J  . vitamin C  500 mg Per Tube Daily  . chlorhexidine  15 mL Mouth/Throat BID  . Chlorhexidine Gluconate Cloth  6 each Topical Daily  . cholecalciferol  1,000 Units Per Tube Daily  . doxazosin  2 mg Per Tube Daily  . enoxaparin (LOVENOX) injection  80 mg Subcutaneous Q12H  . famotidine  20 mg Per Tube BID  . feeding supplement (PRO-STAT SUGAR FREE 64)  30 mL Per Tube Daily  . feeding supplement (PRO-STAT SUGAR FREE 64)  60 mL Per Tube BID  . feeding supplement (VITAL 1.5 CAL)  1,000 mL Per Tube Q24H  . insulin aspart  0-9 Units Subcutaneous Q4H  . mouth rinse  15 mL Mouth Rinse 10 times per day  . rosuvastatin  20 mg Per Tube q1800  . sodium chloride flush  3 mL Intravenous Q12H  . zinc sulfate  220 mg Per Tube Daily   Infusions:  . sodium chloride  Stopped (03/09/19 1121)  . cefTRIAXone (ROCEPHIN)  IV Stopped (03/11/19 1930)  . fentaNYL infusion INTRAVENOUS 250 mcg/hr (03/12/19 0700)  . norepinephrine (LEVOPHED) Adult infusion 8 mcg/min (03/12/19 0700)  . propofol (DIPRIVAN) infusion 45 mcg/kg/min (03/12/19 0713)  . vasopressin (PITRESSIN) infusion - *FOR SHOCK* 0.04 Units/min (03/12/19 0700)    Assessment: 63 yo M admitted with COVID, found to also have RLE DVT.  Pt continues on treatment dose Lovenox.   SCr up to 1.32. CBC: H/H has trended down. Plt remains low but stable today.   Bleeding:   12/18 Reported hemoptysis.  RN documents pt coughed up a bloody clot quarter size on 12/18 at 1800.  Per MD notes, Hold anticoagulation overnight. 12/19 No further bleeding or hemoptysis per RN/MD.  Dose already given on 12/19 at 0927. 12/22: Continued hemoptysis yesterday and AM Lovenox dose was held. Bleeding seems to have resolved today per RN. Nasal packing remains in place   Goal of Therapy:  Anti-Xa level 0.6-1 units/ml 4hrs after LMWH dose given Monitor platelets by anticoagulation protocol: Yes  Plan:  Continue Lovenox 1mg /kg q12h as ordered. Follow-up renal function, CBC, montior for s/sx bleeding.   , PharmD., BCPS Clinical Pharmacist Clinical phone for 03/12/19 until 5pm: [phone]

## 2019-03-12 NOTE — Progress Notes (Signed)
RT NOTE:  Pt's head turned to the right. ETT stable throughout @ 25cm.

## 2019-03-12 NOTE — Progress Notes (Signed)
Flushed R chest tube with 20 ml NS per Dr Tamala Julian.

## 2019-03-12 NOTE — Progress Notes (Signed)
Patient placed in prone position at this time.  ETT secured with cloth tape at 25 cm at the lip.  Patient tolerated well with no complications. VS within normal limits.  

## 2019-03-13 ENCOUNTER — Inpatient Hospital Stay (HOSPITAL_COMMUNITY): Payer: Medicaid Other

## 2019-03-13 LAB — CBC
HCT: 37.5 % — ABNORMAL LOW (ref 39.0–52.0)
Hemoglobin: 11.1 g/dL — ABNORMAL LOW (ref 13.0–17.0)
MCH: 30.2 pg (ref 26.0–34.0)
MCHC: 29.6 g/dL — ABNORMAL LOW (ref 30.0–36.0)
MCV: 102.2 fL — ABNORMAL HIGH (ref 80.0–100.0)
Platelets: 59 10*3/uL — ABNORMAL LOW (ref 150–400)
RBC: 3.67 MIL/uL — ABNORMAL LOW (ref 4.22–5.81)
RDW: 14.4 % (ref 11.5–15.5)
WBC: 10.9 10*3/uL — ABNORMAL HIGH (ref 4.0–10.5)
nRBC: 0.2 % (ref 0.0–0.2)

## 2019-03-13 LAB — POCT I-STAT 7, (LYTES, BLD GAS, ICA,H+H)
Acid-Base Excess: 3 mmol/L — ABNORMAL HIGH (ref 0.0–2.0)
Bicarbonate: 32.8 mmol/L — ABNORMAL HIGH (ref 20.0–28.0)
Calcium, Ion: 1.24 mmol/L (ref 1.15–1.40)
HCT: 32 % — ABNORMAL LOW (ref 39.0–52.0)
Hemoglobin: 10.9 g/dL — ABNORMAL LOW (ref 13.0–17.0)
O2 Saturation: 94 %
Patient temperature: 96.4
Potassium: 4.7 mmol/L (ref 3.5–5.1)
Sodium: 141 mmol/L (ref 135–145)
TCO2: 35 mmol/L — ABNORMAL HIGH (ref 22–32)
pCO2 arterial: 76 mmHg (ref 32.0–48.0)
pH, Arterial: 7.236 — ABNORMAL LOW (ref 7.350–7.450)
pO2, Arterial: 83 mmHg (ref 83.0–108.0)

## 2019-03-13 LAB — BASIC METABOLIC PANEL
Anion gap: 5 (ref 5–15)
BUN: 67 mg/dL — ABNORMAL HIGH (ref 8–23)
CO2: 33 mmol/L — ABNORMAL HIGH (ref 22–32)
Calcium: 8.1 mg/dL — ABNORMAL LOW (ref 8.9–10.3)
Chloride: 105 mmol/L (ref 98–111)
Creatinine, Ser: 1.27 mg/dL — ABNORMAL HIGH (ref 0.61–1.24)
GFR calc Af Amer: >60 mL/min (ref >60)
GFR calc non Af Amer: 59 mL/min — ABNORMAL LOW (ref >60)
Glucose, Bld: 195 mg/dL — ABNORMAL HIGH (ref 70–99)
Potassium: 4.7 mmol/L (ref 3.5–5.1)
Sodium: 143 mmol/L (ref 135–145)

## 2019-03-13 LAB — GLUCOSE, CAPILLARY
Glucose-Capillary: 194 mg/dL — ABNORMAL HIGH (ref 70–99)
Glucose-Capillary: 163 mg/dL — ABNORMAL HIGH (ref 70–99)
Glucose-Capillary: 141 mg/dL — ABNORMAL HIGH (ref 70–99)
Glucose-Capillary: 170 mg/dL — ABNORMAL HIGH (ref 70–99)
Glucose-Capillary: 167 mg/dL — ABNORMAL HIGH (ref 70–99)

## 2019-03-13 LAB — MAGNESIUM: Magnesium: 2.9 mg/dL — ABNORMAL HIGH (ref 1.7–2.4)

## 2019-03-13 LAB — TRIGLYCERIDES: Triglycerides: 288 mg/dL — ABNORMAL HIGH (ref <150)

## 2019-03-13 MED ORDER — PRO-STAT SUGAR FREE PO LIQD
60.0000 mL | Freq: Three times a day (TID) | ORAL | Status: DC
  Administered 2019-03-13 – 2019-03-16 (×10): 60 mL
  Filled 2019-03-13 (×10): qty 60

## 2019-03-13 MED ORDER — ALBUMIN HUMAN 25 % IV SOLN
25.0000 g | Freq: Four times a day (QID) | INTRAVENOUS | Status: AC
  Administered 2019-03-13 – 2019-03-14 (×4): 25 g via INTRAVENOUS
  Filled 2019-03-13: qty 100
  Filled 2019-03-13: qty 50
  Filled 2019-03-13: qty 100
  Filled 2019-03-13: qty 50
  Filled 2019-03-13: qty 100

## 2019-03-13 MED ORDER — CLONAZEPAM 1 MG PO TABS
1.0000 mg | ORAL_TABLET | Freq: Two times a day (BID) | ORAL | Status: DC
  Administered 2019-03-13 – 2019-03-16 (×8): 1 mg
  Filled 2019-03-13 (×8): qty 1

## 2019-03-13 MED ORDER — FUROSEMIDE 10 MG/ML IJ SOLN
20.0000 mg | Freq: Once | INTRAMUSCULAR | Status: AC
  Administered 2019-03-13: 20 mg via INTRAVENOUS
  Filled 2019-03-13: qty 2

## 2019-03-13 MED ORDER — FENTANYL CITRATE (PF) 2500 MCG/50ML IJ SOLN
0.0000 ug/h | INTRAMUSCULAR | Status: DC
  Administered 2019-03-13 – 2019-03-14 (×3): 350 ug/h via INTRAVENOUS
  Filled 2019-03-13 (×4): qty 50

## 2019-03-13 MED ORDER — OXYCODONE HCL 5 MG PO TABS
5.0000 mg | ORAL_TABLET | Freq: Four times a day (QID) | ORAL | Status: DC
  Administered 2019-03-13 – 2019-03-16 (×11): 5 mg via ORAL
  Filled 2019-03-13 (×11): qty 1

## 2019-03-13 MED ORDER — METOLAZONE 5 MG PO TABS
2.5000 mg | ORAL_TABLET | Freq: Once | ORAL | Status: AC
  Administered 2019-03-13: 08:00:00 2.5 mg via ORAL
  Filled 2019-03-13: qty 1

## 2019-03-13 MED ORDER — ADULT MULTIVITAMIN W/MINERALS CH
1.0000 | ORAL_TABLET | Freq: Every day | ORAL | Status: DC
  Administered 2019-03-13 – 2019-03-16 (×4): 1
  Filled 2019-03-13 (×4): qty 1

## 2019-03-13 MED ORDER — ACETAZOLAMIDE SODIUM 500 MG IJ SOLR
250.0000 mg | Freq: Once | INTRAMUSCULAR | Status: AC
  Administered 2019-03-13: 250 mg via INTRAVENOUS
  Filled 2019-03-13: qty 500

## 2019-03-13 NOTE — Progress Notes (Signed)
RT NOTE:  Pt's head turned to the left. ETT stable throughout @ 25cm.

## 2019-03-13 NOTE — Progress Notes (Signed)
RT obtained ABG. PH 7.236, CO2 76. Critical result called to DR. Sommer. No changes at this time.

## 2019-03-13 NOTE — Progress Notes (Signed)
Patient's head turned and arms rotated while prone.  Tolerated well.

## 2019-03-13 NOTE — Progress Notes (Deleted)
Spoke with MD Powell about pt's CP and low BP, MD stated to give morphine for CP but hold PO metoprolol, imdur, and nitro for now.  Still need to complete EKG. 

## 2019-03-13 NOTE — Progress Notes (Signed)
NAME:  Alexander Erickson, MRN:  [id number], DOB:  08/18/1955, LOS: 19 ADMISSION DATE:  2019/03/07, CONSULTATION DATE:  12/18 REFERRING MD:  Jerral Ralph, CHIEF COMPLAINT:  dyspnea   Brief History   63 y./o male admited with acute respiratory failure with hypoxemia due to COVID pneumonia, treated with heated high flow for days; developed hemoptysis and worsening respiratory failure requiring intubation on 12/18.  History of present illness   63 y/o male with acute respiratory failure with hypoxemia due to COVID pneumonia. Was admitted, treated with decadron, remdesivir, actemra.  There was concern for an IVC clot at one point.  This was ruled out with CT venogram.  He continued to be treated with heated high flow O2. Developed hemoptysis and worsening dyspnea/hypoxemia on 12/18 requiring intubation per his stated wishes.    Past Medical History  Hypertension Hyperlipidemia  Significant Hospital Events   11/30>> COVID test + 12/4>> admit to [facility] via [facility] ED 12/9>> transferred to ICU for heated high flow 12/11>> transferred back to PCU-heated high flow continued  12/17>> suspected to have superimposed bacterial pneumonia on top of COVID-19  12/19>> nasal packing for ongoing nose bleed, bilateral chest tubes placed for pneumothoraces and pneumomediastinum  Consults:  PCCM  Procedures:  12/18 ETT >  12/19 R pigtail > 12/19 L pigtail > 12/19 LIJ MML > 12/19 bilateral nares packing  Significant Diagnostic Tests:  12/6>> Doppler positive for right lower extremity DVT 12/9>> echo with no RV strain, but possible IVC thrombus 12/9>> CT venogram chest/abdomen/pelvis-no IVC thrombus  Micro Data:  12/5 Resp culture>OPF  Antimicrobials:  COVID-19 medications: Decadron 12/4 >12/17 Remdesivir 12/4 > 12/13 (10-day course) Actemra 12/6 x 1  Antibiotics: Vancomycin 12/17>> Cefepime 12/17>> Rocephin: 12/4>>12/8   Interim history/subjective:  No major events overnight.  Remains on  high amounts of PEEP and oxygen.  Objective   Blood pressure 108/82, pulse 71, temperature (!) 95.6 F (35.3 C), temperature source Axillary, resp. rate 16, height 5' 5.5" (1.664 m), weight 79.1 kg, SpO2 98 %.    Vent Mode: PRVC FiO2 (%):  [80 %-100 %] 100 % Set Rate:  [34 bmp] 34 bmp Vt Set:  [400 mL-490 mL] 490 mL PEEP:  [10 cmH20-12 cmH20] 10 cmH20 Plateau Pressure:  [32 cmH20-40 cmH20] 34 cmH20   Intake/Output Summary (Last 24 hours) at 03/13/2019 9211 Last data filed at 03/13/2019 0600 Gross per 24 hour  Intake 1969.31 ml  Output 1150 ml  Net 819.31 ml   Filed Weights   03/09/19 0405 03/10/19 0500 03/12/19 0500  Weight: 76.2 kg 78.8 kg 79.1 kg    Examination: GEN: elderly man on vent HEENT: ETT in place, no blood in secretions CV: RRR, ext warm PULM: Scattered rhonci, passive on vent GI: Soft, hypoactive BS EXT: No edema NEURO: paralyzed PSYCH: cannot assess SKIN: No rashes, bilateral pigtails in place without air leaks  ABG shows improved oxygenation on proning Platelets stable, hemoglobin stable, mild leukocytosis stable Blood glucoses are in acceptable range Renal function slightly down but stable Only 800 cc of urine output unfortunately, net -2.2 for admission   Resolved Hospital Problem list     Assessment & Plan:  COVID 19 pneumonia > severe acute worsening with hemoptysis- likely from nose bleed.  Complicated by pneumomedistinum and bilateral pneumothoraces from vent desynchrony 12/18-12/19 - ARDSnet PEEP/FiO2, attempt to limit driving pressures as allowed by lung compliance - VAP prevention bundle - 16/8h proning with A/a gradients < 150, adjust based on clinical response - Sedation to  minimize shearing forces created by ventilator assynchrony - Keep even as able with balanced diuresis as tolerated by renal function - Chest tubes to waterseal  12/23 - Low dose trial of diamox/lasix/metolazone with albumin supplementation - Check CXR - Check ABG  post supination  Nose bleeds> eased up with packing - Continue packing - Ceftriaxone for ppx while packing in place - Tentative removal 12/24 - Check AM CBC  DVT: - Continue lovenox for now  DM- A1c 6.8% on admission - Continue levemir, SSI; goal CBG 140-180  Thrombocytopenia- reactive in setting of critical illness.  No evidence of MAHA.  HIT neg. - Stable, continue lovenox  Best practice:  Diet: tube feeding Pain/Anxiety/Delirium protocol (if indicated): yes as above VAP protocol (if indicated): yes DVT prophylaxis: lovenox GI prophylaxis: famotidine Glucose control: SSI Mobility: bed rest Code Status: full Family Communication: will call today Disposition:  ICU  Labs   CBC: Recent Labs  Lab 03/09/19 0357 03/10/19 0529 03/11/19 0500 03/11/19 0845 03/12/19 0518 03/12/19 0831 03/13/19 0030 03/13/19 0435  WBC 21.1* 17.7* 13.8*  --  10.7*  --  10.9*  --   HGB 17.2* 14.3 12.1*  --  10.4* 10.9* 11.1* 10.9*  HCT 51.8 45.0 38.2*  --  35.1* 32.0* 37.5* 32.0*  MCV 88.4 93.6 95.7  --  100.0  --  102.2*  --   PLT 118* 87* 59* 60* 56*  --  59*  --     Basic Metabolic Panel: Recent Labs  Lab 03/08/19 0020 03/08/19 1947 03/09/19 0357 03/09/19 1657 03/10/19 0529 03/11/19 0500 03/12/19 0518 03/12/19 0831 03/13/19 0030 03/13/19 0435  NA  --   --  133*  --  136 140 141 142 143 141  K  --   --  3.9  --  4.5 4.5 4.2 4.4 4.7 4.7  CL  --   --  95*  --  98 104 102  --  105  --   CO2  --   --  24  --  30 29 32  --  33*  --   GLUCOSE  --   --  172*  --  193* 181* 216*  --  195*  --   BUN  --   --  57*  --  59* 47* 60*  --  67*  --   CREATININE  --   --  1.03  --  1.14 0.94 1.32*  --  1.27*  --   CALCIUM  --   --  8.1*  --  7.8* 7.9* 8.1*  --  8.1*  --   MG   < > 2.5* 2.3 2.5* 2.5* 2.5* 2.7*  --  2.9*  --   PHOS  --  4.2 3.0 3.7 4.2  --   --   --   --   --    < > = values in this interval not displayed.   GFR: Estimated Creatinine Clearance: 57.6 mL/min (A) (by C-G  formula based on SCr of 1.27 mg/dL (H)). Recent Labs  Lab 03/09/19 0500 03/10/19 0529 03/11/19 0500 03/12/19 0518 03/13/19 0030  PROCALCITON 0.12  --   --   --   --   WBC  --  17.7* 13.8* 10.7* 10.9*    Liver Function Tests: Recent Labs  Lab 03/07/19 0240 03/08/19 0020 03/09/19 0357 03/10/19 0529  AST 35 30 39 27  ALT 44 34 32 21  ALKPHOS 150* 132* 120 99  BILITOT 1.0 1.2 1.3* 0.4  PROT 7.0 6.7 6.0* 5.5*  ALBUMIN 2.6* 2.6* 2.4* 1.7*   No results for input(s): LIPASE, AMYLASE in the last 168 hours. No results for input(s): AMMONIA in the last 168 hours.  ABG    Component Value Date/Time   PHART 7.236 (L) 03/13/2019 0435   PCO2ART 76.0 (HH) 03/13/2019 0435   PO2ART 83.0 03/13/2019 0435   HCO3 32.8 (H) 03/13/2019 0435   TCO2 35 (H) 03/13/2019 0435   O2SAT 94.0 03/13/2019 0435     Coagulation Profile: Recent Labs  Lab 03/11/19 0845  INR 1.3*    Cardiac Enzymes: No results for input(s): CKTOTAL, CKMB, CKMBINDEX, TROPONINI in the last 168 hours.  HbA1C: Hgb A1c MFr Bld  Date/Time Value Ref Range Status  02/27/2019 03:15 AM 6.8 (H) 4.8 - 5.6 % Final    Comment:    (NOTE) Pre diabetes:          5.7%-6.4% Diabetes:              >6.4% Glycemic control for   <7.0% adults with diabetes     CBG: Recent Labs  Lab 03/12/19 1225 03/12/19 1534 03/12/19 1956 03/12/19 2311 03/13/19 0333  GLUCAP 201* 167* 153* 147* 194*        The patient is critically ill with multiple organ systems failure and requires high complexity decision making for assessment and support, frequent evaluation and titration of therapies, application of advanced monitoring technologies and extensive interpretation of multiple databases. Critical Care Time devoted to patient care services described in this note independent of APP/resident time (if applicable)  is 32 minutes.   Erskine Emery MD [facility] 03/13/2019 7:27 AM Personal pager: [phone] If unanswered,  please page CCM On-call: [phone]

## 2019-03-13 NOTE — Progress Notes (Signed)
RT NOTE:  Pts head turned to the left. Arms rotated. ETT secured throughout

## 2019-03-13 NOTE — Progress Notes (Signed)
RT NOTE:  Pt flipped to supine position. ETT secure throughout. ETT taped at 25cm lip.

## 2019-03-13 NOTE — Progress Notes (Signed)
Called and updated son.  Dan Demar Shad MD 

## 2019-03-13 NOTE — Progress Notes (Signed)
Nutrition Follow-up  DOCUMENTATION CODES:   Not applicable  INTERVENTION:   Tube Feeding:  -Vital 1.5 @ 40 ml/hr via OGT -60 ml Prostat TID -Provides 2040 kcals,  155 g protein and 733 ml of water Meets 100% of protein needs, 85% calorie needs  TF regimen and propofol at current rate providing 2583 total kcal/day (100 % of kcal needs)  Add MVI with Minerals daily   NUTRITION DIAGNOSIS:   Increased nutrient needs related to acute illness(COVID-19 infection) as evidenced by estimated needs.  Being addressed via TF   GOAL:   Patient will meet greater than or equal to 90% of their needs  Progressing  MONITOR:   Vent status, Labs, Weight trends, TF tolerance, I & O's  REASON FOR ASSESSMENT:   Consult, Ventilator Enteral/tube feeding initiation and management  ASSESSMENT:   63 y./o male admited with acute respiratory failure with hypoxemia due to COVID pneumonia, treated with heated high flow for days; developed hemoptysis and worsening respiratory failure requiring intubation on 12/18.  Patient is currently intubated on ventilator support MV: 16.8 L/min Temp (24hrs), Avg:97.5 F (36.4 C), Min:95.6 F (35.3 C), Max:98.7 F (37.1 C)  Propofol: 20.6 ml/hr  Vital 1.5 at 40 ml/hr, Pro-Stat 60 mL BID  Current wt 79 kg; current wt 77.5 kg  Noted new DTI to coccyx  Labs: Creatinine 1.27, BUN 67 Meds: Vitamin C, Vitamin D  Diet Order:   Diet Order    None      EDUCATION NEEDS:   No education needs have been identified at this time  Skin:  Skin Assessment: Reviewed RN Assessment  Last BM:  12/23  Height:   Ht Readings from Last 1 Encounters:  19-Mar-2019 5' 5.5" (1.664 m)    Weight:   Wt Readings from Last 1 Encounters:  03/12/19 79.1 kg    Ideal Body Weight:  61.8 kg  BMI:  Body mass index is 28.58 kg/m.  Estimated Nutritional Needs:   Kcal:  1308-6578 kcals  Protein:  120-150 g  Fluid:  2L/day  Kerman Passey MS, RDN, LDN, CNSC [phone] Pager  [phone] Weekend/On-Call Pager

## 2019-03-13 NOTE — Plan of Care (Signed)
Pt sedated and intubated, unable to update on POC 

## 2019-03-13 NOTE — Progress Notes (Signed)
Patient proned.  Tolerated well. 

## 2019-03-14 ENCOUNTER — Inpatient Hospital Stay (HOSPITAL_COMMUNITY): Payer: Medicaid Other

## 2019-03-14 DIAGNOSIS — R9431 Abnormal electrocardiogram [ECG] [EKG]: Secondary | ICD-10-CM

## 2019-03-14 LAB — POCT I-STAT 7, (LYTES, BLD GAS, ICA,H+H)
Acid-Base Excess: 5 mmol/L — ABNORMAL HIGH (ref 0.0–2.0)
Bicarbonate: 33.9 mmol/L — ABNORMAL HIGH (ref 20.0–28.0)
Calcium, Ion: 1.25 mmol/L (ref 1.15–1.40)
HCT: 27 % — ABNORMAL LOW (ref 39.0–52.0)
Hemoglobin: 9.2 g/dL — ABNORMAL LOW (ref 13.0–17.0)
O2 Saturation: 98 %
Patient temperature: 97.9
Potassium: 3.8 mmol/L (ref 3.5–5.1)
Sodium: 144 mmol/L (ref 135–145)
TCO2: 36 mmol/L — ABNORMAL HIGH (ref 22–32)
pCO2 arterial: 82.6 mmHg (ref 32.0–48.0)
pH, Arterial: 7.219 — ABNORMAL LOW (ref 7.350–7.450)
pO2, Arterial: 128 mmHg — ABNORMAL HIGH (ref 83.0–108.0)

## 2019-03-14 LAB — CBC
HCT: 32.5 % — ABNORMAL LOW (ref 39.0–52.0)
Hemoglobin: 9.7 g/dL — ABNORMAL LOW (ref 13.0–17.0)
MCH: 30.2 pg (ref 26.0–34.0)
MCHC: 29.8 g/dL — ABNORMAL LOW (ref 30.0–36.0)
MCV: 101.2 fL — ABNORMAL HIGH (ref 80.0–100.0)
Platelets: 57 10*3/uL — ABNORMAL LOW (ref 150–400)
RBC: 3.21 MIL/uL — ABNORMAL LOW (ref 4.22–5.81)
RDW: 14.6 % (ref 11.5–15.5)
WBC: 6.8 10*3/uL (ref 4.0–10.5)
nRBC: 0.7 % — ABNORMAL HIGH (ref 0.0–0.2)

## 2019-03-14 LAB — TRIGLYCERIDES: Triglycerides: 176 mg/dL — ABNORMAL HIGH (ref <150)

## 2019-03-14 LAB — BASIC METABOLIC PANEL
Anion gap: 4 — ABNORMAL LOW (ref 5–15)
BUN: 62 mg/dL — ABNORMAL HIGH (ref 8–23)
CO2: 25 mmol/L (ref 22–32)
Calcium: 6 mg/dL — CL (ref 8.9–10.3)
Chloride: 117 mmol/L — ABNORMAL HIGH (ref 98–111)
Creatinine, Ser: 1.06 mg/dL (ref 0.61–1.24)
GFR calc Af Amer: >60 mL/min (ref >60)
GFR calc non Af Amer: >60 mL/min (ref >60)
Glucose, Bld: 114 mg/dL — ABNORMAL HIGH (ref 70–99)
Potassium: 2.7 mmol/L — CL (ref 3.5–5.1)
Sodium: 146 mmol/L — ABNORMAL HIGH (ref 135–145)

## 2019-03-14 LAB — GLUCOSE, CAPILLARY
Glucose-Capillary: 135 mg/dL — ABNORMAL HIGH (ref 70–99)
Glucose-Capillary: 170 mg/dL — ABNORMAL HIGH (ref 70–99)
Glucose-Capillary: 179 mg/dL — ABNORMAL HIGH (ref 70–99)
Glucose-Capillary: 180 mg/dL — ABNORMAL HIGH (ref 70–99)
Glucose-Capillary: 185 mg/dL — ABNORMAL HIGH (ref 70–99)
Glucose-Capillary: 206 mg/dL — ABNORMAL HIGH (ref 70–99)

## 2019-03-14 LAB — MAGNESIUM: Magnesium: 2 mg/dL (ref 1.7–2.4)

## 2019-03-14 MED ORDER — CHLORHEXIDINE GLUCONATE CLOTH 2 % EX PADS
6.0000 | MEDICATED_PAD | Freq: Every day | CUTANEOUS | Status: DC
  Administered 2019-03-15 – 2019-03-16 (×2): 6 via TOPICAL

## 2019-03-14 MED ORDER — SODIUM CHLORIDE 0.9 % IV SOLN
1.0000 mg/h | INTRAVENOUS | Status: DC
  Administered 2019-03-14: 1 mg/h via INTRAVENOUS
  Administered 2019-03-15 – 2019-03-16 (×2): 3 mg/h via INTRAVENOUS
  Filled 2019-03-14 (×5): qty 5

## 2019-03-14 MED ORDER — VECURONIUM BROMIDE 10 MG IV SOLR
0.1000 mg/kg | INTRAVENOUS | Status: DC | PRN
  Administered 2019-03-14 – 2019-03-15 (×2): 8.5 mg via INTRAVENOUS
  Filled 2019-03-14 (×3): qty 10

## 2019-03-14 MED ORDER — EPINEPHRINE HCL 5 MG/250ML IV SOLN IN NS
0.5000 ug/min | INTRAVENOUS | Status: DC
  Administered 2019-03-15: 0.5 ug/min via INTRAVENOUS
  Filled 2019-03-14: qty 250

## 2019-03-14 MED ORDER — POTASSIUM CHLORIDE 20 MEQ/15ML (10%) PO SOLN
40.0000 meq | Freq: Once | ORAL | Status: AC
  Administered 2019-03-14: 08:00:00 40 meq
  Filled 2019-03-14: qty 30

## 2019-03-14 MED ORDER — FUROSEMIDE 10 MG/ML IJ SOLN
40.0000 mg | Freq: Once | INTRAMUSCULAR | Status: DC
  Filled 2019-03-14: qty 4

## 2019-03-14 MED ORDER — ARTIFICIAL TEARS OPHTHALMIC OINT
1.0000 "application " | TOPICAL_OINTMENT | Freq: Three times a day (TID) | OPHTHALMIC | Status: DC
  Administered 2019-03-14 – 2019-03-17 (×9): 1 via OPHTHALMIC

## 2019-03-14 MED ORDER — PROPOFOL 1000 MG/100ML IV EMUL
25.0000 ug/kg/min | INTRAVENOUS | Status: DC
  Administered 2019-03-14: 30 ug/kg/min via INTRAVENOUS
  Administered 2019-03-14: 25 ug/kg/min via INTRAVENOUS
  Administered 2019-03-15 (×4): 30 ug/kg/min via INTRAVENOUS
  Filled 2019-03-14 (×6): qty 100

## 2019-03-14 MED ORDER — HYDROMORPHONE HCL 1 MG/ML IJ SOLN: 1.0000 mg | Freq: Once | INTRAMUSCULAR | Status: DC

## 2019-03-14 MED ORDER — HYDROMORPHONE BOLUS VIA INFUSION
0.5000 mg | INTRAVENOUS | Status: DC | PRN
  Filled 2019-03-14: qty 1

## 2019-03-14 NOTE — Progress Notes (Signed)
I spoke with the pt's son and updated him to the events of the day. All of his questions were answered. He was greatly appreciative of the care being provided to his father.

## 2019-03-14 NOTE — Progress Notes (Signed)
RT NOTE:  Pt flipped to supine position. ETT taped and secure throughout. ETT taped at 25cm lip

## 2019-03-14 NOTE — Progress Notes (Signed)
RT NOTE:  Pts head turned to the Right. Arms rotated. ETT secured throughout

## 2019-03-14 NOTE — Progress Notes (Signed)
Increased FIO2 to 100%.  Patient was very dysynchronous with the vent and sedated to manage.  Will wean back down once comfortable.

## 2019-03-14 NOTE — Progress Notes (Signed)
NAME:  Alexander Erickson, MRN:  [id number], DOB:  08/11/1955, LOS: 37 ADMISSION DATE:  03/04/2019, CONSULTATION DATE:  12/18 REFERRING MD:  Sloan Leiter, CHIEF COMPLAINT:  Dyspnea   Brief History   63 y./o male admited with acute respiratory failure with hypoxemia due to COVID pneumonia, treated with heated high flow for days; developed hemoptysis and worsening respiratory failure requiring intubation on 12/18.  Past Medical History  Hypertension Hyperlipidemia  Significant Hospital Events   11/30>>COVID test + 12/4>>admit to [facility] via [facility] ED 12/9>>transferred to ICU for heated high flow 12/11>>transferred back to PCU-heated high flow continued  12/17>>suspected to have superimposed bacterial pneumonia on top of COVID-19  12/19>> nasal packing for ongoing nose bleed, bilateral chest tubes placed for pneumothoraces and pneumomediastinum  Consults:  PCCM  Procedures:  12/18 ETT >  12/19 R pigtail > 12/19 L pigtail > 12/19 LIJ MML > 12/19 bilateral nares packing  Significant Diagnostic Tests:  12/6>>Doppler positive for right lower extremity DVT 12/9>>echo with no RV strain, but possible IVC thrombus 12/9>>CT venogram chest/abdomen/pelvis-no IVC thrombus   Micro Data:  12/5 Resp > OPF 12/17   Antimicrobials:  COVID-19 medications: Decadron 12/4 >12/17 Remdesivir 12/4 > 12/13(10-day course) Actemra 12/6 x 1  Antibiotics: Vancomycin 12/17>> Cefepime 12/17>> Rocephin: 12/4>>12/8   Interim history/subjective:  Nose still packed, no nose bleeding Plateau running low 30's, PF ratio 160, on pressure control vent  Objective   Blood pressure 115/64, pulse 66, temperature 97.9 F (36.6 C), temperature source Oral, resp. rate 20, height 5' 5.5" (1.664 m), weight 84.9 kg, SpO2 94 %.    Vent Mode: PCV FiO2 (%):  [80 %-100 %] 80 % Set Rate:  [34 bmp] 34 bmp PEEP:  [10 cmH20] 10 cmH20 Plateau Pressure:  [35 [id number] cmH20] 36 cmH20   Intake/Output Summary  (Last 24 hours) at 03/14/2019 0759 Last data filed at 03/14/2019 1324 Gross per 24 hour  Intake 3599.21 ml  Output 2555 ml  Net 1044.21 ml   Filed Weights   03/10/19 0500 03/12/19 0500 03/14/19 0500  Weight: 78.8 kg 79.1 kg 84.9 kg    Examination:  General:  In bed on vent HENT: NCAT ETT in place PULM: Rhonchi B, vent supported breathing, bilateral chest tubes CV: RRR, no mgr GI: BS+, soft, nontender MSK: normal bulk and tone Neuro: sedated on vent    Resolved Hospital Problem list     Assessment & Plan:  COVID 19 pneumonia ARDS due to COVID pneumonia: improving oxygenation, PF ratio 160 today Continue mechanical ventilation per ARDS protocol Target TVol 6-8cc/kgIBW Target Plateau Pressure < 30cm H20 Target driving pressure less than 15 cm of water Target PaO2 55-65: titrate PEEP/FiO2 per protocol As long as PaO2 to FiO2 ratio is less than 1:150 position in prone position for 16 hours a day Check CVP daily if CVL in place Target CVP less than 4, diurese as necessary Ventilator associated pneumonia prevention protocol 12/24 continue pressure control, no prone, wean down FiO2 to maintain SaO2 > 85%  Need for sedation for mechanical ventilation: D/C PAD protocol Continue NIMB protocol today, will change order sets to reflect this Hopefully if oxygenation continues to improve we can go to PAD, lower RASS tomorrow Today continue RASS -4 to -5  Bilateral pneumothoraces Daily CXR Continue chest tubes to water seal  Epistaxis Remove packing today Stop ceftriaxone  DVT lovenox full dose  DM2 SSI detemir  Monitor accuchecks  Anemia Monitor for bleeding  Best practice:  Diet: tube feeding Pain/Anxiety/Delirium  protocol (if indicated): as above VAP protocol (if indicated): yes DVT prophylaxis: lovenox GI prophylaxis: famotidine Glucose control: SSI Mobility: bed rest Code Status: full Family Communication: I updated his son Fayrene FearingJames today by  phone Disposition: remain in ICU  Labs   CBC: Recent Labs  Lab 03/09/19 0357 03/10/19 0529 03/11/19 0500 03/11/19 0845 03/12/19 0518 03/12/19 0831 03/13/19 0030 03/13/19 0435 03/14/19 0543  WBC 21.1* 17.7* 13.8*  --  10.7*  --  10.9*  --   --   HGB 17.2* 14.3 12.1*  --  10.4* 10.9* 11.1* 10.9* 9.2*  HCT 51.8 45.0 38.2*  --  35.1* 32.0* 37.5* 32.0* 27.0*  MCV 88.4 93.6 95.7  --  100.0  --  102.2*  --   --   PLT 118* 87* 59* 60* 56*  --  59*  --   --     Basic Metabolic Panel: Recent Labs  Lab 03/08/19 0020 03/08/19 1947 03/09/19 0357 03/09/19 1657 03/10/19 0529 03/11/19 0500 03/12/19 0518 03/12/19 0831 03/13/19 0030 03/13/19 0435 03/14/19 0500 03/14/19 0543  NA  --   --  133*  --  136 140 141 142 143 141 146* 144  K  --   --  3.9  --  4.5 4.5 4.2 4.4 4.7 4.7 2.7* 3.8  CL  --   --  95*  --  98 104 102  --  105  --  117*  --   CO2  --   --  24  --  30 29 32  --  33*  --  25  --   GLUCOSE  --   --  172*  --  193* 181* 216*  --  195*  --  114*  --   BUN  --   --  57*  --  59* 47* 60*  --  67*  --  62*  --   CREATININE  --   --  1.03  --  1.14 0.94 1.32*  --  1.27*  --  1.06  --   CALCIUM  --   --  8.1*  --  7.8* 7.9* 8.1*  --  8.1*  --  6.0*  --   MG   < > 2.5* 2.3 2.5* 2.5* 2.5* 2.7*  --  2.9*  --  2.0  --   PHOS  --  4.2 3.0 3.7 4.2  --   --   --   --   --   --   --    < > = values in this interval not displayed.   GFR: Estimated Creatinine Clearance: 71.3 mL/min (by C-G formula based on SCr of 1.06 mg/dL). Recent Labs  Lab 03/09/19 0500 03/10/19 0529 03/11/19 0500 03/12/19 0518 03/13/19 0030  PROCALCITON 0.12  --   --   --   --   WBC  --  17.7* 13.8* 10.7* 10.9*    Liver Function Tests: Recent Labs  Lab 03/08/19 0020 03/09/19 0357 03/10/19 0529  AST 30 39 27  ALT 34 32 21  ALKPHOS 132* 120 99  BILITOT 1.2 1.3* 0.4  PROT 6.7 6.0* 5.5*  ALBUMIN 2.6* 2.4* 1.7*   No results for input(s): LIPASE, AMYLASE in the last 168 hours. No results for  input(s): AMMONIA in the last 168 hours.  ABG    Component Value Date/Time   PHART 7.219 (L) 03/14/2019 0543   PCO2ART 82.6 (HH) 03/14/2019 0543   PO2ART 128.0 (H) 03/14/2019 0543   HCO3 33.9 (  H) 03/14/2019 0543   TCO2 36 (H) 03/14/2019 0543   O2SAT 98.0 03/14/2019 0543     Coagulation Profile: Recent Labs  Lab 03/11/19 0845  INR 1.3*    Cardiac Enzymes: No results for input(s): CKTOTAL, CKMB, CKMBINDEX, TROPONINI in the last 168 hours.  HbA1C: Hgb A1c MFr Bld  Date/Time Value Ref Range Status  02/27/2019 03:15 AM 6.8 (H) 4.8 - 5.6 % Final    Comment:    (NOTE) Pre diabetes:          5.7%-6.4% Diabetes:              >6.4% Glycemic control for   <7.0% adults with diabetes     CBG: Recent Labs  Lab 03/13/19 1531 03/13/19 2005 03/14/19 0033 03/14/19 0412 03/14/19 0743  GLUCAP 170* 167* 185* 170* 179*     Critical care time: 37 minutes     Heber Chowchilla, MD [facility] Pager: [phone] Cell: [phone] If no response, call [phone]

## 2019-03-14 NOTE — Progress Notes (Signed)
[facility] Progress Note Patient Name: Alexander Erickson DOB: 1955/07/08 MRN: [id number]   Date of Service  03/14/2019  HPI/Events of Note  Severe hypoxemia. Patient is satting 83% on FiO2 1.0 and PEEP 16 cm H2O (off ARDSNet PEEP/FiO2 table due to bilateral pneumothoraces s/p bilateral chest tubes). They are not currently proned. There are some blood clots being suctioned from ETT but not copious secretions. The chest tubes are currently to water seal. I/O balance shows patient is net positive today and yesterday.  eICU Interventions  - STAT CXR - Lasix 40mg  IV. - Place chest tubes to -20cm H2O suction bilaterally. - If chest tube suction does not improve saturations, will prone patient. - If proning fails to improve saturations and CXR doesn't show pneumothorax, will increase PEEP.     Intervention Category Major Interventions: Hypoxemia - evaluation and management  Charlott Rakes 03/14/2019, 11:33 PM

## 2019-03-14 NOTE — Progress Notes (Signed)
CRITICAL VALUE ALERT  Critical Value:  K=2.7, Ca=6.0  Date & Time Notied:  03/14/19 @ 0736  Provider Notified: Lake Bells, MD & Mancheril RPH  Orders Received/Actions taken: K replaced; see MAR

## 2019-03-15 ENCOUNTER — Inpatient Hospital Stay (HOSPITAL_COMMUNITY): Payer: Medicaid Other

## 2019-03-15 DIAGNOSIS — J1289 Other viral pneumonia: Secondary | ICD-10-CM

## 2019-03-15 DIAGNOSIS — U071 COVID-19: Principal | ICD-10-CM

## 2019-03-15 LAB — BASIC METABOLIC PANEL
Anion gap: 13 (ref 5–15)
Anion gap: 8 (ref 5–15)
BUN: 99 mg/dL — ABNORMAL HIGH (ref 8–23)
BUN: 96 mg/dL — ABNORMAL HIGH (ref 8–23)
CO2: 31 mmol/L (ref 22–32)
CO2: 34 mmol/L — ABNORMAL HIGH (ref 22–32)
Calcium: 8.2 mg/dL — ABNORMAL LOW (ref 8.9–10.3)
Calcium: 7.6 mg/dL — ABNORMAL LOW (ref 8.9–10.3)
Chloride: 100 mmol/L (ref 98–111)
Chloride: 104 mmol/L (ref 98–111)
Creatinine, Ser: 2.09 mg/dL — ABNORMAL HIGH (ref 0.61–1.24)
Creatinine, Ser: 2.37 mg/dL — ABNORMAL HIGH (ref 0.61–1.24)
GFR calc Af Amer: 38 mL/min — ABNORMAL LOW (ref >60)
GFR calc Af Amer: 32 mL/min — ABNORMAL LOW (ref >60)
GFR calc non Af Amer: 32 mL/min — ABNORMAL LOW (ref >60)
GFR calc non Af Amer: 28 mL/min — ABNORMAL LOW (ref >60)
Glucose, Bld: 135 mg/dL — ABNORMAL HIGH (ref 70–99)
Glucose, Bld: 111 mg/dL — ABNORMAL HIGH (ref 70–99)
Potassium: 5.8 mmol/L — ABNORMAL HIGH (ref 3.5–5.1)
Potassium: 5.4 mmol/L — ABNORMAL HIGH (ref 3.5–5.1)
Sodium: 144 mmol/L (ref 135–145)
Sodium: 146 mmol/L — ABNORMAL HIGH (ref 135–145)

## 2019-03-15 LAB — CBC
HCT: 31.4 % — ABNORMAL LOW (ref 39.0–52.0)
Hemoglobin: 9.2 g/dL — ABNORMAL LOW (ref 13.0–17.0)
MCH: 30 pg (ref 26.0–34.0)
MCHC: 29.3 g/dL — ABNORMAL LOW (ref 30.0–36.0)
MCV: 102.3 fL — ABNORMAL HIGH (ref 80.0–100.0)
Platelets: 69 10*3/uL — ABNORMAL LOW (ref 150–400)
RBC: 3.07 MIL/uL — ABNORMAL LOW (ref 4.22–5.81)
RDW: 14.7 % (ref 11.5–15.5)
WBC: 8.2 10*3/uL (ref 4.0–10.5)
nRBC: 0.9 % — ABNORMAL HIGH (ref 0.0–0.2)

## 2019-03-15 LAB — CBC WITH DIFFERENTIAL/PLATELET
Abs Immature Granulocytes: 0.49 10*3/uL — ABNORMAL HIGH (ref 0.00–0.07)
Basophils Absolute: 0.1 10*3/uL (ref 0.0–0.1)
Basophils Relative: 1 %
Eosinophils Absolute: 0.2 10*3/uL (ref 0.0–0.5)
Eosinophils Relative: 2 %
HCT: 33.2 % — ABNORMAL LOW (ref 39.0–52.0)
Hemoglobin: 9.6 g/dL — ABNORMAL LOW (ref 13.0–17.0)
Immature Granulocytes: 6 %
Lymphocytes Relative: 10 %
Lymphs Abs: 0.9 10*3/uL (ref 0.7–4.0)
MCH: 29.9 pg (ref 26.0–34.0)
MCHC: 28.9 g/dL — ABNORMAL LOW (ref 30.0–36.0)
MCV: 103.4 fL — ABNORMAL HIGH (ref 80.0–100.0)
Monocytes Absolute: 0.4 10*3/uL (ref 0.1–1.0)
Monocytes Relative: 4 %
Neutro Abs: 6.8 10*3/uL (ref 1.7–7.7)
Neutrophils Relative %: 77 %
Platelets: 73 10*3/uL — ABNORMAL LOW (ref 150–400)
RBC: 3.21 MIL/uL — ABNORMAL LOW (ref 4.22–5.81)
RDW: 14.8 % (ref 11.5–15.5)
WBC: 8.8 10*3/uL (ref 4.0–10.5)
nRBC: 1.8 % — ABNORMAL HIGH (ref 0.0–0.2)

## 2019-03-15 LAB — POCT I-STAT 7, (LYTES, BLD GAS, ICA,H+H)
Acid-Base Excess: 7 mmol/L — ABNORMAL HIGH (ref 0.0–2.0)
Bicarbonate: 37.3 mmol/L — ABNORMAL HIGH (ref 20.0–28.0)
Calcium, Ion: 1.14 mmol/L — ABNORMAL LOW (ref 1.15–1.40)
HCT: 28 % — ABNORMAL LOW (ref 39.0–52.0)
Hemoglobin: 9.5 g/dL — ABNORMAL LOW (ref 13.0–17.0)
O2 Saturation: 87 %
Potassium: 5.7 mmol/L — ABNORMAL HIGH (ref 3.5–5.1)
Sodium: 142 mmol/L (ref 135–145)
TCO2: 40 mmol/L — ABNORMAL HIGH (ref 22–32)
pCO2 arterial: 95.2 mmHg (ref 32.0–48.0)
pH, Arterial: 7.201 — ABNORMAL LOW (ref 7.350–7.450)
pO2, Arterial: 68 mmHg — ABNORMAL LOW (ref 83.0–108.0)

## 2019-03-15 LAB — MAGNESIUM: Magnesium: 3 mg/dL — ABNORMAL HIGH (ref 1.7–2.4)

## 2019-03-15 LAB — GLUCOSE, CAPILLARY
Glucose-Capillary: 105 mg/dL — ABNORMAL HIGH (ref 70–99)
Glucose-Capillary: 106 mg/dL — ABNORMAL HIGH (ref 70–99)
Glucose-Capillary: 131 mg/dL — ABNORMAL HIGH (ref 70–99)
Glucose-Capillary: 146 mg/dL — ABNORMAL HIGH (ref 70–99)
Glucose-Capillary: 192 mg/dL — ABNORMAL HIGH (ref 70–99)
Glucose-Capillary: 222 mg/dL — ABNORMAL HIGH (ref 70–99)

## 2019-03-15 LAB — TRIGLYCERIDES: Triglycerides: 404 mg/dL — ABNORMAL HIGH (ref <150)

## 2019-03-15 MED ORDER — SODIUM BICARBONATE 8.4 % IV SOLN
50.0000 meq | INTRAVENOUS | Status: AC
  Administered 2019-03-15: 50 meq via INTRAVENOUS
  Filled 2019-03-15: qty 50

## 2019-03-15 MED ORDER — SODIUM CHLORIDE 0.9 % IV SOLN
2.0000 g | Freq: Two times a day (BID) | INTRAVENOUS | Status: DC
  Administered 2019-03-15 – 2019-03-16 (×2): 2 g via INTRAVENOUS
  Filled 2019-03-15 (×2): qty 2

## 2019-03-15 MED ORDER — STERILE WATER FOR INJECTION IV SOLN
INTRAVENOUS | Status: DC
  Filled 2019-03-15 (×7): qty 850

## 2019-03-15 MED ORDER — SODIUM BICARBONATE 8.4 % IV SOLN
50.0000 meq | Freq: Once | INTRAVENOUS | Status: AC
  Administered 2019-03-15: 50 meq via INTRAVENOUS
  Filled 2019-03-15: qty 50

## 2019-03-15 MED ORDER — PROTAMINE SULFATE 10 MG/ML IV SOLN
40.0000 mg | Freq: Once | INTRAVENOUS | Status: AC
  Administered 2019-03-15: 17:00:00 40 mg via INTRAVENOUS
  Filled 2019-03-15: qty 4

## 2019-03-15 MED ORDER — SODIUM CHLORIDE 0.9 % IV SOLN
2.0000 g | Freq: Three times a day (TID) | INTRAVENOUS | Status: DC
  Administered 2019-03-15: 2 g via INTRAVENOUS
  Filled 2019-03-15: qty 2

## 2019-03-15 MED ORDER — CISATRACURIUM BOLUS VIA INFUSION
0.1500 mg/kg | Freq: Once | INTRAVENOUS | Status: AC
  Administered 2019-03-15: 12.7 mg via INTRAVENOUS
  Filled 2019-03-15: qty 13

## 2019-03-15 MED ORDER — HYDROCORTISONE NA SUCCINATE PF 100 MG IJ SOLR
50.0000 mg | Freq: Four times a day (QID) | INTRAMUSCULAR | Status: DC
  Administered 2019-03-15 – 2019-03-17 (×8): 50 mg via INTRAVENOUS
  Filled 2019-03-15 (×8): qty 2

## 2019-03-15 MED ORDER — VANCOMYCIN HCL 1250 MG/250ML IV SOLN
1250.0000 mg | INTRAVENOUS | Status: DC
  Administered 2019-03-16: 1250 mg via INTRAVENOUS
  Filled 2019-03-15 (×2): qty 250

## 2019-03-15 MED ORDER — VANCOMYCIN HCL 1750 MG/350ML IV SOLN
1750.0000 mg | Freq: Once | INTRAVENOUS | Status: AC
  Administered 2019-03-15: 13:00:00 1750 mg via INTRAVENOUS
  Filled 2019-03-15: qty 350

## 2019-03-15 MED ORDER — SODIUM CHLORIDE 0.9 % IV SOLN
0.0000 ug/kg/min | INTRAVENOUS | Status: DC
  Administered 2019-03-15 – 2019-03-17 (×5): 3 ug/kg/min via INTRAVENOUS
  Filled 2019-03-15 (×7): qty 20

## 2019-03-15 NOTE — Progress Notes (Signed)
NAME:  Alexander Erickson, MRN:  [id number], DOB:  [birth year], LOS: 21 ADMISSION DATE:  03/04/2019, CONSULTATION DATE:  12/18 REFERRING MD:  Jerral RalphGhimire, CHIEF COMPLAINT:  Dyspnea   Brief History   63 y./o male admited with acute respiratory failure with hypoxemia due to COVID pneumonia, treated with heated high flow for days; developed hemoptysis and worsening respiratory failure requiring intubation on 12/18.  Past Medical History  Hypertension Hyperlipidemia  Significant Hospital Events   11/30>>COVID test + 12/4>>[facility] 12/[facility]>>transferred to ICU for heated high flow 12/11>>transferred back to PCU-heated high flow continued  12/17>>suspected to have superimposed bacterial pneumonia on top of COVID-19  12/19>> nasal packing for ongoing nose bleed, bilateral chest tubes placed for pneumothoraces and pneumomediastinum 12/24 replaced right nare packing 12/25 replaced left nare packing  Consults:  PCCM  Procedures:  12/18 ETT >  12/19 R pigtail > 12/19 L pigtail > 12/19 [facility] MML > 12/19 bilateral nares packing  Significant Diagnostic Tests:  12/6>>Doppler positive for right lower extremity DVT 12/9>>echo with no RV strain, but possible IVC thrombus 12/9>>CT venogram chest/abdomen/pelvis-no IVC thrombus   Micro Data:  12/5 Resp > OPF 12/17   Antimicrobials:  COVID-19 medications: Decadron 12/4 >12/17 Remdesivir 12/4 > 12/13(10-day course) Actemra 12/6 x 1  Antibiotics: Vancomycin 12/17>> Cefepime 12/17>> Rocephin: 12/4>>12/8   Interim history/subjective:   Worsening resp acidosis overnight Started on bicarbonate infusion Bleeding from nose again overnight  Objective   Blood pressure (!) 102/52, pulse (!) 102, temperature 97.8 F (36.6 C), resp. rate (!) 34, height 5' 5.5" (1.664 m), weight 86.1 kg, SpO2 95 %.    Vent Mode: PRVC FiO2 (%):  [70 %-100 %] 100 % Set Rate:  [34 bmp] 34 bmp Vt Set:  [440 mL-500 mL] 500 mL PEEP:  [10  cmH20-16 cmH20] 16 cmH20 Plateau Pressure:  [31 cmH20-49 cmH20] 46 cmH20   Intake/Output Summary (Last 24 hours) at 03/15/2019 [id number] Last data filed at 03/15/2019 0700 Gross per 24 hour  Intake 2665.5 ml  Output 1665 ml  Net 1000.5 ml   Filed Weights   03/12/19 0500 03/14/19 0500 03/15/19 0500  Weight: 79.1 kg 84.9 kg 86.1 kg    Examination:   General:  In bed on vent HENT: NCAT ETT in place, bleeding L nare PULM: wheezing, rhonchi B, vent supported breathing CV: RRR, no mgr GI: BS+, soft, nontender MSK: normal bulk and tone Neuro: sedated on vent    12/25 CXR improved aeration  Resolved Hospital Problem list     Assessment & Plan:  COVID 19 pneumonia ARDS due to COVID pneumonia: worsening resp acidosis overnight in pressure control, [facility] Continue mechanical ventilation per ARDS protocol Target TVol 6-8cc/kgIBW Target Plateau Pressure < 30cm H20 Target driving pressure less than 15 cm of water Target PaO2 55-65: titrate PEEP/FiO2 per protocol As long as PaO2 to FiO2 ratio is less than 1:150 position in prone position for 16 hours a day Check CVP daily if CVL in place Target CVP less than 4, diurese as necessary Ventilator associated pneumonia prevention protocol 12/25 continue NMB protocol, plan bronchoscopy to evaluate for airflow obstruction, will need to tolerate higher plateaus, move back to prone position to improve plateau, continue PRVC  Need for sedation for mechanical ventilation: Continue NMB protocol RASS target -4 to -5   New shock with resp and metabolic acidosis overnight, suspect acidosis primary cause, but unclear, doesn't look sepsis but considerably worse today; not hemorrhaging (though has epistaxis),  doesn't have pneumothoraces with chest tubes in place on this morning's CXR Bicarb infusion Wean off levophed, vasopressin for MAP > 65 Pan culture Empiric antibiotics Check CVP Check SvO2 Stress dose steroids  Bilateral  pneumothoraces Continue chest tube to water seal Add saline flushes (no resp variation on 12/25) Daily CXR  Epistaxis: worse  Will pack R nare Change both packings on 12/30  DVT lovenox full dose  DM2 SSI Detemir accuchecks  Anemia Monitor for bleeding Transfuse PRBC for Hgb < 7 gm/dL   Best practice:  Diet: tube feeding Pain/Anxiety/Delirium protocol (if indicated): as above VAP protocol (if indicated): yes DVT prophylaxis: lovenox GI prophylaxis: famotidine Glucose control: SSI Mobility: bed rest Code Status: full Family Communication:spoke to Fayrene Fearing this morning Disposition: remain in ICU  Labs   CBC: Recent Labs  Lab 03/10/19 0529 03/11/19 0500 03/11/19 0845 03/12/19 0518 03/12/19 0831 03/13/19 0030 03/13/19 0435 03/14/19 0500 03/14/19 0543  WBC 17.7* 13.8*  --  10.7*  --  10.9*  --  6.8  --   HGB 14.3 12.1*  --  10.4* 10.9* 11.1* 10.9* 9.7* 9.2*  HCT 45.0 38.2*  --  35.1* 32.0* 37.5* 32.0* 32.5* 27.0*  MCV 93.6 95.7  --  100.0  --  102.2*  --  101.2*  --   PLT 87* 59* 60* 56*  --  59*  --  57*  --     Basic Metabolic Panel: Recent Labs  Lab 03/08/19 1947 03/08/19 1947 03/09/19 0357 03/09/19 1657 03/10/19 0529 03/11/19 0500 03/12/19 0518 03/12/19 0831 03/13/19 0030 03/13/19 0435 03/14/19 0500 03/14/19 0543  NA  --    < > 133*  --  136 140 141 142 143 141 146* 144  K  --    < > 3.9  --  4.5 4.5 4.2 4.4 4.7 4.7 2.7* 3.8  CL  --    < > 95*  --  98 104 102  --  105  --  117*  --   CO2  --    < > 24  --  30 29 32  --  33*  --  25  --   GLUCOSE  --    < > 172*  --  193* 181* 216*  --  195*  --  114*  --   BUN  --    < > 57*  --  59* 47* 60*  --  67*  --  62*  --   CREATININE  --    < > 1.03  --  1.14 0.94 1.32*  --  1.27*  --  1.06  --   CALCIUM  --    < > 8.1*  --  7.8* 7.9* 8.1*  --  8.1*  --  6.0*  --   MG 2.5*  --  2.3 2.5* 2.5* 2.5* 2.7*  --  2.9*  --  2.0  --   PHOS 4.2  --  3.0 3.7 4.2  --   --   --   --   --   --   --    < > = values  in this interval not displayed.   GFR: Estimated Creatinine Clearance: 71.8 mL/min (by C-G formula based on SCr of 1.06 mg/dL). Recent Labs  Lab 03/09/19 0500 03/11/19 0500 03/12/19 0518 03/13/19 0030 03/14/19 0500  PROCALCITON 0.12  --   --   --   --   WBC  --  13.8* 10.7* 10.9* 6.8  Liver Function Tests: Recent Labs  Lab 03/09/19 0357 03/10/19 0529  AST 39 27  ALT 32 21  ALKPHOS 120 99  BILITOT 1.3* 0.4  PROT 6.0* 5.5*  ALBUMIN 2.4* 1.7*   No results for input(s): LIPASE, AMYLASE in the last 168 hours. No results for input(s): AMMONIA in the last 168 hours.  ABG    Component Value Date/Time   PHART 7.219 (L) 03/14/2019 0543   PCO2ART 82.6 (HH) 03/14/2019 0543   PO2ART 128.0 (H) 03/14/2019 0543   HCO3 33.9 (H) 03/14/2019 0543   TCO2 36 (H) 03/14/2019 0543   O2SAT 98.0 03/14/2019 0543     Coagulation Profile: Recent Labs  Lab 03/11/19 0845  INR 1.3*    Cardiac Enzymes: No results for input(s): CKTOTAL, CKMB, CKMBINDEX, TROPONINI in the last 168 hours.  HbA1C: Hgb A1c MFr Bld  Date/Time Value Ref Range Status  02/27/2019 03:15 AM 6.8 (H) 4.8 - 5.6 % Final    Comment:    (NOTE) Pre diabetes:          5.7%-6.4% Diabetes:              >6.4% Glycemic control for   <7.0% adults with diabetes     CBG: Recent Labs  Lab 03/14/19 1132 03/14/19 1557 03/14/19 2029 03/15/19 0044 03/15/19 0329  GLUCAP 206* 135* 180* 222* 131*     Critical care time: 32 minutes     Roselie Awkward, MD [place] P[facility]M Pager: [phone] Cell: [phone] If no response, call [phone]

## 2019-03-15 NOTE — Progress Notes (Signed)
eLink Physician-Brief Progress Note Patient Name: Alexander Erickson DOB: [birth year] MRN: [id number]   Date of Service  03/15/2019  HPI/Events of Note  Persistent resp acidosis on 7cc =440 / PRVC  eICU Interventions  Increase to 8cc , peak pr low 50s, may have to accept, he has BL chest tubes Add bicarb gtt to get him to 7.15 range ABG in 2 h     Intervention Category Major Interventions: Acid-Base disturbance - evaluation and management  Zoeie Ritter V. Shirlyn Savin 03/15/2019, 6:02 AM

## 2019-03-15 NOTE — Progress Notes (Signed)
ABG obtained by RT. PH 6.98, CO2 >97, PAO2 60. [place] and orders received to go up on PCV from 30 to 35. And Repeat ABG in 2 hours. ABG will not cross over into Epic because of CO2 being above reportable range.

## 2019-03-15 NOTE — Progress Notes (Signed)
RT obtained repeat ABG. PH 7.05, CO2 >97, PAO2 79. No other values are shown because CO2 is above reportable range. RT talked with DR. ALVA and order is to place patient on PRVC on his 7cc. RT will continue to monitor.

## 2019-03-15 NOTE — Procedures (Signed)
PCCM Video Bronchoscopy Procedure Note  The patient was informed of the risks (including but not limited to bleeding, infection, respiratory failure, lung injury, tooth/oral injury) and benefits of the procedure and gave consent, see chart.  Indication: ARDS, severe resp acidosis  Post Procedure Diagnosis: same  Location: [facility]  Condition pre procedure: critically ill, on vent  Medications for procedure: dilaudid and propofol infusions  Procedure description: The bronchoscope was introduced through the endotracheal tube and passed to the bilateral lungs to the level of the subsegmental bronchi throughout the tracheobronchial tree.  Airway exam revealed normal airways, no secretions, no blood, no obstruction  Procedures performed: none  Specimens sent: none  Condition post procedure: critically ill, on vent  EBL: none  Complications: none  Roselie Awkward, MD [place] PCCM Pager: [phone] Cell: [phone] If no response, call [phone]

## 2019-03-15 NOTE — Progress Notes (Signed)
RT obtained repeat ABG. PH 7.03, CO2 >97, PAO2 78. No other values are shown because CO2 is above reportable range. RT talked with DR. ALVA and order is to place patient on PRVC on his 8cc. RT will continue to monitor. REPEAT ABG AT 0800

## 2019-03-15 NOTE — Progress Notes (Signed)
[facility] PCCM  CXR images reviewed, worsening bilateral airspace disease consistent with either new bacterial pneumonia or worsening ARDS from [place] repeat prone Added empiric antibiotics I updated his son that his lungs were looking worse and his overall prognosis is worsening  Roselie Awkward, MD [place] PCCM Pager: [phone] Cell: [phone] If no response, call [phone]

## 2019-03-15 NOTE — Progress Notes (Signed)
[place] Progress Note Patient Name: Alexander Erickson DOB: May 27, 1955 MRN: [id number]   Date of Service  03/15/2019  HPI/Events of Note  As suspected, ABG shows a severe respiratory acidosis. Full ABG: 6.98/>97/60/--/BE --.  Prior to any adjustments, ventilator settings were PCV with PC 30, RR 34, PEEP 16, FiO2 1.0. Patient is supine. On these settings his TV was 340cc (which is < 6cc/kg).  eICU Interventions  Will give an additional amp of NaHCO3 to temporize while we attempt ventilator adjustments to compensate for this, however the prognosis is extremely poor.  We will paralyze the patient using continuous drip of cisatracurium, increase RR to 35, increase PC to 35 (accepting that this will result in high plateau pressures above goal since not doing so will result in death from acidosis).        Marily Lente Christyna Letendre 03/15/2019, 12:35 AM

## 2019-03-15 NOTE — Progress Notes (Signed)
VBG collected by RN through [employer]. PH 7.18 Co2 >97, O2 47, Rest is unreadable due to CO2. CCM MD made aware.

## 2019-03-15 NOTE — Progress Notes (Signed)
Lavaged pt with 10 cc saline flush per MD order. Moderate amount of white/yellow/pink tinged thick secretions suctioned out with no complications. VS within normal limits

## 2019-03-15 NOTE — Progress Notes (Signed)
[facility] PCCM  Continued epistaxis from nose despite packing Appears pale Remains in shock Check cbc stat Give protamine now Replace nasal packing with large size   Roselie Awkward, MD [place] PCCM Pager: [phone] Cell: [phone] If no response, call [phone]

## 2019-03-15 NOTE — Progress Notes (Signed)
[facility] PCCM  Still bleeding from nose after protamine, packing with large rhino-rockets.   Will give afrin. Will have [place] f/u CBC tonight and transfuse Hold lovenox Not stable enough for transfer for IR intervention  Roselie Awkward, MD [place] Pager: [phone] Cell: [phone] If no response, call [phone]

## 2019-03-15 NOTE — Progress Notes (Signed)
Patient started to desat, with agonal breathing and drop in BP at approx 2330 (12/24).  Float RN Gerald Stabs) proactively reached out to MD, RT and Camera operator. Patient was suctioned orally removing large blood clot and bilat chest tubes flushed resulting in better return flow from right chest tube. Patient's LEVO drip titrated from 2 mcg to 40 as needed during duration. EPI drip started, Bicarb pushes x2 given. Nimbex drip ordered after prn paralytic given. O2 Sats increased to 90 at this time from a low of about 78.  BPs improved, EPI now paused and LEVO down to 34 mcg at this time.   RT interventions also included ABGs and vent changes as instructed/recommended by RT.   Pt also found to have approx 750 ml tube feed/ residuals not re-fed and continuous TF held at this time.   Bronch not recommended due to pt's instability, Family contacted by MD, no change in code status at this time.   Current vent setting at: 100% O2, 16 peep, 34 resp rate, 35 PC above peep.

## 2019-03-15 NOTE — Progress Notes (Addendum)
ANTICOAGULATION & ANTIBIOTIC CONSULT NOTE - Follow Up Consult  Pharmacy Consult for Lovenox; Vancomycin + Cefepime Indication: DVT; Sepsis   Allergies  Allergen Reactions  . Neomycin Other (See Comments) and Swelling    TOPICAL FORM ONLY.  . Other Other (See Comments)    Soy bean oil causes bronchial congestion, phlegm.  Animal dander.  Seasonal and environmental.  . Sulfur   . Thimerosal   . Apricot Flavor     dermatitis    Patient Measurements: Height: 5' 5.5" (166.4 cm) Weight: 189 lb 13.1 oz (86.1 kg) IBW/kg (Calculated) : 62.65  Vital Signs: Temp: 97.8 F (36.6 C) (12/25 0555) Temp Source: Oral (12/25 0400) BP: 106/55 (12/25 0730) Pulse Rate: 97 (12/25 0730)  Labs: Recent Labs    03/13/19 0030 03/14/19 0500 03/14/19 0543 03/15/19 0822  HGB 11.1* 9.7* 9.2* 9.5*  HCT 37.5* 32.5* 27.0* 28.0*  PLT 59* 57*  --   --   CREATININE 1.27* 1.06  --   --     Estimated Creatinine Clearance: 71.8 mL/min (by C-G formula based on SCr of 1.06 mg/dL).   Medications:  Scheduled:  . artificial tears  1 application Both Eyes Q8H  . vitamin C  500 mg Per Tube Daily  . chlorhexidine  15 mL Mouth/Throat BID  . Chlorhexidine Gluconate Cloth  6 each Topical Daily  . cholecalciferol  1,000 Units Per Tube Daily  . clonazePAM  1 mg Per Tube BID  . doxazosin  2 mg Per Tube Daily  . enoxaparin (LOVENOX) injection  80 mg Subcutaneous Q12H  . famotidine  20 mg Per Tube BID  . feeding supplement (PRO-STAT SUGAR FREE 64)  60 mL Per Tube TID  . feeding supplement (VITAL 1.5 CAL)  1,000 mL Per Tube Q24H  . furosemide  40 mg Intravenous Once  .  HYDROmorphone (DILAUDID) injection  1 mg Intravenous Once  . insulin aspart  0-9 Units Subcutaneous Q4H  . insulin detemir  8 Units Subcutaneous BID  . mouth rinse  15 mL Mouth Rinse 10 times per day  . multivitamin with minerals  1 tablet Per Tube Daily  . oxyCODONE  5 mg Oral Q6H  . rosuvastatin  20 mg Per Tube q1800  . sodium chloride  flush  3 mL Intravenous Q12H  . zinc sulfate  220 mg Per Tube Daily   Infusions:  . sodium chloride Stopped (03/09/19 1121)  . cisatracurium (NIMBEX) infusion 3 mcg/kg/min (03/15/19 0600)  . epinephrine Stopped (03/15/19 0013)  . HYDROmorphone 3 mg/hr (03/15/19 0817)  . norepinephrine (LEVOPHED) Adult infusion 34 mcg/min (03/15/19 0600)  . propofol (DIPRIVAN) infusion Stopped (03/14/19 1537)  . propofol (DIPRIVAN) infusion 30 mcg/kg/min (03/15/19 0750)  .  sodium bicarbonate (isotonic) infusion in sterile water 75 mL/hr at 03/15/19 0650  . vasopressin (PITRESSIN) infusion - *FOR SHOCK* 0.04 Units/min (03/15/19 0600)    Assessment: 63 yo M admitted with COVID pneumonia.   AC: Found to also have RLE DVT.  Pt continues on treatment dose Lovenox. SCr has trended up to 2.09 today. CrCl remains > 30 mL/min. Of note patient's nasal packing was removed yesterday but had more bleeding from nose overnight. Both nares were packed again.   H/H remains low but stable today. Will check a Lovenox trough given bump in SCr.   ID: Patient with worsening shock and increasing vasopressor requirements concerning for sepsis. Pharmacy consulted to start empiric Cefepime and vancomycin.   Goal of Therapy:  Anti-Xa level 0.6-1 units/ml 4hrs after  LMWH dose given Monitor platelets by anticoagulation protocol: Yes   Plan:  Continue Lovenox 1mg /kg q12h as ordered. Check lovenox trough level today  Follow-up renal function, CBC, montior for s/sx bleeding.  Cefepime 2 gm IV Q 12 hours Vancomycin 1750 mg IV load, then start Vancomycin 1250 mg IV Q 48 hrs. Goal AUC 400-550. Expected AUC: 481 SCr used: 2.09 Monitor CBC, renal fx and cultures     Albertina Parr, PharmD., BCPS Clinical Pharmacist Clinical phone for 03/15/19 until 5pm: [phone]  Addendum: Now with more bleeding out of his nose. Discussed with MD. Protamine 40 mg x 1 dose per discussion with MD. Will hold further Lovenox doses and  reassess tomorrow. IF bleeding resolved, will switch to IV heparin.   Albertina Parr, PharmD., BCPS Clinical Pharmacist

## 2019-03-15 NOTE — Progress Notes (Signed)
eLink Physician-Brief Progress Note Patient Name: Tymarion Everard DOB: [birth year] MRN: [id number]   Date of Service  03/15/2019  HPI/Events of Note  Acute resp acidosis Per RT, returning Tv on PCV 35/PEEP 16 about 320, TVi= 460 Unable to pass suction cath every time ? Blood clot  eICU Interventions  Change to PRVC/ 7cc to ensure ventilation, now that paralysed, peak pr 45-50 range acceptable Rpt ABg in 67 m if tolerates     Intervention Category Major Interventions: Acid-Base disturbance - evaluation and management  Damarian Priola V. Lavonia Eager 03/15/2019, 2:37 AM

## 2019-03-15 NOTE — Progress Notes (Signed)
[facility] Progress Note Patient Name: Alexander Erickson DOB: 1956/01/18 MRN: [id number]   Date of Service  03/15/2019  HPI/Events of Note  Patient hypotensive to mid 75O systolic. This is prior to even attempting proning. Levophed requirement has doubled per RN. Sats difficult to keep in 80s.  eICU Interventions  This is a significant clinical decline. We will treat empirically for acidosis as at least one etiology of his worsened shock. Give 1 amp sodium bicarbonate and check ABG. I have also ordered epinephrine drip to supplement vaso/levo.  I do suspect that this situation may unfortunately deteriorate further. As such,  I have asked the RN to contact the family if possible to discuss DNR given that the patient is already on maximal support.     Intervention Category Major Interventions: Hypotension - evaluation and management  Marily Lente Cataleia Gade 03/15/2019, 12:02 AM

## 2019-03-15 NOTE — Progress Notes (Deleted)
Per CCM MD, drop peep to 10 to help with BP. Spo2 >75%. RN aware

## 2019-03-15 NOTE — Progress Notes (Signed)
Patients son updated by RN x 2 and MD x 3 throughout the day. Decision made to make patient DNR.  Patient prone @ 1630.  Protamine given for nasal bleeding. Rhino rockets inserted into bilateral nares.   Patient remains on vasopressors for pressure support.

## 2019-03-15 NOTE — Progress Notes (Signed)
Pt proned at this time, VS within normal limits. Pt having copious amounts of hemoptysis from his nose and mouth despite rhino rockets and gauze around nose. Towel placed under pt's face around proning pillow. RN aware. RT will keep close eye on pt

## 2019-03-15 NOTE — Progress Notes (Signed)
[facility] PCCM  I called his son again for the third time today. I let him know how sick he is despite multiple aggressive interventions to try to help him pull through this. I let him know that CPR would be ineffective in the event of a cardiac arrest.  He voiced undestanding. We plan to continue full support, vasopressors, OK to escalate care, but no CPR in the event of cardiac arrest.  He agreed.  Ruby Cola

## 2019-03-15 NOTE — Progress Notes (Signed)
Patients head repositioned. ETT secured. Tolerated well.

## 2019-03-15 NOTE — Progress Notes (Signed)
Spoke with and updated patient's son, Jeneen Rinks.

## 2019-03-16 ENCOUNTER — Inpatient Hospital Stay (HOSPITAL_COMMUNITY): Payer: Medicaid Other

## 2019-03-16 LAB — BASIC METABOLIC PANEL
Anion gap: 13 (ref 5–15)
BUN: 111 mg/dL — ABNORMAL HIGH (ref 8–23)
CO2: 27 mmol/L (ref 22–32)
Calcium: 6.4 mg/dL — CL (ref 8.9–10.3)
Chloride: 104 mmol/L (ref 98–111)
Creatinine, Ser: 2.74 mg/dL — ABNORMAL HIGH (ref 0.61–1.24)
GFR calc Af Amer: 27 mL/min — ABNORMAL LOW (ref >60)
GFR calc non Af Amer: 23 mL/min — ABNORMAL LOW (ref >60)
Glucose, Bld: 196 mg/dL — ABNORMAL HIGH (ref 70–99)
Potassium: 5.1 mmol/L (ref 3.5–5.1)
Sodium: 144 mmol/L (ref 135–145)

## 2019-03-16 LAB — CBC WITH DIFFERENTIAL/PLATELET
Abs Immature Granulocytes: 0.69 10*3/uL — ABNORMAL HIGH (ref 0.00–0.07)
Basophils Absolute: 0.1 10*3/uL (ref 0.0–0.1)
Basophils Relative: 1 %
Eosinophils Absolute: 0.1 10*3/uL (ref 0.0–0.5)
Eosinophils Relative: 1 %
HCT: 28 % — ABNORMAL LOW (ref 39.0–52.0)
Hemoglobin: 8.5 g/dL — ABNORMAL LOW (ref 13.0–17.0)
Immature Granulocytes: 9 %
Lymphocytes Relative: 7 %
Lymphs Abs: 0.5 10*3/uL — ABNORMAL LOW (ref 0.7–4.0)
MCH: 30.1 pg (ref 26.0–34.0)
MCHC: 30.4 g/dL (ref 30.0–36.0)
MCV: 99.3 fL (ref 80.0–100.0)
Monocytes Absolute: 0.2 10*3/uL (ref 0.1–1.0)
Monocytes Relative: 2 %
Neutro Abs: 6.3 10*3/uL (ref 1.7–7.7)
Neutrophils Relative %: 80 %
Platelets: 88 10*3/uL — ABNORMAL LOW (ref 150–400)
RBC: 2.82 MIL/uL — ABNORMAL LOW (ref 4.22–5.81)
RDW: 14.5 % (ref 11.5–15.5)
WBC: 7.8 10*3/uL (ref 4.0–10.5)
nRBC: 1.4 % — ABNORMAL HIGH (ref 0.0–0.2)

## 2019-03-16 LAB — POCT I-STAT 7, (LYTES, BLD GAS, ICA,H+H)
Acid-Base Excess: 7 mmol/L — ABNORMAL HIGH (ref 0.0–2.0)
Acid-Base Excess: 6 mmol/L — ABNORMAL HIGH (ref 0.0–2.0)
Bicarbonate: 33.9 mmol/L — ABNORMAL HIGH (ref 20.0–28.0)
Bicarbonate: 35 mmol/L — ABNORMAL HIGH (ref 20.0–28.0)
Calcium, Ion: 1.02 mmol/L — ABNORMAL LOW (ref 1.15–1.40)
Calcium, Ion: 1.01 mmol/L — ABNORMAL LOW (ref 1.15–1.40)
HCT: 27 % — ABNORMAL LOW (ref 39.0–52.0)
HCT: 25 % — ABNORMAL LOW (ref 39.0–52.0)
Hemoglobin: 9.2 g/dL — ABNORMAL LOW (ref 13.0–17.0)
Hemoglobin: 8.5 g/dL — ABNORMAL LOW (ref 13.0–17.0)
O2 Saturation: 82 %
O2 Saturation: 92 %
Patient temperature: 99.5
Potassium: 5.8 mmol/L — ABNORMAL HIGH (ref 3.5–5.1)
Potassium: 5.7 mmol/L — ABNORMAL HIGH (ref 3.5–5.1)
Sodium: 140 mmol/L (ref 135–145)
Sodium: 139 mmol/L (ref 135–145)
TCO2: 36 mmol/L — ABNORMAL HIGH (ref 22–32)
TCO2: 38 mmol/L — ABNORMAL HIGH (ref 22–32)
pCO2 arterial: 63.5 mmHg — ABNORMAL HIGH (ref 32.0–48.0)
pCO2 arterial: 85.7 mmHg (ref 32.0–48.0)
pH, Arterial: 7.338 — ABNORMAL LOW (ref 7.350–7.450)
pH, Arterial: 7.219 — ABNORMAL LOW (ref 7.350–7.450)
pO2, Arterial: 53 mmHg — ABNORMAL LOW (ref 83.0–108.0)
pO2, Arterial: 82 mmHg — ABNORMAL LOW (ref 83.0–108.0)

## 2019-03-16 LAB — GLUCOSE, CAPILLARY
Glucose-Capillary: 226 mg/dL — ABNORMAL HIGH (ref 70–99)
Glucose-Capillary: 218 mg/dL — ABNORMAL HIGH (ref 70–99)
Glucose-Capillary: 211 mg/dL — ABNORMAL HIGH (ref 70–99)
Glucose-Capillary: 233 mg/dL — ABNORMAL HIGH (ref 70–99)
Glucose-Capillary: 236 mg/dL — ABNORMAL HIGH (ref 70–99)

## 2019-03-16 LAB — TRIGLYCERIDES: Triglycerides: 373 mg/dL — ABNORMAL HIGH (ref <150)

## 2019-03-16 MED ORDER — MIDAZOLAM BOLUS VIA INFUSION
1.0000 mg | INTRAVENOUS | Status: DC | PRN
  Filled 2019-03-16: qty 2

## 2019-03-16 MED ORDER — FAMOTIDINE 40 MG/5ML PO SUSR: 20.0000 mg | Freq: Every day | ORAL | Status: DC

## 2019-03-16 MED ORDER — OXYCODONE HCL 5 MG PO TABS
5.0000 mg | ORAL_TABLET | Freq: Four times a day (QID) | ORAL | Status: DC
  Administered 2019-03-16 – 2019-03-17 (×4): 5 mg
  Filled 2019-03-16 (×4): qty 1

## 2019-03-16 MED ORDER — SODIUM CHLORIDE 0.9 % IV SOLN
2.0000 g | INTRAVENOUS | Status: DC
  Administered 2019-03-17: 2 g via INTRAVENOUS
  Filled 2019-03-16: qty 2

## 2019-03-16 MED ORDER — MIDAZOLAM 50MG/50ML (1MG/ML) PREMIX INFUSION
2.0000 mg/h | INTRAVENOUS | Status: DC
  Administered 2019-03-16 – 2019-03-17 (×3): 4 mg/h via INTRAVENOUS
  Filled 2019-03-16 (×3): qty 50

## 2019-03-16 NOTE — Progress Notes (Addendum)
Patient placed in supine position.  ETT secured 25 cm @ lip with Holister tube holder. Completed 12 hrs. Tolerated well. RN x3 RT x2 at bedside.

## 2019-03-16 NOTE — Progress Notes (Signed)
NAME:  Alexander Erickson, MRN:  [id number], DOB:  07-30-1955, LOS: 22 ADMISSION DATE:  03/08/2019, CONSULTATION DATE:  12/18 REFERRING MD:  Jerral Ralph, CHIEF COMPLAINT:  Dyspnea   Brief History   63 y./o male admited with acute respiratory failure with hypoxemia due to COVID pneumonia, treated with heated high flow for days; developed hemoptysis and worsening respiratory failure requiring intubation on 12/18.  Past Medical History  Hypertension Hyperlipidemia  Significant Hospital Events   11/30>>COVID test + 12/4>>admit to [facility] via [facility] ED 12/9>>transferred to ICU for heated high flow 12/11>>transferred back to PCU-heated high flow continued  12/17>>suspected to have superimposed bacterial pneumonia on top of COVID-19  12/19>> nasal packing for ongoing nose bleed, bilateral chest tubes placed for pneumothoraces and pneumomediastinum 12/24 replaced right nare packing 12/25 replaced left nare packing  Consults:  PCCM  Procedures:  12/18 ETT >  12/19 R pigtail > 12/19 L pigtail > 12/19 [facility] MML > 12/19 bilateral nares packing  Significant Diagnostic Tests:  12/6>>Doppler positive for right lower extremity DVT 12/9>>echo with no RV strain, but possible IVC thrombus 12/9>>CT venogram chest/abdomen/pelvis-no IVC thrombus   Micro Data:  12/5 Resp > OPF 12/17  12/25 blood >  12/25 resp >   Antimicrobials:  COVID-19 medications: Decadron 12/4 >12/17 Remdesivir 12/4 > 12/13(10-day course) Actemra 12/6 x 1  Antibiotics: Vancomycin 12/17>> Cefepime 12/17>> Rocephin: 12/4>>12/8   Interim history/subjective:   Renal function worse today Still having nose bleeding Moved back to supine position overnight  Objective   Blood pressure 127/65, pulse 95, temperature (!) 100.5 F (38.1 C), temperature source Axillary, resp. rate (!) 34, height 5' 5.5" (1.664 m), weight 87.2 kg, SpO2 95 %. CVP:  [16 mmHg-21 mmHg] 20 mmHg  Vent Mode: PRVC FiO2 (%):  [70 %] 70  % Set Rate:  [34 bmp] 34 bmp Vt Set:  [500 mL] 500 mL PEEP:  [16 cmH20] 16 cmH20 Plateau Pressure:  [40 cmH20-50 cmH20] 50 cmH20   Intake/Output Summary (Last 24 hours) at 03/16/2019 1341 Last data filed at 03/16/2019 1200 Gross per 24 hour  Intake 5010.97 ml  Output 880 ml  Net 4130.97 ml   Filed Weights   03/14/19 0500 03/15/19 0500 03/16/19 0500  Weight: 84.9 kg 86.1 kg 87.2 kg    Examination:  General:  In bed on vent HENT: NCAT ETT in place, nasal packing, some bleeding from mouth PULM: Crackles bases B, vent supported breathing CV: RRR, no mgr GI: BS+, soft, nontender MSK: normal bulk and tone Neuro: sedated on vent   12/25 CXR improved aeration  Resolved Hospital Problem list     Assessment & Plan:  COVID 19 pneumonia ARDS due to COVID pneumonia: worsening resp acidosis 12/25 in pressure control, moved back to [facility], remains profoundly hypoxemic 12/26, little signs of progress now complicated by multi-organ failure Continue mechanical ventilation per ARDS protocol Target TVol 6-8cc/kgIBW Target Plateau Pressure < 30cm H20 Target driving pressure less than 15 cm of water Target PaO2 55-65: titrate PEEP/FiO2 per protocol As long as PaO2 to FiO2 ratio is less than 1:150 position in prone position for 16 hours a day Check CVP daily if CVL in place Target CVP less than 4, diurese as necessary Ventilator associated pneumonia prevention protocol 12/25 continue NMB overnight, no prone given increase in nose bleeding in that position  Need for sedation for mechanical ventilation: Continue NMB protocol RASS target -4 to -5  Acute on chronic kidney diseae with oliguria> worse 12/26 Not a good dialysis  candidate with multi-organ failure  New shock with resp and metabolic acidosis 12/25 ,septic shock? Stop bicarb gtt  Bilateral pneumothoraces Continue chest tubes, daily CXR  Epistaxis: worse in prone position and with blood thinners Hold lovenox Afrin Continue  packing through 12/29  DVT Hold lovenox  DM2 SSI detemir accuchecks  Anemia with epistaxis Monitor bleeding Transfuse PRBC for Hgb < 7 gm/dL  Overall prognosis poor. Discussed the severity of his multi-organ failure on 12/26 and explained that he will need weeks to months of recovery even in the unlikely scenario that he survives this ICU stay.  His son doesn't feel that he would want that and understands the severity of this.  We decided to give him another 24 hours of supportive care but not go on HD if needed.  May need to withdraw care tomorrow.   Best practice:  Diet: tube feeding Pain/Anxiety/Delirium protocol (if indicated): as above VAP protocol (if indicated): yes DVT prophylaxis: lovenox GI prophylaxis: famotidine Glucose control: SSI Mobility: bed rest Code Status: full Family Communication:[place] Disposition: remain in ICU  Labs   CBC: Recent Labs  Lab 03/13/19 0030 03/14/19 0500 03/15/19 0606 03/15/19 0822 03/15/19 1716 03/16/19 0545 03/16/19 0750  WBC 10.9* 6.8 8.8  --  8.2 7.8  --   NEUTROABS  --   --  6.8  --   --  6.3  --   HGB 11.1* 9.7* 9.6* 9.5* 9.2* 8.5* 9.2*  HCT 37.5* 32.5* 33.2* 28.0* 31.4* 28.0* 27.0*  MCV 102.2* 101.2* 103.4*  --  102.3* 99.3  --   PLT 59* 57* 73*  --  69* 88*  --     Basic Metabolic Panel: Recent Labs  Lab 03/09/19 1657 03/09/19 1955 03/10/19 0529 03/11/19 0500 03/12/19 0518 03/13/19 0030 03/14/19 0500 03/15/19 0606 03/15/19 0822 03/15/19 1227 03/16/19 0545 03/16/19 0750  NA  --   --  136 140 141 143 146* 144 142 146* 144 140  K  --   --  4.5 4.5 4.2 4.7 2.7* 5.8* 5.7* 5.4* 5.1 5.8*  CL  --    < > 98 104 102 105 117* 100  --  104 104  --   CO2  --    < > 30 29 32 33* 25 31  --  34* 27  --   GLUCOSE  --    < > 193* 181* 216* 195* 114* 135*  --  111* 196*  --   BUN  --    < > 59* 47* 60* 67* 62* 99*  --  96* 111*  --   CREATININE  --    < > 1.14 0.94 1.32* 1.27* 1.06 2.09*  --  2.37* 2.74*  --    CALCIUM  --    < > 7.8* 7.9* 8.1* 8.1* 6.0* 8.2*  --  7.6* 6.4*  --   MG 2.5*  --  2.5* 2.5* 2.7* 2.9* 2.0 3.0*  --   --   --   --   PHOS 3.7  --  4.2  --   --   --   --   --   --   --   --   --    < > = values in this interval not displayed.   GFR: Estimated Creatinine Clearance: 27.9 mL/min (A) (by C-G formula based on SCr of 2.74 mg/dL (H)). Recent Labs  Lab 03/14/19 0500 03/15/19 0606 03/15/19 1716 03/16/19 0545  WBC 6.8 8.8 8.2 7.8  Liver Function Tests: Recent Labs  Lab 03/10/19 0529  AST 27  ALT 21  ALKPHOS 99  BILITOT 0.4  PROT 5.5*  ALBUMIN 1.7*   No results for input(s): LIPASE, AMYLASE in the last 168 hours. No results for input(s): AMMONIA in the last 168 hours.  ABG    Component Value Date/Time   PHART 7.338 (L) 03/16/2019 0750   PCO2ART 63.5 (H) 03/16/2019 0750   PO2ART 53.0 (L) 03/16/2019 0750   HCO3 33.9 (H) 03/16/2019 0750   TCO2 36 (H) 03/16/2019 0750   O2SAT 82.0 03/16/2019 0750     Coagulation Profile: Recent Labs  Lab 03/11/19 0845  INR 1.3*    Cardiac Enzymes: No results for input(s): CKTOTAL, CKMB, CKMBINDEX, TROPONINI in the last 168 hours.  HbA1C: Hgb A1c MFr Bld  Date/Time Value Ref Range Status  02/27/2019 03:15 AM 6.8 (H) 4.8 - 5.6 % Final    Comment:    (NOTE) Pre diabetes:          5.7%-6.4% Diabetes:              >6.4% Glycemic control for   <7.0% adults with diabetes     CBG: Recent Labs  Lab 03/15/19 1727 03/15/19 2106 03/16/19 0027 03/16/19 0543 03/16/19 1125  GLUCAP 146* 192* 226* 218* 211*     Critical care time: 45 minutes     Roselie Awkward, MD [place] [facility] Pager: [phone] Cell: [phone] If no response, call [phone]

## 2019-03-16 NOTE — Progress Notes (Signed)
ANTICOAGULATION & ANTIBIOTIC CONSULT NOTE - Follow Up Consult  Pharmacy Consult for Lovenox; Vancomycin + Cefepime Indication: DVT; Sepsis   Allergies  Allergen Reactions  . Neomycin Other (See Comments) and Swelling    TOPICAL FORM ONLY.  . Other Other (See Comments)    Soy bean oil causes bronchial congestion, phlegm.  Animal dander.  Seasonal and environmental.  . Sulfur   . Thimerosal   . Apricot Flavor     dermatitis    Patient Measurements: Height: 5' 5.5" (166.4 cm) Weight: 192 lb 3.9 oz (87.2 kg) IBW/kg (Calculated) : 62.65  Vital Signs: Temp: 100.5 F (38.1 C) (12/26 1100) Temp Source: Axillary (12/26 1100) BP: 127/65 (12/26 1200) Pulse Rate: 95 (12/26 1200)  Labs: Recent Labs    03/15/19 0606 03/15/19 1227 03/15/19 1716 03/16/19 0545 03/16/19 0750  HGB 9.6*  --  9.2* 8.5* 9.2*  HCT 33.2*  --  31.4* 28.0* 27.0*  PLT 73*  --  69* 88*  --   CREATININE 2.09* 2.37*  --  2.74*  --     Estimated Creatinine Clearance: 27.9 mL/min (A) (by C-G formula based on SCr of 2.74 mg/dL (H)).   Medications:  Scheduled:  . artificial tears  1 application Both Eyes E9F  . vitamin C  500 mg Per Tube Daily  . chlorhexidine  15 mL Mouth/Throat BID  . Chlorhexidine Gluconate Cloth  6 each Topical Daily  . cholecalciferol  1,000 Units Per Tube Daily  . clonazePAM  1 mg Per Tube BID  . famotidine  20 mg Per Tube BID  . feeding supplement (PRO-STAT SUGAR FREE 64)  60 mL Per Tube TID  . feeding supplement (VITAL 1.5 CAL)  1,000 mL Per Tube Q24H  . furosemide  40 mg Intravenous Once  . hydrocortisone sodium succinate  50 mg Intravenous Q6H  .  HYDROmorphone (DILAUDID) injection  1 mg Intravenous Once  . insulin aspart  0-9 Units Subcutaneous Q4H  . insulin detemir  8 Units Subcutaneous BID  . mouth rinse  15 mL Mouth Rinse 10 times per day  . multivitamin with minerals  1 tablet Per Tube Daily  . oxyCODONE  5 mg Per Tube Q6H  . rosuvastatin  20 mg Per Tube q1800  .  sodium chloride flush  3 mL Intravenous Q12H  . zinc sulfate  220 mg Per Tube Daily   Infusions:  . sodium chloride Stopped (03/09/19 1121)  . [START ON 04-08-19] ceFEPime (MAXIPIME) IV    . cisatracurium (NIMBEX) infusion 3 mcg/kg/min (03/16/19 1200)  . epinephrine Stopped (03/15/19 0013)  . HYDROmorphone 3 mg/hr (03/16/19 1200)  . midazolam    . norepinephrine (LEVOPHED) Adult infusion 30 mcg/min (03/16/19 1400)  . vancomycin    . vasopressin (PITRESSIN) infusion - *FOR SHOCK* 0.04 Units/min (03/16/19 1200)    Assessment: 63 yo M admitted with COVID pneumonia.   AC: Found to also have RLE DVT.  Anticoagulation was held yesterday and nasal packing was replaced. Continues to have some bleeding from nose today. H/H stable, Plt low but stable.   ID: Patient with worsening shock and increasing vasopressor requirements concerning for sepsis. Pharmacy consulted to start empiric Cefepime and vancomycin.   Goal of Therapy:  Anti-Xa level 0.6-1 units/ml 4hrs after LMWH dose given Monitor platelets by anticoagulation protocol: Yes   Plan:  Continue holding anticoagulation  Switch to IV heparin once bleeding has resolved   Decrease Cefepime to 2 gm IV Q 24 hours  Vancomycin 1750 mg  IV load, then start Vancomycin 1250 mg IV Q 48 hrs. Goal AUC 400-550. Expected AUC: 481 SCr used: 2.09 Monitor CBC, renal fx and cultures     Vinnie Level, PharmD., BCPS Clinical Pharmacist Clinical phone for 03/16/19 until 5pm: [phone]

## 2019-03-16 NOTE — Progress Notes (Signed)
ABG results given to Dr. Lake Bells at pt bedside. No new orders at this time. Rt will continue to monitor.

## 2019-03-16 NOTE — Progress Notes (Signed)
Patient remains proned. Head repositioned. Arms rotated. Tolerated well with no complications.

## 2019-03-16 NOTE — Progress Notes (Signed)
Per Dr. Lake Bells decrease pt to 7cc/kg VT and decrease RR from 35 to 30. ABG to be drawn in an hour. RT will continue to monitor.

## 2019-03-17 ENCOUNTER — Inpatient Hospital Stay (HOSPITAL_COMMUNITY): Payer: Medicaid Other

## 2019-03-17 LAB — CBC WITH DIFFERENTIAL/PLATELET
Abs Immature Granulocytes: 1.11 10*3/uL — ABNORMAL HIGH (ref 0.00–0.07)
Basophils Absolute: 0.1 10*3/uL (ref 0.0–0.1)
Basophils Relative: 1 %
Eosinophils Absolute: 0 10*3/uL (ref 0.0–0.5)
Eosinophils Relative: 1 %
HCT: 25.5 % — ABNORMAL LOW (ref 39.0–52.0)
Hemoglobin: 7.7 g/dL — ABNORMAL LOW (ref 13.0–17.0)
Immature Granulocytes: 14 %
Lymphocytes Relative: 8 %
Lymphs Abs: 0.6 10*3/uL — ABNORMAL LOW (ref 0.7–4.0)
MCH: 29.7 pg (ref 26.0–34.0)
MCHC: 30.2 g/dL (ref 30.0–36.0)
MCV: 98.5 fL (ref 80.0–100.0)
Monocytes Absolute: 0.3 10*3/uL (ref 0.1–1.0)
Monocytes Relative: 4 %
Neutro Abs: 6 10*3/uL (ref 1.7–7.7)
Neutrophils Relative %: 72 %
Platelets: 94 10*3/uL — ABNORMAL LOW (ref 150–400)
RBC: 2.59 MIL/uL — ABNORMAL LOW (ref 4.22–5.81)
RDW: 15.2 % (ref 11.5–15.5)
WBC: 8.1 10*3/uL (ref 4.0–10.5)
nRBC: 2.2 % — ABNORMAL HIGH (ref 0.0–0.2)

## 2019-03-17 LAB — GLUCOSE, CAPILLARY
Glucose-Capillary: 261 mg/dL — ABNORMAL HIGH (ref 70–99)
Glucose-Capillary: 215 mg/dL — ABNORMAL HIGH (ref 70–99)
Glucose-Capillary: 235 mg/dL — ABNORMAL HIGH (ref 70–99)

## 2019-03-17 LAB — POCT I-STAT 7, (LYTES, BLD GAS, ICA,H+H)
Acid-Base Excess: 3 mmol/L — ABNORMAL HIGH (ref 0.0–2.0)
Bicarbonate: 32.1 mmol/L — ABNORMAL HIGH (ref 20.0–28.0)
Calcium, Ion: 1.03 mmol/L — ABNORMAL LOW (ref 1.15–1.40)
HCT: 27 % — ABNORMAL LOW (ref 39.0–52.0)
Hemoglobin: 9.2 g/dL — ABNORMAL LOW (ref 13.0–17.0)
O2 Saturation: 88 %
Patient temperature: 98.3
Potassium: 6.3 mmol/L (ref 3.5–5.1)
Sodium: 137 mmol/L (ref 135–145)
TCO2: 35 mmol/L — ABNORMAL HIGH (ref 22–32)
pCO2 arterial: 80.2 mmHg (ref 32.0–48.0)
pH, Arterial: 7.209 — ABNORMAL LOW (ref 7.350–7.450)
pO2, Arterial: 68 mmHg — ABNORMAL LOW (ref 83.0–108.0)

## 2019-03-17 LAB — COMPREHENSIVE METABOLIC PANEL
ALT: 17 U/L (ref 0–44)
AST: 29 U/L (ref 15–41)
Albumin: 2.2 g/dL — ABNORMAL LOW (ref 3.5–5.0)
Alkaline Phosphatase: 72 U/L (ref 38–126)
Anion gap: 16 — ABNORMAL HIGH (ref 5–15)
BUN: 96 mg/dL — ABNORMAL HIGH (ref 8–23)
CO2: 28 mmol/L (ref 22–32)
Calcium: 7.3 mg/dL — ABNORMAL LOW (ref 8.9–10.3)
Chloride: 95 mmol/L — ABNORMAL LOW (ref 98–111)
Creatinine, Ser: 4.5 mg/dL — ABNORMAL HIGH (ref 0.61–1.24)
GFR calc Af Amer: 15 mL/min — ABNORMAL LOW (ref >60)
GFR calc non Af Amer: 13 mL/min — ABNORMAL LOW (ref >60)
Glucose, Bld: 230 mg/dL — ABNORMAL HIGH (ref 70–99)
Potassium: 6.1 mmol/L — ABNORMAL HIGH (ref 3.5–5.1)
Sodium: 139 mmol/L (ref 135–145)
Total Bilirubin: 0.9 mg/dL (ref 0.3–1.2)
Total Protein: 5.7 g/dL — ABNORMAL LOW (ref 6.5–8.1)

## 2019-03-17 LAB — TRIGLYCERIDES: Triglycerides: 184 mg/dL — ABNORMAL HIGH (ref <150)

## 2019-03-17 MED ORDER — GLYCOPYRROLATE 0.2 MG/ML IJ SOLN: 0.2000 mg | INTRAMUSCULAR | Status: DC | PRN

## 2019-03-17 MED ORDER — ACETAMINOPHEN 650 MG RE SUPP: 650.0000 mg | Freq: Four times a day (QID) | RECTAL | Status: DC | PRN

## 2019-03-17 MED ORDER — ACETAMINOPHEN 325 MG PO TABS: 650.0000 mg | ORAL_TABLET | Freq: Four times a day (QID) | ORAL | Status: DC | PRN

## 2019-03-17 MED ORDER — VANCOMYCIN VARIABLE DOSE PER UNSTABLE RENAL FUNCTION (PHARMACIST DOSING): Status: DC

## 2019-03-17 MED ORDER — DEXTROSE 5 % IV SOLN: INTRAVENOUS | Status: DC

## 2019-03-17 MED ORDER — SODIUM CHLORIDE 0.9 % IV SOLN: 1.0000 g | INTRAVENOUS | Status: DC

## 2019-03-17 MED ORDER — GLYCOPYRROLATE 1 MG PO TABS
1.0000 mg | ORAL_TABLET | ORAL | Status: DC | PRN
  Filled 2019-03-17: qty 1

## 2019-03-17 MED ORDER — POLYVINYL ALCOHOL 1.4 % OP SOLN
1.0000 [drp] | Freq: Four times a day (QID) | OPHTHALMIC | Status: DC | PRN
  Filled 2019-03-17: qty 15

## 2019-03-18 LAB — POCT I-STAT 7, (LYTES, BLD GAS, ICA,H+H)
Calcium, Ion: 1.07 mmol/L — ABNORMAL LOW (ref 1.15–1.40)
Calcium, Ion: 1.19 mmol/L (ref 1.15–1.40)
Calcium, Ion: 1.21 mmol/L (ref 1.15–1.40)
Calcium, Ion: 1.25 mmol/L (ref 1.15–1.40)
HCT: 28 % — ABNORMAL LOW (ref 39.0–52.0)
HCT: 28 % — ABNORMAL LOW (ref 39.0–52.0)
HCT: 29 % — ABNORMAL LOW (ref 39.0–52.0)
HCT: 30 % — ABNORMAL LOW (ref 39.0–52.0)
Hemoglobin: 10.2 g/dL — ABNORMAL LOW (ref 13.0–17.0)
Hemoglobin: 9.5 g/dL — ABNORMAL LOW (ref 13.0–17.0)
Hemoglobin: 9.5 g/dL — ABNORMAL LOW (ref 13.0–17.0)
Hemoglobin: 9.9 g/dL — ABNORMAL LOW (ref 13.0–17.0)
Patient temperature: 97.5
Patient temperature: 97.8
Patient temperature: 98
Potassium: 5.3 mmol/L — ABNORMAL HIGH (ref 3.5–5.1)
Potassium: 5.3 mmol/L — ABNORMAL HIGH (ref 3.5–5.1)
Potassium: 5.8 mmol/L — ABNORMAL HIGH (ref 3.5–5.1)
Potassium: 5.9 mmol/L — ABNORMAL HIGH (ref 3.5–5.1)
Sodium: 141 mmol/L (ref 135–145)
Sodium: 141 mmol/L (ref 135–145)
Sodium: 141 mmol/L (ref 135–145)
Sodium: 143 mmol/L (ref 135–145)
pCO2 arterial: >97 mmHg (ref 32.0–48.0)
pCO2 arterial: >97 mmHg (ref 32.0–48.0)
pCO2 arterial: >97 mmHg (ref 32.0–48.0)
pCO2 arterial: >97 mmHg (ref 32.0–48.0)
pH, Arterial: 6.996 — CL (ref 7.350–7.450)
pH, Arterial: 7.032 — CL (ref 7.350–7.450)
pH, Arterial: 7.053 — CL (ref 7.350–7.450)
pH, Arterial: 7.183 — CL (ref 7.350–7.450)
pO2, Arterial: 47 mmHg — ABNORMAL LOW (ref 83.0–108.0)
pO2, Arterial: 58 mmHg — ABNORMAL LOW (ref 83.0–108.0)
pO2, Arterial: 78 mmHg — ABNORMAL LOW (ref 83.0–108.0)
pO2, Arterial: 79 mmHg — ABNORMAL LOW (ref 83.0–108.0)

## 2019-03-18 LAB — CULTURE, RESPIRATORY W GRAM STAIN: Culture: NO GROWTH

## 2019-03-18 LAB — GLUCOSE, CAPILLARY: Glucose-Capillary: 200 mg/dL — ABNORMAL HIGH (ref 70–99)

## 2019-03-20 LAB — CULTURE, BLOOD (ROUTINE X 2)
Culture: NO GROWTH
Culture: NO GROWTH
Special Requests: ADEQUATE

## 2019-03-22 NOTE — Progress Notes (Signed)
Pt transported with RT and RN x2 on ventilator from [facility] ICU 202-3 to the comfort care room and back for a family visitation. VSS throughout. RT will continue to monitor.

## 2019-03-22 NOTE — Progress Notes (Addendum)
pts family to arrive ata 1400. Pt was bathed ,dressed in his dress shirt, and all medical cords hidden with cloths and towels. 4 handprints made and will be presented to family at visit. Valinda Party

## 2019-03-22 NOTE — Death Summary Note (Signed)
DEATH SUMMARY   Patient Details  Name: Alexander Erickson MRN: [id number] DOB: [birth year]  Admission/Discharge Information   Admit Date:  02-26-2019  Date of Death:    Time of Death:    Length of Stay: 08-15-2022  Referring Physician: Patient, No Pcp Per   Reason(s) for Hospitalization  dyspnea  Diagnoses  Preliminary cause of death:   COVID 2022-08-11 Secondary Diagnoses (including complications and co-morbidities):  Principal Problem:   Pneumonia due to COVID-19 virus Active Problems:   Acute respiratory failure with hypoxia (HCC)   Hyponatremia   Prolonged QT interval   Encounter for intubation   Brief Hospital Course (including significant findings, care, treatment, and services provided and events leading to death)  64 y./o male admited with acute respiratory failure with hypoxemia due to COVID pneumonia, treated with heated high flow for days; developed hemoptysis and worsening respiratory failure requiring intubation on 12/18. 11/30>>COVID test + 12/4>>admit to [facility] via [facility] ED 12/9>>transferred to ICU for heated high flow 12/11>>transferred back to PCU-heated high flow continued  12/17>>suspected to have superimposed bacterial pneumonia on top of COVID-19  12/19>> nasal packing for ongoing nose bleed, bilateral chest tubes placed for pneumothoraces and pneumomediastinum 12/24 replaced right nare packing 12/25 replaced left nare packing  On by 12/26 his renal function was worsening and he still required high ventilator settings.  We discussed his multi-organ failure with his family at length and his overall poor prognosis.  They elected to withdraw care.    Pertinent Labs and Studies  Significant Diagnostic Studies DG Chest 1 View  Result Date: 03/13/2019 CLINICAL DATA:  Pneumothorax. EXAM: CHEST  1 VIEW COMPARISON:  Chest x-ray dated 03/12/2019. Chest CT dated 03/09/2019. FINDINGS: Endotracheal tube remains well positioned with tip above the level of the carina.  Enteric tube passes below the diaphragm. LEFT IJ central line appears well positioned with tip at the level of the mid/lower SVC. Heart size and mediastinal contours appear stable. Diffuse bilateral interstitial and alveolar airspace opacities are stable compared to yesterday's chest x-ray. Probable small LEFT pleural effusion. Bilateral chest tubes in place, stable in position compared to yesterday's chest x-ray. No pneumothorax is appreciated. IMPRESSION: 1. No interval change compared to yesterday's chest x-ray. No pneumothorax seen. 2. Stable bilateral chest tubes. 3. Stable bilateral interstitial and alveolar airspace opacities, some component related to pneumonia given patient's history of COVID-19 positivity, perhaps with superimposed pulmonary edema. Electronically Signed   By: Bary Richard M.D.   On: 03/13/2019 08:50   DG Chest 1 View  Result Date: 03/12/2019 CLINICAL DATA:  Hypoxia EXAM: CHEST  1 VIEW COMPARISON:  March 11, 2019 FINDINGS: Endotracheal tube tip is 3.8 cm above the carina. Nasogastric tube tip and side port are below the diaphragm. Central catheter tip is at the cavoatrial junction. Chest tubes are present on each side, unchanged in position. No pneumothorax. There is airspace opacity bilaterally, essentially stable compared to 1 day prior. No new opacity evident. Heart size and pulmonary vascular normal. No adenopathy. No bone lesions. IMPRESSION: Tube and catheter positions as described without evident pneumothorax. Extensive airspace opacity bilaterally is essentially stable compared to 1 day prior allowing for slight differences in technique. Stable cardiac silhouette. Electronically Signed   By: Bretta Bang III M.D.   On: 03/12/2019 07:00   DG Chest 1 View  Result Date: 03/11/2019 CLINICAL DATA:  Intubation EXAM: CHEST  1 VIEW COMPARISON:  Yesterday FINDINGS: Endotracheal tube tip is at the clavicular heads. The left IJ line tip  is at the upper cavoatrial junction.  Bilateral chest tubes in similar position when allowing for differences in patient rotation. Extensive bilateral airspace disease. No visible pneumothorax. Stable low lung volumes. Normal heart size IMPRESSION: 1. Stable hardware positioning. 2. Stable low volume chest with severe airspace disease. No visible pneumothorax. Electronically Signed   By: Marnee Spring M.D.   On: 03/11/2019 04:46   DG Chest 1 View  Result Date: 03/09/2019 CLINICAL DATA:  Evaluate chest tubes. EXAM: CHEST  1 VIEW COMPARISON:  March 09, 2019 FINDINGS: The ETT is in good position. The right chest tube is stable. The right-sided pneumothorax is a little smaller in the interval measuring 2.5 cm on this study versus 3 point 4th cm at the apex on the previous study. The left-sided pneumothorax measures 1.3 cm on this study at the apex versus 1.6 cm previously. A new left-sided chest tube is been placed with the distal tip projected over the left heart border. Pulmonary infiltrates remain. The cardiomediastinal silhouette is stable. No pneumomediastinum. IMPRESSION: 1. Bilateral chest tubes and pneumothoraces as above. Both pneumothoraces are a little smaller in the interval. 2. Other support apparatus as above. 3. Known pneumomediastinum. 4. Subcutaneous emphysema in the base of the neck bilaterally, increased on the left and similar on the right in the interval. Electronically Signed   By: Gerome Sam III M.D   On: 03/09/2019 19:09   DG Chest 1 View  Result Date: 03/09/2019 CLINICAL DATA:  COVID-19 virus infection. Acute respiratory failure. Endotracheally intubated. Bilateral pneumothoraces. Chest tube placement. EXAM: CHEST  1 VIEW COMPARISON:  03/09/2019 FINDINGS: A new right pleural catheter is seen in place. A 20 25% right pneumothorax shows no significant change in size. A smaller approximately 15% left pneumothorax is also stable. Endotracheal tube, left jugular central venous catheter, and nasogastric tube remain in  appropriate position. Heart size is normal. Mild diffuse bilateral airspace opacity shows no significant change. IMPRESSION: No significant change in 20-25% right pneumothorax and smaller approximately 15% left pneumothorax. Right pleural catheter appears to be in appropriate position. Stable bilateral airspace disease. Electronically Signed   By: Danae Orleans M.D.   On: 03/09/2019 17:02   DG Chest 1 View  Addendum Date: 03/09/2019   ADDENDUM REPORT: 03/09/2019 15:43 ADDENDUM: The findings were called to Dr. Myrla Halsted. Electronically Signed   By: Gerome Sam III M.D   On: 03/09/2019 15:43   Result Date: 03/09/2019 CLINICAL DATA:  Evaluate central line placement. EXAM: CHEST  1 VIEW COMPARISON:  March 08, 2019 FINDINGS: An ETT is in good position. The new left central line terminates just to the right side of the atrium of, 2 cm below the caval atrial junction. An NG tube terminates in the stomach. Diffuse bilateral pulmonary infiltrates are identified, more prominent in the interval. There is a right-sided pneumothorax measuring up to 3 cm at the apex. There is increased right chest wall subcutaneous air, not seen previously. A small left apical pneumothorax is not excluded but this finding is not definitive on the left. IMPRESSION: 1. New 3 cm right apical pneumothorax. New subcutaneous emphysema in the right chest wall and base of neck. 2. Findings suggest a small left apical pneumothorax although this is not definitive. 3. Worsening bilateral pulmonary infiltrates. 4. The new left central line terminates 2 cm into the right atrium of the support apparatus as above Findings will be called physician by myself. Electronically Signed: By: Gerome Sam III M.D On: 03/09/2019 15:36  DG Abd 1 View  Result Date: 03/08/2019 CLINICAL DATA:  NG tube EXAM: ABDOMEN - 1 VIEW COMPARISON:  None. FINDINGS: NG tube tip is in the stomach.  Nonobstructive bowel gas pattern. IMPRESSION: NG tube in the stomach.  Electronically Signed   By: Charlett Nose M.D.   On: 03/08/2019 19:57   CT CHEST WO CONTRAST  Result Date: 03/09/2019 CLINICAL DATA:  64 year old male with acute respiratory failure, hypoxemia COVID pneumonia. Pneumothorax/pneumomediastinum. EXAM: CT CHEST WITHOUT CONTRAST TECHNIQUE: Multidetector CT imaging of the chest was performed following the standard protocol without IV contrast. COMPARISON:  03/09/2019 chest radiograph and 02/27/2019 chest CT FINDINGS: Cardiovascular: Heart size normal. No thoracic aortic aneurysm or pericardial effusion. A LEFT IJ central venous catheter is noted with tip at the SUPERIOR cavoatrial junction. Mediastinum/Nodes: Moderate to large amount of pneumomediastinum identified with subcutaneous emphysema/pneumomediastinum extending into the LOWER neck. Endotracheal tube is identified with tip 3 cm above the carina. An NG tube is noted with tip in mid stomach. No abnormal lymph nodes. Lungs/Pleura: Small to moderate bilateral pneumothoraces are noted, approximately 15%. A RIGHT thoracostomy tube is noted. Diffuse ground-glass and airspace opacities within both lungs again noted with more consolidated appearance in the dependent lungs. No pleural effusions are identified. Upper Abdomen: No acute abnormality. Musculoskeletal: No acute or suspicious bony abnormalities identified. IMPRESSION: 1. Small to moderate bilateral pneumothoraces-approximately 15%. RIGHT thoracostomy tube in place. 2. Moderate to large amount of pneumomediastinum with subcutaneous emphysema in the UPPER chest and LOWER neck. 3. Diffuse ground-glass and airspace opacities within both lungs again noted compatible with known infection. These results will be called to the ordering clinician or representative by the Radiologist Assistant, and communication documented in the PACS or zVision Dashboard. Electronically Signed   By: Harmon Pier M.D.   On: 03/09/2019 18:33   CT CHEST W CONTRAST  Result Date:  02/28/2019 CLINICAL DATA:  Cardiac thrombus or embolic source suspected. Concern for thrombus within the proximal IVC seen on ECHO. EXAM: CT CHEST, ABDOMEN, AND PELVIS WITH CONTRAST TECHNIQUE: Multidetector CT imaging of the chest, abdomen and pelvis was performed following the standard protocol during bolus administration of intravenous contrast. CONTRAST:  OMNIPAQUE IOHEXOL 350 MG/ML SOLN COMPARISON:  None. FINDINGS: CT CHEST FINDINGS Cardiovascular: There is no large centrally located pulmonary embolism. The heart size is normal. There is no significant pericardial effusion. Coronary artery calcifications are noted. Minimal atherosclerotic changes are noted of the thoracic aorta without evidence for a thoracic aortic aneurysm. Mediastinum/Nodes: --there are prominent mediastinal and hilar lymph nodes, likely reactive. --No axillary lymphadenopathy. --No supraclavicular lymphadenopathy. --Normal thyroid gland. --The esophagus is unremarkable Lungs/Pleura: There are diffuse bilateral ground-glass airspace opacities involving all lobes to relatively equal degree. There is no pneumothorax. No significant pleural effusion. Musculoskeletal: No chest wall abnormality. No acute or significant osseous findings. Review of the MIP images confirms the above findings. CT ABDOMEN PELVIS FINDINGS Hepatobiliary: The liver is normal. Normal gallbladder.There is no biliary ductal dilation. Pancreas: Normal contours without ductal dilatation. No peripancreatic fluid collection. Spleen: No splenic laceration or hematoma. Adrenals/Urinary Tract: --Adrenal glands: No adrenal hemorrhage. --Right kidney/ureter: No hydronephrosis or perinephric hematoma. --Left kidney/ureter: No hydronephrosis or perinephric hematoma. --Urinary bladder: Unremarkable. Stomach/Bowel: --Stomach/Duodenum: No hiatal hernia or other gastric abnormality. Normal duodenal course and caliber. --Small bowel: No dilatation or inflammation. --Colon: No focal  abnormality. --Appendix: Normal. Vascular/Lymphatic: Normal course and caliber of the major abdominal vessels. There is no evidence for a thrombus within the IVC. --No retroperitoneal lymphadenopathy. --  No mesenteric lymphadenopathy. --No pelvic or inguinal lymphadenopathy. Reproductive: Unremarkable Other: No ascites or free air. 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 Musculoskeletal. No acute displaced fractures. IMPRESSION: 1. No CT evidence for IVC thrombus. 2. Diffuse bilateral ground-glass airspace opacities consistent with the patient's history of viral pneumonia. 3. Prominent mediastinal and hilar lymph nodes, likely reactive. Aortic Atherosclerosis (ICD10-I70.0). Electronically Signed   By: Katherine Mantle M.D.   On: 02/28/2019 00:45   DG Chest Port 1 View  Result Date: 2019/04/07 CLINICAL DATA:  Acute respiratory distress, COVID-19 EXAM: PORTABLE CHEST 1 VIEW COMPARISON:  Radiograph 03/16/2019 FINDINGS: Endotracheal tube is somewhat low within the trachea, terminating 2 cm from the carina. A transesophageal tube tip and side port distal to the GE junction. Bilateral pigtail pleural drains are noted. A left IJ approach catheter tip terminates at the level of the right atrium. There are diffuse interstitial and airspace opacities throughout both lungs somewhat increasing in the right lung base though this may be related to diminished lung volumes. Cardiomediastinal silhouette is similar to prior. No acute osseous or soft tissue abnormality. IMPRESSION: 1. Endotracheal tube is somewhat low within the trachea, terminating 2 cm from the carina. Could consider retraction 1 cm to the mid trachea. 2. Transesophageal tip and side port distal to the GE junction. 3. Bilateral pigtail pleural drains in place. 4. Left IJ catheter terminating in the right atrium. 5. Diffuse interstitial and airspace opacities throughout both lungs, somewhat increasing in the right lung base though possibly related  to a diminishing lung volume. Electronically Signed   By: Kreg Shropshire M.D.   On: 04/07/19 03:53   DG Chest Port 1 View  Result Date: 03/16/2019 CLINICAL DATA:  Acute respiratory distress syndrome.  COVID-19. EXAM: PORTABLE CHEST 1 VIEW COMPARISON:  03/15/2019 FINDINGS: Endotracheal tube has tip 3 cm above the carina. Enteric tube courses into the region of the stomach and off the film as tip is not visualized. Left IJ central venous catheter has tip over the SVC unchanged. Right pleural pigtail drainage catheter unchanged. Patient is slightly rotated to the right. Lungs are hypoinflated demonstrate stable diffuse bilateral mixed hazy airspace process. No effusion or pneumothorax. Cardiomediastinal silhouette and remainder of the exam is unchanged. IMPRESSION: Stable bilateral hazy airspace process likely multifocal infection. Tubes and lines as described. Electronically Signed   By: Elberta Fortis M.D.   On: 03/16/2019 07:23   DG CHEST PORT 1 VIEW  Result Date: 03/15/2019 CLINICAL DATA:  Ventilator dependent respiratory failure. Recent BILATERAL pneumothoraces treated with small bore pigtail chest tubes. Follow-up COVID-19 pneumonia and/or ARDS. EXAM: PORTABLE CHEST 1 VIEW COMPARISON:  03/14/2019 and earlier. FINDINGS: Endotracheal tube tip in satisfactory position projecting approximately 5-6 cm above the carina. Nasogastric tube courses below the diaphragm into the stomach. LEFT jugular central venous catheter tip at or near the cavoatrial junction, unchanged. BILATERAL pigtail chest tubes are in place with no evidence of pneumothorax. Interval worsening of the interstitial and airspace opacities throughout both lungs, LEFT greater than RIGHT, since yesterday's examination. IMPRESSION: 1. Support apparatus satisfactory. 2. Worsening pneumonia and/or ARDS since yesterday's examination. 3. No evidence of pneumothorax with BILATERAL chest tubes in place. Electronically Signed   By: Hulan Saas M.D.    On: 03/15/2019 13:45   DG Chest Port 1 View  Result Date: 03/15/2019 CLINICAL DATA:  ETT decreased O2 sat EXAM: PORTABLE CHEST 1 VIEW COMPARISON:  March 14, 2019 FINDINGS: The heart size and mediastinal contours are within normal limits. ET  tube is 3 cm above the carina. Bilateral pigtail catheters are seen. No definite pneumothorax. NG tube is seen coursing below the diaphragm. Patchy airspace opacities seen throughout both lungs, with slight interval improvement in aeration. No new airspace consolidation. IMPRESSION: Slight interval improvement in the patchy/hazy airspace opacities throughout both lungs. Electronically Signed   By: Jonna Clark M.D.   On: 03/15/2019 00:39   DG Chest Port 1 View  Result Date: 03/14/2019 CLINICAL DATA:  Ventilator dependent respiratory failure. Follow-up COVID-19 pneumonia/ARDS and BILATERAL pneumothoraces. EXAM: PORTABLE CHEST 1 VIEW COMPARISON:  03/13/2019 and earlier. FINDINGS: Endotracheal tube tip approximately 6 cm above the carina. BILATERAL pigtail chest tubes in place with no visible pneumothorax on either side. LEFT jugular central venous catheter tip at or near the cavoatrial junction, unchanged. Nasogastric tube courses below the diaphragm into the stomach. Patchy ground-glass airspace opacities throughout both lungs, stable over the past 4-5 days. New more focally dense airspace consolidation in the MEDIAL RIGHT lung base. No new abnormalities elsewhere. IMPRESSION: 1. Stable pneumonia/ARDS throughout both lungs over the past 4-5 days. 2. New more focally dense atelectasis and/or pneumonia in the MEDIAL RIGHT lung base. 3. No pneumothorax on either side with pigtail chest tubes in place. 4. Support apparatus satisfactory. Electronically Signed   By: Hulan Saas M.D.   On: 03/14/2019 10:30   DG CHEST PORT 1 VIEW  Result Date: 03/11/2019 CLINICAL DATA:  Shortness of breath. EXAM: PORTABLE CHEST 1 VIEW COMPARISON:  03/11/2019. FINDINGS: Right chest  tube has been partially withdrawn, its tip is in the lateral most portion of the right mid chest. Left chest tube in stable position. No pneumothorax. Endotracheal tube, NG tube, left IJ line stable position. Heart size normal. Diffuse severe bilateral pulmonary infiltrates again noted. IMPRESSION: 1. Right chest tube has been partially withdrawn, its tip is in the lateral most portion of the right mid chest. Left chest tube is in stable position. No pneumothorax. 2.  Remaining lines and tubes in stable position. 3.  Diffuse severe bilateral pulmonary infiltrates are again noted These results will be called to the ordering clinician or representative by the Radiologist Assistant, and communication documented in the PACS or zVision Dashboard. Electronically Signed   By: Maisie Fus  Register   On: 03/11/2019 16:04   DG CHEST PORT 1 VIEW  Result Date: 03/10/2019 CLINICAL DATA:  Pneumothorax. EXAM: PORTABLE CHEST 1 VIEW COMPARISON:  Radiographs and CT yesterday. FINDINGS: Endotracheal tube tip just above the clavicular heads. Tip and side-port of the enteric tube below the diaphragm in the stomach. Left internal jugular central venous catheter tip in the lower SVC. Bilateral pigtail catheters in place. Improved bilateral pneumothoraces. Right pneumothorax is no longer visualized. Left pneumothorax has improved. Heterogeneous airspace opacities throughout both lungs are not significantly changed. Heart is unchanged in size. Subcutaneous emphysema in the supraclavicular soft tissues is improved IMPRESSION: 1. Improved bilateral pneumothoraces. No visualized pneumothorax on the right, small residual at the left lung apex. Bilateral chest tubes in place. 2. Unchanged bilateral heterogeneous opacities in both lungs. 3. Support apparatus unchanged. Electronically Signed   By: Narda Rutherford M.D.   On: 03/10/2019 04:53   DG CHEST PORT 1 VIEW  Result Date: 03/09/2019 CLINICAL DATA:  Shortness of breath EXAM: PORTABLE  CHEST 1 VIEW COMPARISON:  March 09, 2011 FINDINGS: The endotracheal tube terminates above the carina. The left-sided central venous catheter is stable. Bilateral chest tubes are noted. There are small bilateral pneumothoraces, improved on the right and stable  on the left. The enteric tube extends below the left hemidiaphragm. Pneumomediastinum is again noted. Extensive bilateral hazy airspace opacities are similar to prior study. IMPRESSION: 1. Small bilateral pneumothoraces, improved on the right and stable on the left. 2. Stable pneumomediastinum. 3. Stable extensive bilateral hazy airspace opacities. 4. Lines and tubes as above. Electronically Signed   By: Constance Holster M.D.   On: 03/09/2019 23:00   DG CHEST PORT 1 VIEW  Result Date: 03/08/2019 CLINICAL DATA:  Shortness of breath. EXAM: PORTABLE CHEST 1 VIEW COMPARISON:  03/08/2019 FINDINGS: An endotracheal tube is identified with tip 3 cm above the carina. NG tube is noted with tip overlying the proximal-mid stomach. Bilateral interstitial/airspace opacities are again noted without significant change. No definite pleural effusion or pneumothorax. IMPRESSION: Endotracheal tube placement with tip 3 cm above the carina and NG tube placed with tip overlying the proximal-mid stomach. Unchanged bilateral interstitial/airspace opacities. Electronically Signed   By: Margarette Canada M.D.   On: 03/08/2019 19:47   DG Chest Port AM  Result Date: 03/07/2019 CLINICAL DATA:  COVID infection EXAM: PORTABLE CHEST 1 VIEW COMPARISON:  03/02/2019; 02/25/2019; chest CT-02/27/2019 FINDINGS: Grossly unchanged cardiac silhouette and mediastinal contours given persistently reduced lung volumes. There is persistent thickening the right paratracheal stripe presumably secondary prominent vasculature. Worsening diffuse bilateral interstitial opacities and consolidative opacities involving the left mid and lower lung with suspected retrocardiac air bronchograms. No definite  pleural effusion. No definite evidence of edema. No acute osseous abnormalities. IMPRESSION: Worsening bilateral interstitial opacities and consolidative opacities involving the left mid and lower lung, nonspecific though worrisome for progression of atypical infection, though note, underlying edema not excluded. Electronically Signed   By: Sandi Mariscal M.D.   On: 03/07/2019 08:19   DG Chest Port 1 View  Result Date: 02/25/2019 CLINICAL DATA:  64 year old male COVID-19 pneumonia. EXAM: PORTABLE CHEST 1 VIEW COMPARISON:  2019-03-11 and earlier. FINDINGS: Portable AP semi upright view at 0648 hours. Lower lung volumes. Stable cardiac size and mediastinal contours. Visualized tracheal air column is within normal limits. Coarse and interstitial bilateral pulmonary opacity. Some improved ventilation at the right lung base. Most confluent opacity now remains at the left lung base. No superimposed pneumothorax or effusion. Paucity of bowel gas in the upper abdomen. No acute osseous abnormality identified. IMPRESSION: 1. Continued bilateral pneumonia with improved ventilation at the right lung base since 2019/03/11. 2. No new cardiopulmonary abnormality. Electronically Signed   By: Genevie Ann M.D.   On: 02/25/2019 07:57   DG Chest Portable 1 View  Result Date: 11-Mar-2019 CLINICAL DATA:  Shortness of breath, COVID positive EXAM: PORTABLE CHEST 1 VIEW COMPARISON:  None. FINDINGS: Cardiomediastinal contours are normal. There is diffuse interstitial and airspace opacity throughout both the right and left chest. No signs of dense consolidation or evidence of pleural effusion. No acute bone finding. IMPRESSION: Diffuse interstitial and airspace opacity throughout both lungs. Findings which could be seen in viral or atypical pneumonia, also with COVID-19 infection. Asymmetric pulmonary edema could have similar appearance. Electronically Signed   By: Zetta Bills M.D.   On: 2019/03/11 13:01   DG Chest Port 1V  today  Result Date: 03/08/2019 CLINICAL DATA:  Shortness of breath. COVID 19 pneumonia. EXAM: PORTABLE CHEST 1 VIEW COMPARISON:  Radiographs 03/07/2019 and 03/02/2019. FINDINGS: 1436 hours. The heart size and mediastinal contours are stable. There are persistent low lung volumes with diffuse bilateral interstitial pulmonary opacities. The retrocardiac consolidation in the left lower lobe has mildly improved. No  pneumothorax or significant pleural effusion. The bones appear unchanged. Telemetry leads overlie the chest. IMPRESSION: 1. Mildly improved left lower lobe consolidation. 2. Persistent diffuse interstitial pulmonary opacities consistent with viral pneumonia. Electronically Signed   By: Carey Bullocks M.D.   On: 03/08/2019 14:45   DG Chest Port 1V today  Result Date: 03/02/2019 CLINICAL DATA:  Shortness of breath. Follow-up for COVID-19 pneumonia. EXAM: PORTABLE CHEST 1 VIEW COMPARISON:  02/25/2019 and earlier studies. FINDINGS: Hazy bilateral airspace lung opacities are without significant change from the most recent prior exam. No new lung abnormalities. No evidence of a pleural effusion and no pneumothorax. IMPRESSION: 1. No significant change from the most recent prior study. Persistent bilateral hazy airspace lung opacities consistent with COVID-19 pneumonia, with possible superimposed ARDS. Electronically Signed   By: Amie Portland M.D.   On: 03/02/2019 13:12   ECHOCARDIOGRAM COMPLETE  Result Date: 02/27/2019   ECHOCARDIOGRAM REPORT   Patient Name:   Alexander Erickson Date of Exam: 02/27/2019 Medical Rec #:  [id number]    Height:       65.5 in Accession #:    [id number]   Weight:       187.4 lb Date of Birth:  06-29-1955   BSA:          1.94 m Patient Age:    63 years     BP:           109/71 mmHg Patient Gender: M            HR:           102 bpm. Exam Location:  Inpatient Procedure: 2D Echo, Cardiac Doppler and Color Doppler Indications:    Acute respiratory failure.  History:        Patient has  no prior history of Echocardiogram examinations.                 Abnormal ECG. Covid 19 positive. Hypoxia. Pneumonia.  Sonographer:    Sheralyn Boatman RDCS Referring Phys: [id number] [facility]  Sonographer Comments: Technically difficult study due to poor echo windows. IMPRESSIONS  1. There is a mass like structure present in the IVC. This prevents collpase of the IVC during the examination. Due to the patient's history of covid-19 infection and LE DVT, this likely represents a thrombus. RV size and function are normal.  2. Left ventricular ejection fraction, by visual estimation, is >75%. The left ventricle has hyperdynamic function. There is mildly increased left ventricular hypertrophy.  3. Small left ventricular internal cavity size.  4. The left ventricle has no regional wall motion abnormalities.  5. Global right ventricle has normal systolic function.The right ventricular size is normal. No increase in right ventricular wall thickness.  6. Left atrial size was normal.  7. Right atrial size was normal.  8. Presence of pericardial fat pad.  9. The pericardial effusion is circumferential. 10. Trivial pericardial effusion is present. 11. The mitral valve is grossly normal. No evidence of mitral valve regurgitation. 12. The tricuspid valve is grossly normal. Tricuspid valve regurgitation is trivial. 13. The aortic valve is grossly normal. Aortic valve regurgitation is not visualized. No evidence of aortic valve sclerosis or stenosis. 14. The pulmonic valve was grossly normal. Pulmonic valve regurgitation is not visualized. 15. TR signal is inadequate for assessing pulmonary artery systolic pressure. 16. 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 17. The inferior vena cava is normal in size with <50% respiratory variability, suggesting right atrial pressure of 8 mmHg. FINDINGS  Left Ventricle: Left ventricular ejection fraction, by visual estimation, is >75%. The left ventricle has hyperdynamic function. The left ventricle  has no regional wall motion abnormalities. The left ventricular internal cavity size was the LV cavity size is small. There is mildly increased left ventricular hypertrophy. Concentric left ventricular hypertrophy. Left ventricular diastolic parameters are consistent with age-related delayed relaxation (normal). Right Ventricle: The right ventricular size is normal. No increase in right ventricular wall thickness. Global RV systolic function is has normal systolic function. Left Atrium: Left atrial size was normal in size. Right Atrium: Right atrial size was normal in size Pericardium: Trivial pericardial effusion is present. The pericardial effusion is circumferential. Presence of pericardial fat pad. Mitral Valve: The mitral valve is grossly normal. No evidence of mitral valve regurgitation. Tricuspid Valve: The tricuspid valve is grossly normal. Tricuspid valve regurgitation is trivial. Aortic Valve: The aortic valve is grossly normal. Aortic valve regurgitation is not visualized. The aortic valve is structurally normal, with no evidence of sclerosis or stenosis. Pulmonic Valve: The pulmonic valve was grossly normal. Pulmonic valve regurgitation is not visualized. Pulmonic regurgitation is not visualized. Aorta: The aortic root is normal in size and structure. Venous: The inferior vena cava is normal in size with less than 50% respiratory variability, suggesting right atrial pressure of 8 mmHg. There is mass detected in the inferior vena cava. IAS/Shunts: No atrial level shunt detected by color flow Doppler.  LEFT VENTRICLE PLAX 2D LVIDd:         2.67 cm       Diastology LVIDs:         1.94 cm       LV e' lateral:   7.29 cm/s LV PW:         1.58 cm       LV E/e' lateral: 7.6 LV IVS:        1.20 cm       LV e' medial:    5.22 cm/s LVOT diam:     1.60 cm       LV E/e' medial:  10.6 LV SV:         15 ml LV SV Index:   7.22 LVOT Area:     2.01 cm  LV Volumes (MOD) LV area d, A2C:    12.70 cm LV area d, A4C:     17.80 cm LV area s, A2C:    8.18 cm LV area s, A4C:    9.86 cm LV major d, A2C:   4.92 cm LV major d, A4C:   6.84 cm LV major s, A2C:   4.51 cm LV major s, A4C:   5.77 cm LV vol d, MOD A2C: 27.7 ml LV vol d, MOD A4C: 40.2 ml LV vol s, MOD A2C: 12.9 ml LV vol s, MOD A4C: 14.3 ml LV SV MOD A2C:     14.8 ml LV SV MOD A4C:     40.2 ml LV SV MOD BP:      24.0 ml RIGHT VENTRICLE             IVC RV S prime:     22.40 cm/s  IVC diam: 1.53 cm TAPSE (M-mode): 1.7 cm LEFT ATRIUM             Index       RIGHT ATRIUM           Index LA diam:        2.40 cm 1.24 cm/m  RA Area:  13.30 cm LA Vol (A2C):   22.5 ml 11.63 ml/m RA Volume:   34.80 ml  17.98 ml/m LA Vol (A4C):   27.4 ml 14.16 ml/m LA Biplane Vol: 26.4 ml 13.64 ml/m  AORTIC VALVE LVOT Vmax:   133.00 cm/s LVOT Vmean:  105.000 cm/s LVOT VTI:    0.211 m  AORTA Ao Root diam: 2.90 cm MITRAL VALVE MV Area (PHT): 2.39 cm             SHUNTS MV PHT:        91.93 msec           Systemic VTI:  0.21 m MV Decel Time: 317 msec             Systemic Diam: 1.60 cm MV E velocity: 55.30 cm/s 103 cm/s MV A velocity: 89.20 cm/s 70.3 cm/s MV E/A ratio:  0.62       1.5  Lennie Odor MD Electronically signed by Lennie Odor MD Signature Date/Time: 02/27/2019/5:55:17 PM    Final    CT VENOGRAM ABD/PEL  Result Date: 02/28/2019 CLINICAL DATA:  Cardiac thrombus or embolic source suspected. Concern for thrombus within the proximal IVC seen on ECHO. EXAM: CT CHEST, ABDOMEN, AND PELVIS WITH CONTRAST TECHNIQUE: Multidetector CT imaging of the chest, abdomen and pelvis was performed following the standard protocol during bolus administration of intravenous contrast. CONTRAST:  OMNIPAQUE IOHEXOL 350 MG/ML SOLN COMPARISON:  None. FINDINGS: CT CHEST FINDINGS Cardiovascular: There is no large centrally located pulmonary embolism. The heart size is normal. There is no significant pericardial effusion. Coronary artery calcifications are noted. Minimal atherosclerotic changes are noted  of the thoracic aorta without evidence for a thoracic aortic aneurysm. Mediastinum/Nodes: --there are prominent mediastinal and hilar lymph nodes, likely reactive. --No axillary lymphadenopathy. --No supraclavicular lymphadenopathy. --Normal thyroid gland. --The esophagus is unremarkable Lungs/Pleura: There are diffuse bilateral ground-glass airspace opacities involving all lobes to relatively equal degree. There is no pneumothorax. No significant pleural effusion. Musculoskeletal: No chest wall abnormality. No acute or significant osseous findings. Review of the MIP images confirms the above findings. CT ABDOMEN PELVIS FINDINGS Hepatobiliary: The liver is normal. Normal gallbladder.There is no biliary ductal dilation. Pancreas: Normal contours without ductal dilatation. No peripancreatic fluid collection. Spleen: No splenic laceration or hematoma. Adrenals/Urinary Tract: --Adrenal glands: No adrenal hemorrhage. --Right kidney/ureter: No hydronephrosis or perinephric hematoma. --Left kidney/ureter: No hydronephrosis or perinephric hematoma. --Urinary bladder: Unremarkable. Stomach/Bowel: --Stomach/Duodenum: No hiatal hernia or other gastric abnormality. Normal duodenal course and caliber. --Small bowel: No dilatation or inflammation. --Colon: No focal abnormality. --Appendix: Normal. Vascular/Lymphatic: Normal course and caliber of the major abdominal vessels. There is no evidence for a thrombus within the IVC. --No retroperitoneal lymphadenopathy. --No mesenteric lymphadenopathy. --No pelvic or inguinal lymphadenopathy. Reproductive: Unremarkable Other: No ascites or free air. 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 Musculoskeletal. No acute displaced fractures. IMPRESSION: 1. No CT evidence for IVC thrombus. 2. Diffuse bilateral ground-glass airspace opacities consistent with the patient's history of viral pneumonia. 3. Prominent mediastinal and hilar lymph nodes, likely reactive. Aortic  Atherosclerosis (ICD10-I70.0). Electronically Signed   By: Katherine Mantle M.D.   On: 02/28/2019 00:45   VAS [place] LOWER EXTREMITY VENOUS (DVT)  Result Date: 02/24/2019  Lower Venous Study Indications: Elevated Ddimer.  Risk Factors: COVID 19 positive. Comparison Study: No prior studies. Performing Technologist: Chanda Busing RVT  Examination Guidelines: A complete evaluation includes B-mode imaging, spectral Doppler, color Doppler, and power Doppler as needed of all accessible portions of each vessel.  Bilateral testing is considered an integral part of a complete examination. Limited examinations for reoccurring indications may be performed as noted.  +---------+---------------+---------+-----------+----------+--------------+ RIGHT    CompressibilityPhasicitySpontaneityPropertiesThrombus Aging +---------+---------------+---------+-----------+----------+--------------+ CFV      Full           Yes      Yes                                 +---------+---------------+---------+-----------+----------+--------------+ SFJ      Full                                                        +---------+---------------+---------+-----------+----------+--------------+ FV Prox  Full                                                        +---------+---------------+---------+-----------+----------+--------------+ FV Mid   Full                                                        +---------+---------------+---------+-----------+----------+--------------+ FV DistalFull                                                        +---------+---------------+---------+-----------+----------+--------------+ PFV      Full                                                        +---------+---------------+---------+-----------+----------+--------------+ POP      Full           Yes      Yes                                  +---------+---------------+---------+-----------+----------+--------------+ PTV      Partial                                      Acute          +---------+---------------+---------+-----------+----------+--------------+ PERO     Full                                                        +---------+---------------+---------+-----------+----------+--------------+   +---------+---------------+---------+-----------+----------+--------------+ LEFT     CompressibilityPhasicitySpontaneityPropertiesThrombus Aging +---------+---------------+---------+-----------+----------+--------------+ CFV      Full           Yes  Yes                                 +---------+---------------+---------+-----------+----------+--------------+ SFJ      Full                                                        +---------+---------------+---------+-----------+----------+--------------+ FV Prox  Full                                                        +---------+---------------+---------+-----------+----------+--------------+ FV Mid   Full                                                        +---------+---------------+---------+-----------+----------+--------------+ FV DistalFull                                                        +---------+---------------+---------+-----------+----------+--------------+ PFV      Full                                                        +---------+---------------+---------+-----------+----------+--------------+ POP      Full           Yes      Yes                                 +---------+---------------+---------+-----------+----------+--------------+ PTV      Full                                                        +---------+---------------+---------+-----------+----------+--------------+ PERO     Full                                                         +---------+---------------+---------+-----------+----------+--------------+     Summary: Right: Findings consistent with acute deep vein thrombosis involving the right posterior tibial veins. No cystic structure found in the popliteal fossa. Left: There is no evidence of deep vein thrombosis in the lower extremity. No cystic structure found in the popliteal fossa.  *See table(s) above for measurements and observations. Electronically signed by Waverly Ferrari MD on 02/24/2019 at 3:55:02 PM.    Final     Microbiology Recent Results (  from the past 240 hour(s))  Culture, blood (routine x 2)     Status: None (Preliminary result)   Collection Time: 03/15/19  2:28 PM   Specimen: BLOOD  Result Value Ref Range Status   Specimen Description BLOOD RIGHT ANTECUBITAL  Final   Special Requests   Final    BOTTLES DRAWN AEROBIC ONLY Blood Culture adequate volume   Culture   Final    NO GROWTH 2 DAYS Performed at [facility], [address] [place], [place] [address]    Report Status PENDING  Incomplete  Culture, blood (routine x 2)     Status: None (Preliminary result)   Collection Time: 03/15/19  2:31 PM   Specimen: BLOOD LEFT HAND  Result Value Ref Range Status   Specimen Description BLOOD LEFT HAND  Final   Special Requests   Final    BOTTLES DRAWN AEROBIC ONLY Blood Culture results may not be optimal due to an inadequate volume of blood received in culture bottles   Culture   Final    NO GROWTH 2 DAYS Performed at [facility], [address] [place], [place] [address]    Report Status PENDING  Incomplete  Culture, respiratory (non-expectorated)     Status: None (Preliminary result)   Collection Time: 03/15/19  5:52 PM   Specimen: Tracheal Aspirate; Respiratory  Result Value Ref Range Status   Specimen Description   Final    TRACHEAL ASPIRATE Performed at [facility], [address] [place], [place] [address]    Special Requests   Final     NONE Performed at [facility], [address] [place], [place] [address]    Gram Stain   Final    ABUNDANT WBC PRESENT,BOTH PMN AND MONONUCLEAR NO ORGANISMS SEEN    Culture   Final    NO GROWTH 2 DAYS Performed at [facility], [address] [place], [place] [address]    Report Status PENDING  Incomplete    Lab Basic Metabolic Panel: Recent Labs  Lab 03/11/19 0500 03/12/19 0518 03/13/19 0030 03/14/19 0500 03/15/19 0606 03/15/19 1227 03/16/19 0545 03/16/19 0750 03/16/19 1707 08-11-18 0259 08-11-18 0843  NA 140 141 143 146* 144 146* 144 140 139 139 137  K 4.5 4.2 4.7 2.7* 5.8* 5.4* 5.1 5.8* 5.7* 6.1* 6.3*  CL 104 102 105 117* 100 104 104  --   --  95*  --   CO2 29 32 33* 25 31 34* 27  --   --  28  --   GLUCOSE 181* 216* 195* 114* 135* 111* 196*  --   --  230*  --   BUN 47* 60* 67* 62* 99* 96* 111*  --   --  96*  --   CREATININE 0.94 1.32* 1.27* 1.06 2.09* 2.37* 2.74*  --   --  4.50*  --   CALCIUM 7.9* 8.1* 8.1* 6.0* 8.2* 7.6* 6.4*  --   --  7.3*  --   MG 2.5* 2.7* 2.9* 2.0 3.0*  --   --   --   --   --   --    Liver Function Tests: Recent Labs  Lab 08-11-18 0259  AST 29  ALT 17  ALKPHOS 72  BILITOT 0.9  PROT 5.7*  ALBUMIN 2.2*   No results for input(s): LIPASE, AMYLASE in the last 168 hours. No results for input(s): AMMONIA in the last 168 hours. CBC: Recent Labs  Lab  03/14/19 0500 03/15/19 0606 03/15/19 1716 03/16/19 0545 03/16/19 0750 03/16/19 1707 02/26/2019 0259 02/25/2019 0843  WBC 6.8 8.8 8.2 7.8  --   --  8.1  --   NEUTROABS  --  6.8  --  6.3  --   --  6.0  --   HGB 9.7* 9.6* 9.2* 8.5* 9.2* 8.5* 7.7* 9.2*  HCT 32.5* 33.2* 31.4* 28.0* 27.0* 25.0* 25.5* 27.0*  MCV 101.2* 103.4* 102.3* 99.3  --   --  98.5  --   PLT 57* 73* 69* 88*  --   --  94*  --    Cardiac Enzymes: No results for input(s): CKTOTAL, CKMB, CKMBINDEX, TROPONINI in the last 168 hours. Sepsis Labs: Recent Labs  Lab 03/15/19 0606 03/15/19 1716  03/16/19 0545 03/21/2019 0259  WBC 8.8 8.2 7.8 8.1    Procedures/Operations  12/18 ETT > 12/19 R pigtail > 12/19 L pigtail > 12/19 LIJ MML > 12/19 bilateral nares packing  Max Fickle 03/05/2019, 4:09 PM

## 2019-03-22 NOTE — Progress Notes (Signed)
[facility] PCCM  The patient's family was able to visit with him today.  Based on prior discussions we now plan to withdraw care. Comfort care orderset written.  Roselie Awkward, MD [place] PCCM Pager: [phone] Cell: [phone] If no response, call [phone]

## 2019-03-22 NOTE — Plan of Care (Signed)
Pt is for withdrawal of care. Alexander Erickson

## 2019-03-22 NOTE — Progress Notes (Signed)
I have spoken with pts son Jeneen Rinks this am x2. The plan is for family visit at 2pm. We have obtained permission from [facility] for pt's 3 children and wife to be present. Belongings have been inventoried. Pt has 1 pair of jeans , 1 pair of crocs, 1 pair of glasses, 1 cell phone, 1 cell phone charger, 4 pocket knives, 1 coat, 1 pair of socks, and 1 shirt. Will document when give belongings to family. Ellamae Sia

## 2019-03-22 NOTE — Progress Notes (Signed)
Pt was extubated at 1532 and asystole at 1539. Pts son Alexander Erickson made aware. Etta Quill.RN

## 2019-03-22 NOTE — Progress Notes (Signed)
NAME:  Alexander Erickson, MRN:  [id number], DOB:  Sep 14, 1955, LOS: 54 ADMISSION DATE:  02/25/2019, CONSULTATION DATE:  12/18 REFERRING MD:  Sloan Leiter, CHIEF COMPLAINT:  Dyspnea   Brief History   64 y./o male admited with acute respiratory failure with hypoxemia due to COVID pneumonia, treated with heated high flow for days; developed hemoptysis and worsening respiratory failure requiring intubation on 12/18.  Past Medical History  Hypertension Hyperlipidemia  Significant Hospital Events   11/30>>COVID test + 12/4>>admit to [facility] via [facility] ED 12/9>>transferred to ICU for heated high flow 12/11>>transferred back to PCU-heated high flow continued  12/17>>suspected to have superimposed bacterial pneumonia on top of COVID-19  12/19>> nasal packing for ongoing nose bleed, bilateral chest tubes placed for pneumothoraces and pneumomediastinum 12/24 replaced right nare packing 12/25 replaced left nare packing  Consults:  PCCM  Procedures:  12/18 ETT >  12/19 R pigtail > 12/19 L pigtail > 12/19 LIJ MML > 12/19 bilateral nares packing  Significant Diagnostic Tests:  12/6>>Doppler positive for right lower extremity DVT 12/9>>echo with no RV strain, but possible IVC thrombus 12/9>>CT venogram chest/abdomen/pelvis-no IVC thrombus   Micro Data:  12/5 Resp > OPF 12/17  12/25 blood >  12/25 resp >   Antimicrobials:  COVID-19 medications: Decadron 12/4 >12/17 Remdesivir 12/4 > 12/13(10-day course) Actemra 12/6 x 1  Antibiotics: Vancomycin 12/17>> Cefepime 12/17>> Rocephin: 12/4>>12/8   Interim history/subjective:   No acute events Labs worse> renal function worse  Objective   Blood pressure 126/60, pulse 83, temperature 98.2 F (36.8 C), temperature source Axillary, resp. rate (!) 30, height 5' 5.5" (1.664 m), weight 92.2 kg, SpO2 96 %. CVP:  [20 mmHg-23 mmHg] 20 mmHg  Vent Mode: PRVC FiO2 (%):  [80 %-90 %] 90 % Set Rate:  [30 bmp-34 bmp] 30 bmp Vt Set:   [430 mL-500 mL] 430 mL PEEP:  [16 cmH20] 16 cmH20 Plateau Pressure:  [38 cmH20-47 cmH20] 38 cmH20   Intake/Output Summary (Last 24 hours) at 03/18/19 0716 Last data filed at 03-18-2019 0600 Gross per 24 hour  Intake 3240.95 ml  Output 387 ml  Net 2853.95 ml   Filed Weights   03/15/19 0500 03/16/19 0500 03/18/2019 0500  Weight: 86.1 kg 87.2 kg 92.2 kg    Examination:  General:  In bed on vent HENT: NCAT nasal packing in place PULM: CTA B, vent supported breathing CV: RRR, no mgr GI: BS+, soft, nontender MSK: normal bulk and tone Neuro: sedated on vent   12/25 CXR improved aeration  Labs reviewed, Cr up significantly, K up  Resolved Hospital Problem list     Assessment & Plan:  COVID 19 pneumonia ARDS due to COVID pneumonia: worsening resp acidosis 12/25 in pressure control, moved back to [facility], remains profoundly hypoxemic 12/26, little signs of progress now complicated by multi-organ failure Need for sedation for mechanical ventilation: Acute on chronic kidney diseae with oliguria> worse 12/27, not a good candidate for hemodialysis New shock with resp and metabolic acidosis 22/29 ,septic shock? Bilateral pneumothoraces Epistaxis: worse in prone position and with blood thinners DVT DM2  Anemia with epistaxis  At this point the patient's family desires to withdraw care and to make him comfortable.  Plan Continue current sedatives Continue current mechanical ventilation settings Family visit at 1400 today Palliative extubation after family visit Continue mouth care Use diluadid and versed for comfort measures after extubation  Cc time 30 minutes  Roselie Awkward, MD [place] PCCM Pager: [phone] Cell: [phone] If no response, call [phone]

## 2019-03-22 NOTE — Progress Notes (Signed)
Pt family visit concluded. Pt s family very appreciative of care patient has received. Pt;s belongings as listed in prior note given to patients children . RN also gave family 4 copies of pts handprints . Alexander Erickson

## 2019-03-22 DEATH — deceased

## 2020-01-15 IMAGING — DX DG CHEST 1V
1 series · 1 of 1 positions shown · non-contrast
Comparison: March 11, 2019

CLINICAL DATA: Hypoxia

EXAM:
CHEST  1 VIEW

[chest ap]
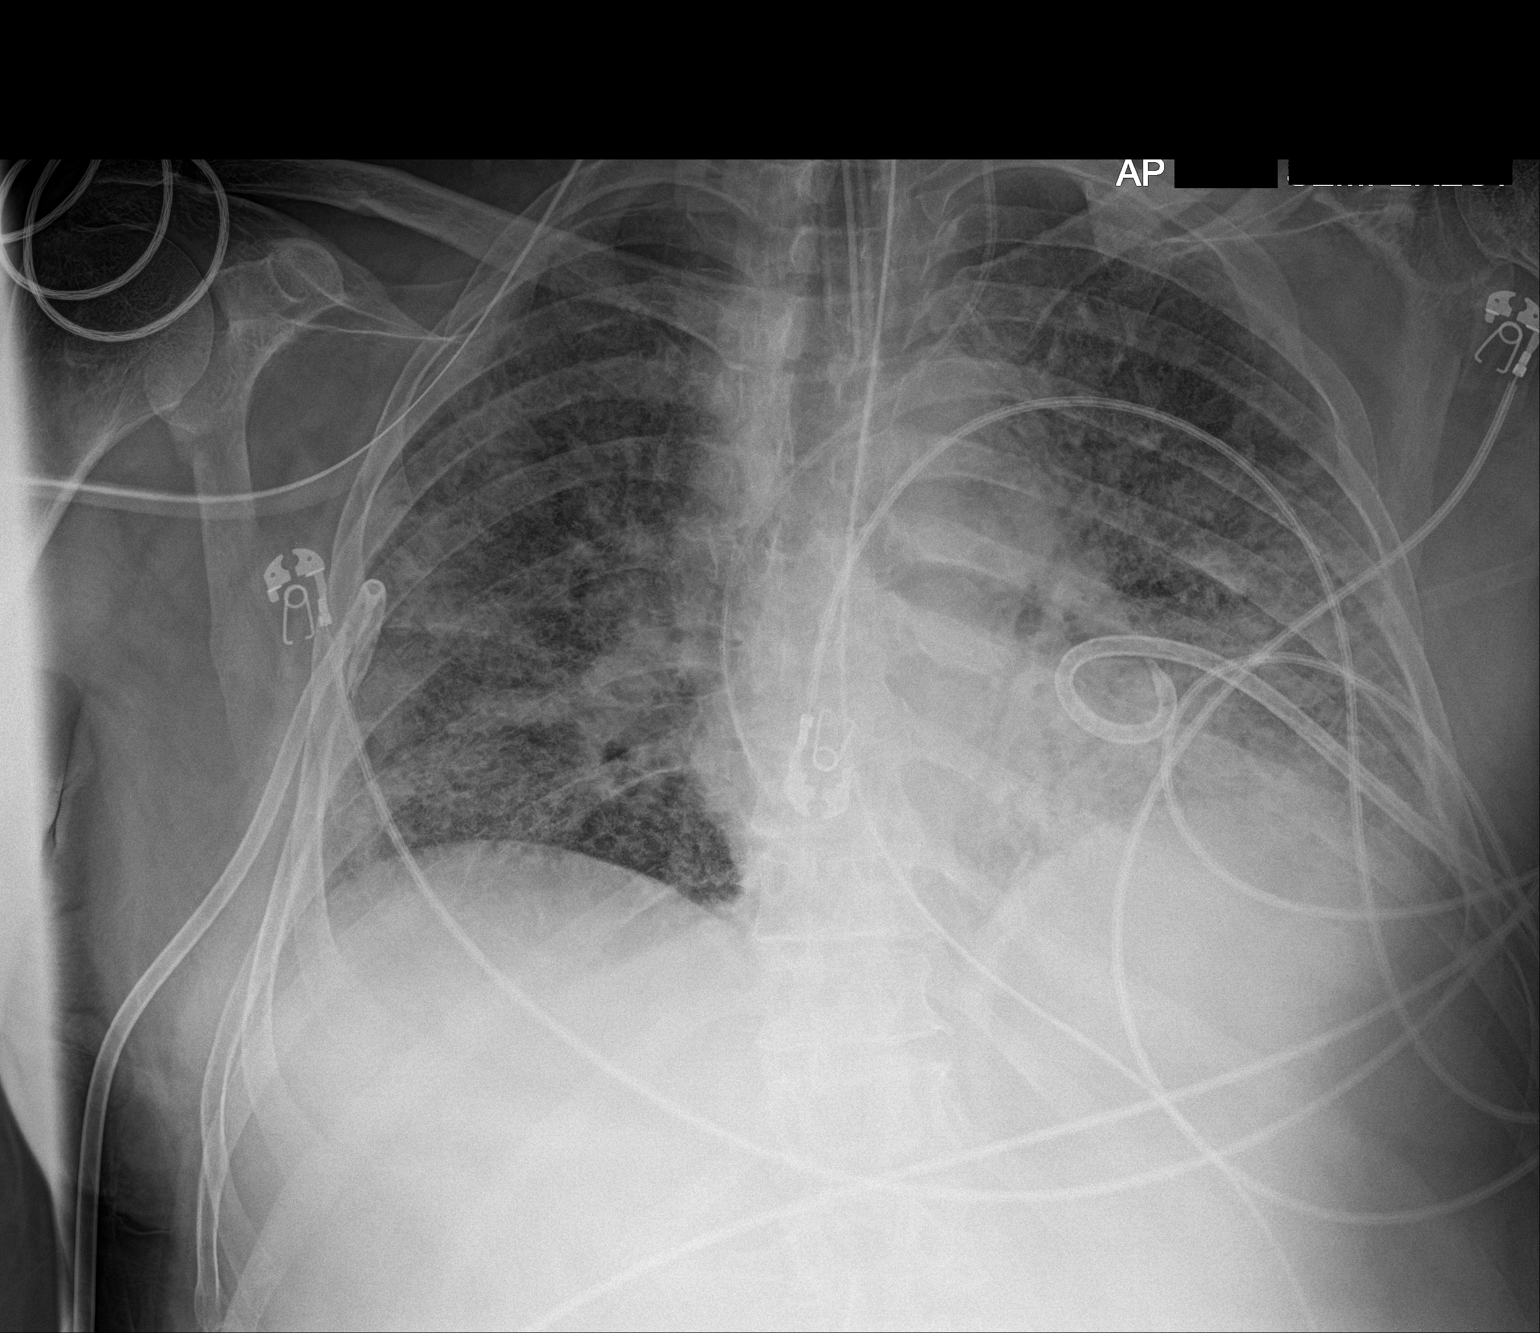

[1 of 1 positions shown; findings below may reference images not displayed]

FINDINGS: Endotracheal tube tip is 3.8 cm above the carina. Nasogastric tube
tip and side port are below the diaphragm. Central catheter tip is
at the cavoatrial junction. Chest tubes are present on each side,
unchanged in position. No pneumothorax. There is airspace opacity
bilaterally, essentially stable compared to 1 day prior. No new
opacity evident. Heart size and pulmonary vascular normal. No
adenopathy. No bone lesions.
IMPRESSION: Tube and catheter positions as described without evident
pneumothorax. Extensive airspace opacity bilaterally is essentially
stable compared to 1 day prior allowing for slight differences in
technique. Stable cardiac silhouette.
